# Patient Record
Sex: Male | Born: 1965 | Race: Black or African American | Hispanic: No | Marital: Single | Smoking: Never smoker
Health system: Southern US, Community
[De-identification: ages and names within clinical notes are randomized; demographics above are authoritative.]

## PROBLEM LIST (undated history)

## (undated) DIAGNOSIS — E669 Obesity, unspecified: Secondary | ICD-10-CM

## (undated) DIAGNOSIS — I10 Essential (primary) hypertension: Secondary | ICD-10-CM

## (undated) DIAGNOSIS — E785 Hyperlipidemia, unspecified: Secondary | ICD-10-CM

## (undated) DIAGNOSIS — N183 Chronic kidney disease, stage 3 unspecified: Secondary | ICD-10-CM

## (undated) DIAGNOSIS — I2699 Other pulmonary embolism without acute cor pulmonale: Secondary | ICD-10-CM

## (undated) HISTORY — DX: Chronic kidney disease, stage 3 unspecified: N18.30

## (undated) HISTORY — DX: Obesity, unspecified: E66.9

## (undated) HISTORY — PX: APPENDECTOMY: SHX54

## (undated) HISTORY — DX: Other pulmonary embolism without acute cor pulmonale: I26.99

## (undated) HISTORY — DX: Hyperlipidemia, unspecified: E78.5

---

## 2013-12-01 ENCOUNTER — Encounter (HOSPITAL_BASED_OUTPATIENT_CLINIC_OR_DEPARTMENT_OTHER): Payer: Self-pay | Admitting: Emergency Medicine

## 2013-12-01 ENCOUNTER — Emergency Department (HOSPITAL_BASED_OUTPATIENT_CLINIC_OR_DEPARTMENT_OTHER)
Admission: EM | Admit: 2013-12-01 | Discharge: 2013-12-02 | Disposition: A | Discharge status: Home / Self Care | Payer: BC Managed Care – PPO | Attending: Emergency Medicine | Admitting: Emergency Medicine

## 2013-12-01 DIAGNOSIS — Z79899 Other long term (current) drug therapy: Secondary | ICD-10-CM | POA: Insufficient documentation

## 2013-12-01 DIAGNOSIS — I10 Essential (primary) hypertension: Secondary | ICD-10-CM

## 2013-12-01 DIAGNOSIS — Z760 Encounter for issue of repeat prescription: Secondary | ICD-10-CM | POA: Insufficient documentation

## 2013-12-01 HISTORY — DX: Essential (primary) hypertension: I10

## 2013-12-01 MED ORDER — AMLODIPINE BESYLATE 10 MG PO TABS: 10.0000 mg | ORAL_TABLET | Freq: Every day | ORAL | Status: DC

## 2013-12-01 NOTE — ED Notes (Signed)
Pt c/o increased BP x 1 day. Pt is out of BP med

## 2013-12-01 NOTE — Discharge Instructions (Signed)
Arterial Hypertension °Arterial hypertension (high blood pressure) is a condition of elevated pressure in your blood vessels. Hypertension over a long period of time is a risk factor for strokes, heart attacks, and heart failure. It is also the leading cause of kidney (renal) failure.  °CAUSES  °· In Adults -- Over 90% of all hypertension has no known cause. This is called essential or primary hypertension. In the other 10% of people with hypertension, the increase in blood pressure is caused by another disorder. This is called secondary hypertension. Important causes of secondary hypertension are: °· Heavy alcohol use. °· Obstructive sleep apnea. °· Hyperaldosterosim (Conn's syndrome). °· Steroid use. °· Chronic kidney failure. °· Hyperparathyroidism. °· Medications. °· Renal artery stenosis. °· Pheochromocytoma. °· Cushing's disease. °· Coarctation of the aorta. °· Scleroderma renal crisis. °· Licorice (in excessive amounts). °· Drugs (cocaine, methamphetamine). °Your caregiver can explain any items above that apply to you. °· In Children -- Secondary hypertension is more common and should always be considered. °· Pregnancy -- Few women of childbearing age have high blood pressure. However, up to 10% of them develop hypertension of pregnancy. Generally, this will not harm the woman. It may be a sign of 3 complications of pregnancy: preeclampsia, HELLP syndrome, and eclampsia. Follow up and control with medication is necessary. °SYMPTOMS  °· This condition normally does not produce any noticeable symptoms. It is usually found during a routine exam. °· Malignant hypertension is a late problem of high blood pressure. It may have the following symptoms: °· Headaches. °· Blurred vision. °· End-organ damage (this means your kidneys, heart, lungs, and other organs are being damaged). °· Stressful situations can increase the blood pressure. If a person with normal blood pressure has their blood pressure go up while being  seen by their caregiver, this is often termed "white coat hypertension." Its importance is not known. It may be related with eventually developing hypertension or complications of hypertension. °· Hypertension is often confused with mental tension, stress, and anxiety. °DIAGNOSIS  °The diagnosis is made by 3 separate blood pressure measurements. They are taken at least 1 week apart from each other. If there is organ damage from hypertension, the diagnosis may be made without repeat measurements. °Hypertension is usually identified by having blood pressure readings: °· Above 140/90 mmHg measured in both arms, at 3 separate times, over a couple weeks. °· Over 130/80 mmHg should be considered a risk factor and may require treatment in patients with diabetes. °Blood pressure readings over 120/80 mmHg are called "pre-hypertension" even in non-diabetic patients. °To get a true blood pressure measurement, use the following guidelines. Be aware of the factors that can alter blood pressure readings. °· Take measurements at least 1 hour after caffeine. °· Take measurements 30 minutes after smoking and without any stress. This is another reason to quit smoking  it raises your blood pressure. °· Use a proper cuff size. Ask your caregiver if you are not sure about your cuff size. °· Most home blood pressure cuffs are automatic. They will measure systolic and diastolic pressures. The systolic pressure is the pressure reading at the start of sounds. Diastolic pressure is the pressure at which the sounds disappear. If you are elderly, measure pressures in multiple postures. Try sitting, lying or standing. °· Sit at rest for a minimum of 5 minutes before taking measurements. °· You should not be on any medications like decongestants. These are found in many cold medications. °· Record your blood pressure readings and review   them with your caregiver. °If you have hypertension: °· Your caregiver may do tests to be sure you do not have  secondary hypertension (see "causes" above). °· Your caregiver may also look for signs of metabolic syndrome. This is also called Syndrome X or Insulin Resistance Syndrome. You may have this syndrome if you have type 2 diabetes, abdominal obesity, and abnormal blood lipids in addition to hypertension. °· Your caregiver will take your medical and family history and perform a physical exam. °· Diagnostic tests may include blood tests (for glucose, cholesterol, potassium, and kidney function), a urinalysis, or an EKG. Other tests may also be necessary depending on your condition. °PREVENTION  °There are important lifestyle issues that you can adopt to reduce your chance of developing hypertension: °· Maintain a normal weight. °· Limit the amount of salt (sodium) in your diet. °· Exercise often. °· Limit alcohol intake. °· Get enough potassium in your diet. Discuss specific advice with your caregiver. °· Follow a DASH diet (dietary approaches to stop hypertension). This diet is rich in fruits, vegetables, and low-fat dairy products, and avoids certain fats. °PROGNOSIS  °Essential hypertension cannot be cured. Lifestyle changes and medical treatment can lower blood pressure and reduce complications. The prognosis of secondary hypertension depends on the underlying cause. Many people whose hypertension is controlled with medicine or lifestyle changes can live a normal, healthy life.  °RISKS AND COMPLICATIONS  °While high blood pressure alone is not an illness, it often requires treatment due to its short- and long-term effects on many organs. Hypertension increases your risk for: °· CVAs or strokes (cerebrovascular accident). °· Heart failure due to chronically high blood pressure (hypertensive cardiomyopathy). °· Heart attack (myocardial infarction). °· Damage to the retina (hypertensive retinopathy). °· Kidney failure (hypertensive nephropathy). °Your caregiver can explain list items above that apply to you. Treatment  of hypertension can significantly reduce the risk of complications. °TREATMENT  °· For overweight patients, weight loss and regular exercise are recommended. Physical fitness lowers blood pressure. °· Mild hypertension is usually treated with diet and exercise. A diet rich in fruits and vegetables, fat-free dairy products, and foods low in fat and salt (sodium) can help lower blood pressure. Decreasing salt intake decreases blood pressure in a 1/3 of people. °· Stop smoking if you are a smoker. °The steps above are highly effective in reducing blood pressure. While these actions are easy to suggest, they are difficult to achieve. Most patients with moderate or severe hypertension end up requiring medications to bring their blood pressure down to a normal level. There are several classes of medications for treatment. Blood pressure pills (antihypertensives) will lower blood pressure by their different actions. Lowering the blood pressure by 10 mmHg may decrease the risk of complications by as much as 25%. °The goal of treatment is effective blood pressure control. This will reduce your risk for complications. Your caregiver will help you determine the best treatment for you according to your lifestyle. What is excellent treatment for one person, may not be for you. °HOME CARE INSTRUCTIONS  °· Do not smoke. °· Follow the lifestyle changes outlined in the "Prevention" section. °· If you are on medications, follow the directions carefully. Blood pressure medications must be taken as prescribed. Skipping doses reduces their benefit. It also puts you at risk for problems. °· Follow up with your caregiver, as directed. °· If you are asked to monitor your blood pressure at home, follow the guidelines in the "Diagnosis" section above. °SEEK MEDICAL CARE   IF:  °· You think you are having medication side effects. °· You have recurrent headaches or lightheadedness. °· You have swelling in your ankles. °· You have trouble with  your vision. °SEEK IMMEDIATE MEDICAL CARE IF:  °· You have sudden onset of chest pain or pressure, difficulty breathing, or other symptoms of a heart attack. °· You have a severe headache. °· You have symptoms of a stroke (such as sudden weakness, difficulty speaking, difficulty walking). °MAKE SURE YOU:  °· Understand these instructions. °· Will watch your condition. °· Will get help right away if you are not doing well or get worse. °Document Released: 10/28/2005 Document Revised: 01/20/2012 Document Reviewed: 05/28/2007 °ExitCare® Patient Information ©2014 [employer]. ° ° ° °Emergency Department Resource Guide °1) Find a Doctor and Pay Out of Pocket °Although you won't have to find out who is covered by your insurance plan, it is a good idea to ask around and get recommendations. You will then need to call the office and see if the doctor you have chosen will accept you as a new patient and what types of options they offer for patients who are self-pay. Some doctors offer discounts or will set up payment plans for their patients who do not have insurance, but you will need to ask so you aren't surprised when you get to your appointment. ° °2) Contact Your Local Health Department °Not all health departments have doctors that can see patients for sick visits, but many do, so it is worth a call to see if yours does. If you don't know where your local health department is, you can check in your phone book. The CDC also has a tool to help you locate your state's health department, and many state websites also have listings of all of their local health departments. ° °3) Find a Walk-in Clinic °If your illness is not likely to be very severe or complicated, you may want to try a walk in clinic. These are popping up all over the country in pharmacies, drugstores, and shopping centers. They're usually staffed by nurse practitioners or physician assistants that have been trained to treat common illnesses and complaints.  They're usually fairly quick and inexpensive. However, if you have serious medical issues or chronic medical problems, these are probably not your best option. ° °No Primary Care Doctor: °- Call [facility] at  832-8000 - they can help you locate a primary care doctor that  accepts your insurance, provides certain services, etc. °- Physician Referral Service- 1-800-533-3463 ° °Chronic Pain Problems: °Organization         Address  Phone   Notes  °[facility]  (336) 297-2271 Patients need to be referred by their primary care doctor.  ° °Medication Assistance: °Organization         Address  Phone   Notes  °[place] [facility] [address] °[place], [place] [address] (336) 641-8030 --Must be a resident of [place] °-- Must have NO insurance coverage whatsoever (no Medicaid/ Medicare, etc.) °-- The pt. MUST have a primary care doctor that directs their care regularly and follows them in the community °  °[facility]  (866) 331-1348   °[employer]  (888) 892-1162   ° °Agencies that provide inexpensive medical care: °Organization         Address  Phone   Notes  °[facility]  (336) 832-8035   °[facility]    (336) 832-7272   °  [facility] [address] °[place], [place] [address] (336) 832-4777   °[facility] [address], °[place] (336) 271-4999   °[facility]    (336) 373-0678   °[facility]    (336) 272-1050   °[facility] ° [address], [place] Phone:  (336) 832-4444, Fax:  (336) 832-4440 Hours of Operation:  9 am - 6 pm, M-F.  Also accepts Medicaid/Medicare and self-pay.  °[facility] ° [address], [place] Phone: (336) [phone], Fax: (336) [phone]. Hours of Operation:  8:30 am - 5:30 pm, M-F.  Also accepts Medicaid and self-pay.  °[facility] [place] [address], [place]  Phone: (336) 878-6027   °[facility] [address], [place], [place] (336)723-1848, Ext. 123 Mondays & Thursdays: 7-9 AM.  First 15 patients are seen on a first come, first serve basis. °  ° °Medicaid-accepting [place] Providers: ° °Organization         Address  Phone   Notes  °[facility] [address], [place] (336) 641-2100 Also accepts self-pay patients.  °[facility] [address], [place] ° (336) 856-9996   °[facility] [address], [place] (336) 288-8857   °[facility] [address] [place] [address], [place] (336) 299-7000   °Veita Bland [address], [place]  ° (336) 373-1557 Only accepts [place] Access Medicaid patients after they have their name applied to their card.  ° °Self-Pay (no insurance) in [place]: ° °Organization         Address  Phone   Notes  °Sickle Cell Patients, [facility] [address], [place] (336) 832-1970   °[facility] [address], [place] (336) 832-4400   °[facility] ° [address], [place] (336) 992-4800   °[facility]/Dr. Osei-Bonsu ° [address] [place] [address], [place] or [address], [place] (336) [phone] Phone number for both [place] and [place] locations is the same.  °[facility] [address], [place] (336) 299-0000   °[facility] [address] [place] [address], [place] or [address] (336) 852-7530 °(336) 878-2260   °[facility] [address], [place] (336) 350-1642, phone; (336) 294-5005, fax Sees patients 1st and 3rd Saturday of every month.  Must not qualify for public or private insurance (i.e. Medicaid, Medicare, [place] Health Choice, Veterans' Benefits) • Household income should be no more than 200% of the poverty level •The clinic cannot  treat you if you are pregnant or think you are pregnant • Sexually transmitted diseases are not treated at the clinic.  ° ° °Dental Care: °Organization         Address  Phone  Notes  °[facility] [address],  (336) 641-6152 Accepts children up to age 21 who are enrolled in Medicaid or [place] Health Choice; pregnant women with a Medicaid card; and children who have applied for Medicaid or [place] Health Choice, but were declined, whose parents can pay a reduced fee at time of service.  °[facility] [place]  [address], [place] (336) 641-7733 Accepts children up to age 21 who   are enrolled in Medicaid or [place] Health Choice; pregnant women with a Medicaid card; and children who have applied for Medicaid or [place] Health Choice, but were declined, whose parents can pay a reduced fee at time of service.  °[facility] ° [address], [place] (336) 641-4533 Patients are seen by appointment only. Walk-ins are not accepted. [facility] will see patients 18 years of age and older. °Monday - Tuesday (8am-5pm) °Most Wednesdays (8:30-5pm) °$30 per visit, cash only  °[facility] ° [address], [place] (336) 641-4533 Patients are seen by appointment only. Walk-ins are not accepted. [facility] will see patients 18 years of age and older. °One Wednesday Evening (Monthly: Volunteer Based).  $30 per visit, cash only  °[facility]  (919) 537-3737 for adults; Children under age 4, call Graduate Pediatric Dentistry at ([phone]) [phone]. Children aged 4-14, please call ([phone]) [phone] to request a pediatric application. ° Dental services are provided in all areas of dental care including fillings, crowns and bridges, complete and partial dentures, implants, gum treatment, root canals, and extractions. Preventive care is also provided.  Treatment is provided to both adults and children. °Patients are selected via a lottery and there is often a waiting list. °  °[facility] [address], °[place] ° (336) 763-8833 [email or web address] °  °[facility] [address], [place], [place] (336)723-1848, Ext. 123 Second and Fourth Thursday of each month, opens at 6:30 AM; Clinic ends at 9 AM.  Patients are seen on a first-come first-served basis, and a limited number are seen during each clinic.  ° °[facility] ° [address], [place], [place] (336) 723-7904   Eligibility Requirements °You must have lived in [place], [place], or [place] counties for at least the last three months. °  You cannot be eligible for state or federal sponsored healthcare insurance, including Veterans Administration, Medicaid, or Medicare. °  You generally cannot be eligible for healthcare insurance through your employer.  °  How to apply: °Eligibility screenings are held every Tuesday and Wednesday afternoon from 1:00 pm until 4:00 pm. You do not need an appointment for the interview!  °[facility] [address], [place], [place] 336-631-2330   °[facility]  336-342-8273   °[facility]  336-703-3100   °[facility]  336-570-6415   ° °Behavioral Health Resources in the Community: °Intensive Outpatient Programs °Organization         Address  Phone  Notes  °[facility] [address], [place], [place] 336-878-6098   °[facility] [address], [place], [place] 336-832-9800   °[facility] [address], [place], [place] ° 336-882-2125   °[facility] [address],  °[place], [place] 1-800-853-5163 or 336-641-4981   °Substance Abuse Resources °Organization         Address  Phone  Notes  °[facility]  336-882-2125   °[facility]   336-784-9470   °[facility]  336-285-9073   °[facility]  336-845-3988   °Residential & Outpatient Substance Abuse Program  1-800-659-3381   °Psychological Services °Organization         Address  Phone  Notes  °[facility]  336- 832-9600   °[facility]  336- 378-7881   °[facility] [address],   [place] 1-800-853-5163 or 336-641-4981   ° °Mobile Crisis Teams °Organization         Address  Phone  Notes  °[facility], Mobile Crisis Care Unit  1-877-626-1772   °Assertive °[facility] ° [address] [place], [place] 336-834-9664   °Sharon DeEsch [address] °[place] 336-554-5454   ° °Self-Help/Support Groups °Organization         Address  Phone             Notes  °[facility] - variety of support groups  336- 373-1402 Call for more information  °Narcotics Anonymous (NA), [facility] [address], °[place]  2 meetings at this location  ° °Residential Treatment Programs °Organization         Address  Phone  Notes  °[facility] [address],    °[place]  1-866-801-8205   °[facility] ° [address], [place], [place] 704-293-8524   °[facility] [address], [place] 336-845-3988 Admissions: 8am-3pm M-F  °[facility] [address]    °[place], [place] 336-841-1104   °[facility] [address], [place], [place] 336-379-7146   °[facility] [address]  °[place], [place] 336-285-9073   °[facility] - Intensive Outpatient [address], [place], [place] 336-852-3033   °[facility] [address]  °[place], [place] 1-877-615-2722 or 336-784-9470   °[facility] [address] [place], [place] 336-227-7417 Accepts Medicaid  °[facility] [address]  ° [place] 1-800-659-3381 Substance  Abuse/Addiction Treatment  ° °[place] Behavioral Health Resources °Organization         Address  Phone  Notes  °[facility]  (888) 581-9988   °Julie Brannon, PhD [address] [place], [place]   (336) 349-5553 or (336) 951-0000   °[facility]   [address] °[place], [place] (336) 349-4454   °[facility] [address], [place], [place] (336) 342-8316 Insurance/Medicaid/sponsorship through [facility]  °[facility] [address]                                    [place], [place] (336) 342-8316 Therapy/tele-psych/case  °[facility] [address]  ° [place], [place] (336) 349-2233    °Dr. Arfeen  (336) 349-4544   °[facility] [place]  [employer] [place] [facility] 1) [address], [place] °2) [address], [place] °3)  [address], [place] (336) 349-3220 °(336) 342-7768 ° °(336) 342-8140   °[place] [facility] (336) 342-1394 or (336) 342-3537 (After Hours)    ° ° ° °

## 2013-12-01 NOTE — ED Provider Notes (Signed)
CSN: [id number]     Arrival date & time 12/01/13  2214 History  This chart was scribed for Andres Verge Smitty Cords, MD by Quintella Reichert, ED scribe.  This patient was seen in room MH12/MH12 and the patient's care was started at 11:53 PM.   Chief Complaint  Patient presents with  . Hypertension    Patient is a 48 y.o. male presenting with hypertension. The history is provided by the patient. No language interpreter was used.  Hypertension This is a chronic problem. The current episode started more than 1 week ago. The problem occurs constantly. The problem has not changed since onset.Pertinent negatives include no chest pain, no abdominal pain, no headaches and no shortness of breath. Nothing aggravates the symptoms. Nothing relieves the symptoms. Treatments tried: A few tablets of old BP medication this evening. The treatment provided no relief.    HPI Comments: Andres Roman is a 48 y.o. male who presents to the Emergency Department complaining of HTN.  Pt has h/o HTN and was taking amlodipine.  He has no PCP and has been out for several months.  He states he took a small amount of old BP medications this evening at around 6 PM but his BP did not go down.  BP on arrival is 188/111.   Past Medical History  Diagnosis Date  . Hypertension     Past Surgical History  Procedure Laterality Date  . Appendectomy      History reviewed. No pertinent family history.   History  Substance Use Topics  . Smoking status: Never Smoker   . Smokeless tobacco: Not on file  . Alcohol Use: No     Review of Systems  Respiratory: Negative for shortness of breath.   Cardiovascular: Negative for chest pain, palpitations and leg swelling.  Gastrointestinal: Negative for abdominal pain.  Neurological: Negative for headaches.  All other systems reviewed and are negative.     Allergies  Review of patient's allergies indicates no known allergies.  Home Medications   Current Outpatient Rx  Name   Route  Sig  Dispense  Refill  . amLODipine (NORVASC) 10 MG tablet   Oral   Take 10 mg by mouth daily.          BP 168/109  Pulse 82  Temp(Src) 98.1 F (36.7 C) (Oral)  Resp 16  Ht 5\' 6"  (1.676 m)  Wt 215 lb (97.523 kg)  BMI 34.72 kg/m2  SpO2 99%  Physical Exam  Nursing note and vitals reviewed. Constitutional: He is oriented to person, place, and time. He appears well-developed and well-nourished. No distress.  HENT:  Head: Normocephalic and atraumatic.  Mouth/Throat: Oropharynx is clear and moist.  Eyes: EOM are normal. Pupils are equal, round, and reactive to light.  Neck: Neck supple. No tracheal deviation present.  Cardiovascular: Normal rate, regular rhythm and intact distal pulses.   Pulmonary/Chest: Effort normal and breath sounds normal. No respiratory distress. He has no wheezes. He has no rales.  Abdominal: Soft. Bowel sounds are normal. There is no tenderness.  Musculoskeletal: Normal range of motion.  Neurological: He is alert and oriented to person, place, and time. He has normal strength and normal reflexes. No cranial nerve deficit.  Skin: Skin is warm and dry.  Psychiatric: He has a normal mood and affect. His behavior is normal.    ED Course  Procedures (including critical care time)  DIAGNOSTIC STUDIES: Oxygen Saturation is 99% on room air, normal by my interpretation.    COORDINATION OF  CARE: 11:56 PM-Discussed treatment plan which includes referral for PCP f/u with pt at bedside and pt agreed to plan.    Labs Review Labs Reviewed - No data to display  Imaging Review No results found.  EKG Interpretation   None       MDM  No diagnosis found. No symptoms will provide one month supply of meds but patient must find a PMD for ongoing care     I personally performed the services described in this documentation, which was scribed in my presence. The recorded information has been reviewed and is accurate.    Andres AweApril K Mallerie Blok-Rasch,  MD 12/02/13 [phone]

## 2013-12-24 ENCOUNTER — Ambulatory Visit: Payer: BC Managed Care – PPO | Admitting: Family Medicine

## 2013-12-30 ENCOUNTER — Encounter: Payer: Self-pay | Admitting: Family Medicine

## 2013-12-30 ENCOUNTER — Ambulatory Visit (INDEPENDENT_AMBULATORY_CARE_PROVIDER_SITE_OTHER): Payer: BC Managed Care – PPO | Admitting: Family Medicine

## 2013-12-30 VITALS — BP 151/101 | HR 81 | Ht 66.0 in | Wt 218.0 lb

## 2013-12-30 DIAGNOSIS — E785 Hyperlipidemia, unspecified: Secondary | ICD-10-CM

## 2013-12-30 DIAGNOSIS — R5381 Other malaise: Secondary | ICD-10-CM

## 2013-12-30 DIAGNOSIS — R5383 Other fatigue: Secondary | ICD-10-CM

## 2013-12-30 DIAGNOSIS — Z125 Encounter for screening for malignant neoplasm of prostate: Secondary | ICD-10-CM

## 2013-12-30 DIAGNOSIS — E559 Vitamin D deficiency, unspecified: Secondary | ICD-10-CM

## 2013-12-30 DIAGNOSIS — I10 Essential (primary) hypertension: Secondary | ICD-10-CM

## 2013-12-30 LAB — COMPLETE METABOLIC PANEL WITH GFR
ALT: 31 U/L (ref 0–53)
AST: 30 U/L (ref 0–37)
Albumin: 4.5 g/dL (ref 3.5–5.2)
Alkaline Phosphatase: 65 U/L (ref 39–117)
BUN: 14 mg/dL (ref 6–23)
CO2: 25 mEq/L (ref 19–32)
Calcium: 9.4 mg/dL (ref 8.4–10.5)
Chloride: 102 mEq/L (ref 96–112)
Creat: 0.89 mg/dL (ref 0.50–1.35)
GFR, Est African American: >89 mL/min
GFR, Est Non African American: >89 mL/min
Glucose, Bld: 79 mg/dL (ref 70–99)
Potassium: 3.6 mEq/L (ref 3.5–5.3)
Sodium: 138 mEq/L (ref 135–145)
Total Bilirubin: 1 mg/dL (ref 0.2–1.2)
Total Protein: 8.1 g/dL (ref 6.0–8.3)

## 2013-12-30 LAB — CBC WITH DIFFERENTIAL/PLATELET
Basophils Absolute: 0.1 10*3/uL (ref 0.0–0.1)
Basophils Relative: 1 % (ref 0–1)
Eosinophils Absolute: 0.3 10*3/uL (ref 0.0–0.7)
Eosinophils Relative: 5 % (ref 0–5)
HCT: 44.8 % (ref 39.0–52.0)
Hemoglobin: 15.2 g/dL (ref 13.0–17.0)
Lymphocytes Relative: 36 % (ref 12–46)
Lymphs Abs: 2.3 10*3/uL (ref 0.7–4.0)
MCH: 27.4 pg (ref 26.0–34.0)
MCHC: 33.9 g/dL (ref 30.0–36.0)
MCV: 80.7 fL (ref 78.0–100.0)
Monocytes Absolute: 0.6 10*3/uL (ref 0.1–1.0)
Monocytes Relative: 10 % (ref 3–12)
Neutro Abs: 3 10*3/uL (ref 1.7–7.7)
Neutrophils Relative %: 48 % (ref 43–77)
Platelets: 302 10*3/uL (ref 150–400)
RBC: 5.55 MIL/uL (ref 4.22–5.81)
RDW: 14.3 % (ref 11.5–15.5)
WBC: 6.3 10*3/uL (ref 4.0–10.5)

## 2013-12-30 LAB — TSH: TSH: 1.038 u[IU]/mL (ref 0.350–4.500)

## 2013-12-30 LAB — LIPID PANEL
Cholesterol: 256 mg/dL — ABNORMAL HIGH (ref 0–200)
HDL: 45 mg/dL (ref >39)
LDL Cholesterol: 184 mg/dL — ABNORMAL HIGH (ref 0–99)
Total CHOL/HDL Ratio: 5.7 Ratio
Triglycerides: 133 mg/dL (ref <150)
VLDL: 27 mg/dL (ref 0–40)

## 2013-12-30 LAB — PSA: PSA: 1.52 ng/mL (ref <=4.00)

## 2013-12-30 LAB — MICROALBUMIN, URINE: Microalb, Ur: 15.13 mg/dL — ABNORMAL HIGH (ref 0.00–1.89)

## 2013-12-30 MED ORDER — AMLODIPINE BESYLATE 10 MG PO TABS: 10.0000 mg | ORAL_TABLET | Freq: Every day | ORAL | Status: DC

## 2013-12-30 MED ORDER — HYDROCHLOROTHIAZIDE 25 MG PO TABS: 25.0000 mg | ORAL_TABLET | Freq: Every day | ORAL | Status: DC

## 2013-12-30 NOTE — Progress Notes (Signed)
Subjective:    Patient ID: Andres Roman, male    DOB: 1966-09-18, 48 y.o.   MRN: [id number]  HPI  Andres Roman is here today to establish care with our practice.  He found our name in the Lobby of the [facility].  [facility] [place].  He has been on both BP and cholesterol medication in the past. Recently, he checked his BP on his wife's BP machine and noted that it was very high.  This concerned him so he went to the ED to be evaluated.  He was given amlodipine 10 mg and was told to establish care with a PCP.  He does not have any other medical concerns today.     Review of Systems  Constitutional: Negative for fatigue and unexpected weight change.  HENT: Negative.   Eyes: Negative for visual disturbance.  Respiratory: Negative for shortness of breath.   Cardiovascular: Negative for chest pain, palpitations and leg swelling.  Gastrointestinal: Negative.   Genitourinary: Negative.   Musculoskeletal: Negative for myalgias.  Skin: Negative.   Neurological: Negative.  Negative for light-headedness and headaches.  Psychiatric/Behavioral: Negative.      Past Medical History  Diagnosis Date  . Hypertension   . Hyperlipidemia   . Obese      Past Surgical History  Procedure Laterality Date  . 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     Hobbies:  Basketball                   Family History  Problem Relation Age of Onset  . Diabetes Mother   . Hypertension Mother   . Stroke Mother   .  Hypertension Brother   . Diabetes Brother   . Heart disease Paternal Grandmother     No Known Allergies      Objective:   Physical Exam  Constitutional: He is oriented to person, place, and time. He appears well-nourished. No distress.  HENT:  Head: Normocephalic.  Eyes: No scleral icterus.  Neck: Neck supple. No thyromegaly present.  Cardiovascular: Normal rate, regular rhythm and normal heart sounds.  Exam reveals no gallop and no friction rub.   No murmur heard. Pulmonary/Chest: Breath sounds normal. No respiratory distress. He exhibits no tenderness.  Musculoskeletal: He exhibits no edema.  Neurological: He is alert and oriented to person, place, and time.  Skin: Skin is warm and dry. No rash noted.  Psychiatric: He has a normal mood and affect. His behavior is normal. Judgment and thought content normal.      Assessment & Plan:    Andres Roman was seen today for hypertension.  Diagnoses and associated orders for this visit:  Essential hypertension, benign - amLODipine (NORVASC) 10 MG tablet; Take 1 tablet (10 mg total) by mouth daily. - hydrochlorothiazide (HYDRODIURIL) 25 MG tablet; Take 1 tablet (25 mg total) by mouth daily. - Microalbumin, urine  Other and unspecified hyperlipidemia - COMPLETE METABOLIC PANEL WITH GFR - Lipid panel  Screening PSA (prostate specific antigen) - PSA  Other malaise and fatigue - CBC w/Diff - TSH  Unspecified vitamin D deficiency - Vit D  25 hydroxy (rtn osteoporosis monitoring)   TIME SPENT "FACE TO FACE" WITH PATIENT -  30 MINS

## 2013-12-30 NOTE — Patient Instructions (Signed)
1)  Blood Pressure - Take the combination of amlodipine 10 mg and HCTZ 25 mg first thing in the morning.  Things you can do to limit BP would include DASH Diet and limiting your sodium intake.     DASH Diet The DASH diet stands for "Dietary Approaches to Stop Hypertension." It is a healthy eating plan that has been shown to reduce high blood pressure (hypertension) in as little as 14 days, while also possibly providing other significant health benefits. These other health benefits include reducing the risk of breast cancer after menopause and reducing the risk of type 2 diabetes, heart disease, colon cancer, and stroke. Health benefits also include weight loss and slowing kidney failure in patients with chronic kidney disease.  DIET GUIDELINES  Limit salt (sodium). Your diet should contain less than 1500 mg of sodium daily.  Limit refined or processed carbohydrates. Your diet should include mostly whole grains. Desserts and added sugars should be used sparingly.  Include small amounts of heart-healthy fats. These types of fats include nuts, oils, and tub margarine. Limit saturated and trans fats. These fats have been shown to be harmful in the body. CHOOSING FOODS  The following food groups are based on a 2000 calorie diet. See your Registered Dietitian for individual calorie needs. Grains and Grain Products (6 to 8 servings daily)  Eat More Often: Whole-wheat bread, brown rice, whole-grain or wheat pasta, quinoa, popcorn without added fat or salt (air popped).  Eat Less Often: White bread, white pasta, white rice, cornbread. Vegetables (4 to 5 servings daily)  Eat More Often: Fresh, frozen, and canned vegetables. Vegetables may be raw, steamed, roasted, or grilled with a minimal amount of fat.  Eat Less Often/Avoid: Creamed or fried vegetables. Vegetables in a cheese sauce. Fruit (4 to 5 servings daily)  Eat More Often: All fresh, canned (in natural juice), or frozen fruits. Dried fruits  without added sugar. One hundred percent fruit juice ( cup [237 mL] daily).  Eat Less Often: Dried fruits with added sugar. Canned fruit in light or heavy syrup. [employer], Fish, and Poultry (2 servings or less daily. One serving is 3 to 4 oz [85-114 g]).  Eat More Often: Ninety percent or leaner ground beef, tenderloin, sirloin. Round cuts of beef, chicken breast, [place] breast. All fish. Grill, bake, or broil your meat. Nothing should be fried.  Eat Less Often/Avoid: Fatty cuts of meat, [place], or chicken leg, thigh, or wing. Fried cuts of meat or fish. Dairy (2 to 3 servings)  Eat More Often: Low-fat or fat-free milk, low-fat plain or light yogurt, reduced-fat or part-skim cheese.  Eat Less Often/Avoid: Milk (whole, 2%).Whole milk yogurt. Full-fat cheeses. Nuts, Seeds, and Legumes (4 to 5 servings per week)  Eat More Often: All without added salt.  Eat Less Often/Avoid: Salted nuts and seeds, canned beans with added salt. Fats and Sweets (limited)  Eat More Often: Vegetable oils, tub margarines without trans fats, sugar-free gelatin. Mayonnaise and salad dressings.  Eat Less Often/Avoid: Coconut oils, palm oils, butter, stick margarine, cream, half and half, cookies, candy, pie. FOR MORE INFORMATION The Dash Diet Eating Plan: www.dashdiet.org Document Released: 10/17/2011 Document Revised: 01/20/2012 Document Reviewed: 10/17/2011 [facility] Patient Information 2014 [place], [place].  3 to 4 Gram Sodium Diet, No Added Salt (NAS) A 3 to 4 gram sodium diet restricts the amount of sodium in the diet to no more than 3 to 4 g or 3000 to 4000 mg daily. Limiting the amount of sodium is  often used to help lower blood pressure. It is important if you have heart, liver, or kidney problems. Many foods contain sodium for flavor and sometimes as a preservative. When the amount of sodium in a diet needs to be low, it is important to know what to look for when choosing foods and drinks. The following  includes some information and guidelines to help make it easier for you to adapt to a low sodium diet. QUICK TIPS  Do not add salt to food.  Avoid convenience items and fast food.  Choose unsalted snack foods.  Buy lower sodium products, often labeled as "lower sodium" or "no salt added."  Check food labels to learn how much sodium is in 1 serving.  When eating at a restaurant, ask that your food be prepared with less salt or none, if possible. READING FOOD LABELS FOR SODIUM INFORMATION The nutrition facts label is a good place to find how much sodium is in foods. Look for products with no more than 500 to 600 mg of sodium per meal and no more than 150 mg per serving. Remember that 3 to 4 g = 3000 to 4000 mg. The food label may also list foods as:  Sodium-free: Less than 5 mg in a serving.  Very low sodium: 35 mg or less in a serving.  Low-sodium: 140 mg or less in a serving.  Light in sodium: 50% less sodium in a serving. For example, if a food that usually has 300 mg of sodium is changed to become light in sodium, it will have 150 mg of sodium.  Reduced sodium: 25% less sodium in a serving. For example, if a food that usually has 400 mg of sodium is changed to reduced sodium, it will have 300 mg of sodium. CHOOSING FOODS Grains  Avoid: Salted crackers and snack items. Bread stuffing and biscuit mixes. Seasoned rice or pasta mixes.  Choose: Unsalted snack items. English muffins, breads, and rolls. Homemade pancakes and waffles. Most cereals. Pasta. Meats  Avoid: Salted, canned, smoked, spiced, pickled meats, including fish and poultry. Bacon, ham, sausage, cold cuts, hot dogs, anchovies.  Choose: Low-sodium canned tuna and salmon. Fresh or frozen meat, poultry, and fish. Dairy  Avoid: Processed cheese and spreads. Cottage cheese. Buttermilk and condensed milk. Regular cheese.  Choose: Milk. Low-sodium cottage cheese. Yogurt. Sour cream. Low-sodium cheese. Fruits and  Vegetables  Avoid: Regular canned vegetables. Regular canned tomato sauce and paste. Frozen vegetables in sauces. Olives. Rosita FirePickles. Relishes. Sauerkraut.  Choose: Low-sodium canned vegetables. Low-sodium tomato sauce and paste. Frozen or fresh vegetables. Fresh and frozen fruit. Condiments  Avoid: Canned and packaged gravies. Worcestershire sauce. Tartar sauce. Barbecue sauce. Soy sauce. Steak sauce. Ketchup. Onion, garlic, and table salt. Meat flavorings and tenderizers.  Choose: Fresh and dried herbs and spices. Low-sodium varieties of mustard and ketchup. Lemon juice. Tabasco sauce. Horseradish. SAMPLE 3 TO 4 GRAM SODIUM MEAL PLAN  Breakfast / Sodium (mg)  1 cup low-fat milk / 143 mg  2 slices whole-wheat toast / 270 mg  1 tbs heart-healthy margarine / 153 mg  1 hard-boiled egg / 139 mg Lunch / Sodium (mg)  1 cup raw carrots / 76 mg   cup hummus / 298 mg  1 cup low-fat milk / 143 mg   cup red grapes / 2 mg  1 cup low-sodium chicken and rice soup / 480 mg  10 low-sodium saltine crackers / 191 mg Dinner / Sodium (mg)  1 cup whole-wheat pasta / 2  mg  1 cup tomato sauce / 1178 mg  3 oz lean ground beef / 57 mg  1 small side salad (1 cup raw spinach leaves,  cup cucumber,  cup yellow bell pepper) / 25 mg  1 tsp ranch dressing / 144 mg Snack / Sodium (mg)  1 slice cheddar cheese / 258 mg  1 medium apple / 1 mg Nutrient Analysis  Calories: 2005  Protein: 85 g  Carbohydrate: 245 g  Fat: 78 g  Sodium: 3560 mg Document Released: 10/28/2005 Document Revised: 01/20/2012 Document Reviewed: 01/29/2010 [employer] Patient Information 2014 [place], [place].

## 2013-12-31 LAB — VITAMIN D 25 HYDROXY (VIT D DEFICIENCY, FRACTURES): Vit D, 25-Hydroxy: 22 ng/mL — ABNORMAL LOW (ref 30–89)

## 2014-01-07 ENCOUNTER — Ambulatory Visit: Payer: BC Managed Care – PPO | Admitting: Family Medicine

## 2014-01-14 ENCOUNTER — Ambulatory Visit: Payer: BC Managed Care – PPO | Admitting: Family Medicine

## 2014-01-30 DIAGNOSIS — R5383 Other fatigue: Secondary | ICD-10-CM

## 2014-01-30 DIAGNOSIS — I10 Essential (primary) hypertension: Secondary | ICD-10-CM | POA: Insufficient documentation

## 2014-01-30 DIAGNOSIS — E785 Hyperlipidemia, unspecified: Secondary | ICD-10-CM | POA: Insufficient documentation

## 2014-01-30 DIAGNOSIS — E559 Vitamin D deficiency, unspecified: Secondary | ICD-10-CM | POA: Insufficient documentation

## 2014-01-30 DIAGNOSIS — R5381 Other malaise: Secondary | ICD-10-CM | POA: Insufficient documentation

## 2014-02-01 ENCOUNTER — Encounter: Payer: Self-pay | Admitting: Family Medicine

## 2014-02-01 ENCOUNTER — Encounter (INDEPENDENT_AMBULATORY_CARE_PROVIDER_SITE_OTHER): Payer: Self-pay

## 2014-02-01 ENCOUNTER — Ambulatory Visit (INDEPENDENT_AMBULATORY_CARE_PROVIDER_SITE_OTHER): Payer: BC Managed Care – PPO | Admitting: Family Medicine

## 2014-02-01 VITALS — BP 136/97 | HR 76 | Resp 16 | Ht 66.0 in | Wt 220.0 lb

## 2014-02-01 DIAGNOSIS — E559 Vitamin D deficiency, unspecified: Secondary | ICD-10-CM

## 2014-02-01 DIAGNOSIS — J309 Allergic rhinitis, unspecified: Secondary | ICD-10-CM

## 2014-02-01 DIAGNOSIS — E785 Hyperlipidemia, unspecified: Secondary | ICD-10-CM

## 2014-02-01 DIAGNOSIS — R809 Proteinuria, unspecified: Secondary | ICD-10-CM

## 2014-02-01 MED ORDER — MONTELUKAST SODIUM 10 MG PO TABS: 10.0000 mg | ORAL_TABLET | Freq: Every day | ORAL | Status: DC

## 2014-02-01 MED ORDER — AZELASTINE-FLUTICASONE 137-50 MCG/ACT NA SUSP: 1.0000 | Freq: Two times a day (BID) | NASAL | Status: DC

## 2014-02-01 MED ORDER — ROSUVASTATIN CALCIUM 20 MG PO TABS: 20.0000 mg | ORAL_TABLET | Freq: Every day | ORAL | Status: DC

## 2014-02-01 MED ORDER — VITAMIN D (ERGOCALCIFEROL) 1.25 MG (50000 UNIT) PO CAPS: ORAL_CAPSULE | ORAL | Status: AC

## 2014-02-01 MED ORDER — MOMETASONE FUROATE 50 MCG/ACT NA SUSP: 2.0000 | Freq: Every day | NASAL | Status: DC

## 2014-02-01 NOTE — Patient Instructions (Addendum)
1)  BP - It is better on the combination of the amlodipine and HCTZ but it still needs to be a little lower.  My goal for your BP is 120-135/70-85. I want you to work harder on your diet and exercise which will help get you to this range.    2)  Cholesterol - Your LDL (bad cholesterol) is very high so we need to lower this. Start with Crestor 5 mg for a month then increase to 10 mg and finally 20 mg.  We will recheck your labs in 3 months.   3)  Vitamin D - Take 50,000 IU of Vitamin D twice a week.    4)  Allergies - Rinse out your head with the Lloyd Huger Med Sinus Rinse (Distilled Water + Blue Packet) then spray the Nasonex OR the Dymista as directed.  Take a Zyrtec 10 mg at night or Allegra 180 mg or Claritin 10 mg in the morning.  If you continue to have trouble, you can also add Singulair.    Managing Your High Blood Pressure Blood pressure is a measurement of how forceful your blood is pressing against the walls of the arteries. Arteries are muscular tubes within the circulatory system. Blood pressure does not stay the same. Blood pressure rises when you are active, excited, or nervous; and it lowers during sleep and relaxation. If the numbers measuring your blood pressure stay above normal most of the time, you are at risk for health problems. High blood pressure (hypertension) is a long-term (chronic) condition in which blood pressure is elevated. A blood pressure reading is recorded as two numbers, such as 120 over 80 (or 120/80). The first, higher number is called the systolic pressure. It is a measure of the pressure in your arteries as the heart beats. The second, lower number is called the diastolic pressure. It is a measure of the pressure in your arteries as the heart relaxes between beats.  Keeping your blood pressure in a normal range is important to your overall health and prevention of health problems, such as heart disease and stroke. When your blood pressure is uncontrolled, your heart has  to work harder than normal. High blood pressure is a very common condition in adults because blood pressure tends to rise with age. Men and women are equally likely to have hypertension but at different times in life. Before age 37, men are more likely to have hypertension. After 48 years of age, women are more likely to have it. Hypertension is especially common in African Americans. This condition often has no signs or symptoms. The cause of the condition is usually not known. Your caregiver can help you come up with a plan to keep your blood pressure in a normal, healthy range. BLOOD PRESSURE STAGES Blood pressure is classified into four stages: normal, prehypertension, stage 1, and stage 2. Your blood pressure reading will be used to determine what type of treatment, if any, is necessary. Appropriate treatment options are tied to these four stages:  Normal  Systolic pressure (mm Hg): below 120.  Diastolic pressure (mm Hg): below 80. Prehypertension  Systolic pressure (mm Hg): 120 to 139.  Diastolic pressure (mm Hg): 80 to 89. Stage1  Systolic pressure (mm Hg): 140 to 159.  Diastolic pressure (mm Hg): 90 to 99. Stage2  Systolic pressure (mm Hg): 160 or above.  Diastolic pressure (mm Hg): 100 or above. RISKS RELATED TO HIGH BLOOD PRESSURE Managing your blood pressure is an important responsibility. Uncontrolled high blood  pressure can lead to:  A heart attack.  A stroke.  A weakened blood vessel (aneurysm).  Heart failure.  Kidney damage.  Eye damage.  Metabolic syndrome.  Memory and concentration problems. HOW TO MANAGE YOUR BLOOD PRESSURE Blood pressure can be managed effectively with lifestyle changes and medicines (if needed). Your caregiver will help you come up with a plan to bring your blood pressure within a normal range. Your plan should include the following: Education  Read all information provided by your caregivers about how to control blood  pressure.  Educate yourself on the latest guidelines and treatment recommendations. New research is always being done to further define the risks and treatments for high blood pressure. Lifestylechanges  Control your weight.  Avoid smoking.  Stay physically active.  Reduce the amount of salt in your diet.  Reduce stress.  Control any chronic conditions, such as high cholesterol or diabetes.  Reduce your alcohol intake. Medicines  Several medicines (antihypertensive medicines) are available, if needed, to bring blood pressure within a normal range. Communication  Review all the medicines you take with your caregiver because there may be side effects or interactions.  Talk with your caregiver about your diet, exercise habits, and other lifestyle factors that may be contributing to high blood pressure.  See your caregiver regularly. Your caregiver can help you create and adjust your plan for managing high blood pressure. RECOMMENDATIONS FOR TREATMENT AND FOLLOW-UP  The following recommendations are based on current guidelines for managing high blood pressure in nonpregnant adults. Use these recommendations to identify the proper follow-up period or treatment option based on your blood pressure reading. You can discuss these options with your caregiver.  Systolic pressure of 120 to 139 or diastolic pressure of 80 to 89: Follow up with your caregiver as directed.  Systolic pressure of 140 to 160 or diastolic pressure of 90 to 100: Follow up with your caregiver within 2 months.  Systolic pressure above 160 or diastolic pressure above 100: Follow up with your caregiver within 1 month.  Systolic pressure above 180 or diastolic pressure above 110: Consider antihypertensive therapy; follow up with your caregiver within 1 week.  Systolic pressure above 200 or diastolic pressure above 120: Begin antihypertensive therapy; follow up with your caregiver within 1 week. Document Released:  07/22/2012 Document Reviewed: 07/22/2012 [facility] Patient Information 2014 [place], [place].

## 2014-02-01 NOTE — Progress Notes (Signed)
Subjective:    Patient ID: Andres Roman, male    DOB: Mar 28, 1966, 48 y.o.   MRN: [id number]  HPI  Andres Roman is here today to go over his most recent lab results and to discuss the conditions listed below:    1)  Hypertension -  His BP has improved some since his last visit on the combination of amlodipine and HCTZ.    2)  Allergic Rhinitis - He has been struggling with a lot of nasal congestion.  He has not taken anything for his symptoms.    3)  Hyperlipidemia - He has never taken anything for his cholesterol.      Review of Systems  Constitutional: Negative for fatigue and unexpected weight change.  HENT: Positive for congestion and rhinorrhea.   Respiratory: Negative for shortness of breath.   Cardiovascular: Negative for chest pain, palpitations and leg swelling.  Gastrointestinal: Negative.   Genitourinary: Negative.   Musculoskeletal: Negative for myalgias.  Skin: Negative.   Neurological: Negative.   Psychiatric/Behavioral: Negative.      Past Medical History  Diagnosis Date  . Hypertension   . Hyperlipidemia   . Obese      Past Surgical History  Procedure Laterality Date  . 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     Hobbies:  Basketball                   Family History  Problem Relation Age of Onset  . Diabetes Mother   . Hypertension Mother   . Stroke Mother   . Hypertension Brother   . Diabetes Brother   . Heart disease Paternal Grandmother      Current Outpatient Prescriptions on File Prior to Visit  Medication Sig Dispense  Refill  . amLODipine (NORVASC) 10 MG tablet Take 1 tablet (10 mg total) by mouth daily.  90 tablet  3  . hydrochlorothiazide (HYDRODIURIL) 25 MG tablet Take 1 tablet (25 mg total) by mouth daily.  90 tablet  3   No current facility-administered medications on file prior to visit.     No Known Allergies      Objective:   Physical Exam  Constitutional: He is oriented to person, place, and time. He appears well-nourished. No distress.  HENT:  Head: Normocephalic.  Eyes: No scleral icterus.  Neck: Neck supple. No thyromegaly present.  Cardiovascular: Normal rate, regular rhythm and normal heart sounds.  Exam reveals no gallop and no friction rub.   No murmur heard. Pulmonary/Chest: Breath sounds normal. No respiratory distress. He exhibits no tenderness.  Musculoskeletal: He exhibits no edema.  Neurological: He is alert and oriented to person, place, and time.  Skin: Skin is warm and dry. No rash noted.  Psychiatric: He has a normal mood and affect. His behavior is normal. Judgment and thought content normal.       Assessment & Plan:    Andres Roman was seen today for medication management.  Diagnoses  and associated orders for this visit:  Other and unspecified hyperlipidemia Comments: He is going to start on Crestor 20 mg.   - rosuvastatin (CRESTOR) 20 MG tablet; Take 1 tablet (20 mg total) by mouth daily. - COMPLETE METABOLIC PANEL WITH GFR; Future - Lipid panel; Future  Allergic rhinitis Comments: He is going to try Nasonex vs Dymista and Singlair.   - mometasone (NASONEX) 50 MCG/ACT nasal spray; Place 2 sprays into the nose daily. - Azelastine-Fluticasone (DYMISTA) 137-50 MCG/ACT SUSP; Place 1 spray into the nose 2 (two) times daily. - montelukast (SINGULAIR) 10 MG tablet; Take 1 tablet (10 mg total) by mouth at bedtime.  Microalbuminuria Comments: This should improve with improvement of his BP.   - Microalbumin, urine; Future  Unspecified vitamin D deficiency Comments:  He is going to start on Vitamin D 50,000 twice a week.   - Vitamin D, Ergocalciferol, (DRISDOL) 50000 UNITS CAPS capsule; Take 1 capsule twice a week (Sun/Wed)   TIME SPENT "FACE TO FACE" WITH PATIENT -  30 MINS

## 2014-04-09 DIAGNOSIS — J309 Allergic rhinitis, unspecified: Secondary | ICD-10-CM | POA: Insufficient documentation

## 2014-04-09 DIAGNOSIS — R809 Proteinuria, unspecified: Secondary | ICD-10-CM | POA: Insufficient documentation

## 2014-05-10 ENCOUNTER — Ambulatory Visit: Payer: BC Managed Care – PPO | Admitting: Family Medicine

## 2014-05-17 ENCOUNTER — Ambulatory Visit: Payer: BC Managed Care – PPO | Admitting: Family Medicine

## 2014-06-02 ENCOUNTER — Ambulatory Visit: Payer: BC Managed Care – PPO | Admitting: Family Medicine

## 2016-03-10 ENCOUNTER — Encounter (HOSPITAL_BASED_OUTPATIENT_CLINIC_OR_DEPARTMENT_OTHER): Payer: Self-pay | Admitting: *Deleted

## 2016-03-10 ENCOUNTER — Emergency Department (HOSPITAL_BASED_OUTPATIENT_CLINIC_OR_DEPARTMENT_OTHER)
Admission: EM | Admit: 2016-03-10 | Discharge: 2016-03-10 | Disposition: A | Discharge status: Home / Self Care | Payer: BLUE CROSS/BLUE SHIELD | Attending: Emergency Medicine | Admitting: Emergency Medicine

## 2016-03-10 DIAGNOSIS — Y929 Unspecified place or not applicable: Secondary | ICD-10-CM | POA: Insufficient documentation

## 2016-03-10 DIAGNOSIS — Y999 Unspecified external cause status: Secondary | ICD-10-CM | POA: Insufficient documentation

## 2016-03-10 DIAGNOSIS — X58XXXA Exposure to other specified factors, initial encounter: Secondary | ICD-10-CM | POA: Insufficient documentation

## 2016-03-10 DIAGNOSIS — E669 Obesity, unspecified: Secondary | ICD-10-CM | POA: Diagnosis not present

## 2016-03-10 DIAGNOSIS — T18128A Food in esophagus causing other injury, initial encounter: Secondary | ICD-10-CM

## 2016-03-10 DIAGNOSIS — Y9389 Activity, other specified: Secondary | ICD-10-CM | POA: Insufficient documentation

## 2016-03-10 DIAGNOSIS — I10 Essential (primary) hypertension: Secondary | ICD-10-CM | POA: Diagnosis not present

## 2016-03-10 DIAGNOSIS — T189XXA Foreign body of alimentary tract, part unspecified, initial encounter: Secondary | ICD-10-CM | POA: Insufficient documentation

## 2016-03-10 DIAGNOSIS — E785 Hyperlipidemia, unspecified: Secondary | ICD-10-CM | POA: Diagnosis not present

## 2016-03-10 MED ORDER — GLUCAGON HCL RDNA (DIAGNOSTIC) 1 MG IJ SOLR
1.0000 mg | Freq: Once | INTRAMUSCULAR | Status: DC
  Filled 2016-03-10: qty 1

## 2016-03-10 MED ORDER — ONDANSETRON 4 MG PO TBDP
4.0000 mg | ORAL_TABLET | Freq: Once | ORAL | Status: AC
  Administered 2016-03-10: 4 mg via ORAL
  Filled 2016-03-10: qty 1

## 2016-03-10 MED ORDER — PANTOPRAZOLE SODIUM 20 MG PO TBEC: 40.0000 mg | DELAYED_RELEASE_TABLET | Freq: Every day | ORAL | Status: DC

## 2016-03-10 NOTE — ED Provider Notes (Signed)
CSN: [id number]     Arrival date & time 03/10/16  1217 History   First MD Initiated Contact with Patient 03/10/16 1240     Chief Complaint  Patient presents with  . Food in throat      (Consider location/radiation/quality/duration/timing/severity/associated sxs/prior Treatment) HPI Comments: 50 year old male with a history of hypertension and hyperlipidemia presents with concern for food impaction. Patient reports he was eating a piece of chicken about 1 hour prior to arrival, when he felt it get stuck, and initially had difficulty swallowing, was spitting, and unable to tolerate any fluids.  Patient is a 50 y.o. male presenting with foreign body swallowed.  Swallowed Foreign Body This is a new problem. The current episode started 1 to 2 hours ago. The problem occurs constantly. The problem has not changed since onset.Pertinent negatives include no chest pain, no abdominal pain, no headaches and no shortness of breath. Nothing aggravates the symptoms. Nothing relieves the symptoms. He has tried nothing for the symptoms. The treatment provided no relief.    Past Medical History  Diagnosis Date  . Hypertension   . Hyperlipidemia   . Obese    Past Surgical History  Procedure Laterality Date  . Appendectomy     Family History  Problem Relation Age of Onset  . Diabetes Mother   . Hypertension Mother   . Stroke Mother   . Hypertension Brother   . Diabetes Brother   . Heart disease Paternal Grandmother    Social History  Substance Use Topics  . Smoking status: Never Smoker   . Smokeless tobacco: Never Used  . Alcohol Use: No    Review of Systems  Constitutional: Negative for fever.  HENT: Negative for sore throat.   Eyes: Negative for visual disturbance.  Respiratory: Negative for shortness of breath.   Cardiovascular: Negative for chest pain.  Gastrointestinal: Negative for nausea, vomiting, abdominal pain and diarrhea.  Genitourinary: Negative for difficulty urinating.   Musculoskeletal: Negative for back pain and neck stiffness.  Skin: Negative for rash.  Neurological: Negative for syncope and headaches.      Allergies  Review of patient's allergies indicates no known allergies.  Home Medications   Prior to Admission medications   Medication Sig Start Date End Date Taking? Authorizing Provider  amLODipine (NORVASC) 10 MG tablet Take 1 tablet (10 mg total) by mouth daily. 12/30/13   Gillian Scarce, MD  Azelastine-Fluticasone (DYMISTA) 137-50 MCG/ACT SUSP Place 1 spray into the nose 2 (two) times daily. 02/01/14 02/02/15  Gillian Scarce, MD  hydrochlorothiazide (HYDRODIURIL) 25 MG tablet Take 1 tablet (25 mg total) by mouth daily. 12/30/13 12/30/14  Gillian Scarce, MD  mometasone (NASONEX) 50 MCG/ACT nasal spray Place 2 sprays into the nose daily. 02/01/14 02/02/15  Gillian Scarce, MD  montelukast (SINGULAIR) 10 MG tablet Take 1 tablet (10 mg total) by mouth at bedtime. 02/01/14 02/02/15  Gillian Scarce, MD  pantoprazole (PROTONIX) 20 MG tablet Take 2 tablets (40 mg total) by mouth daily. 03/10/16   Alvira Monday, MD  rosuvastatin (CRESTOR) 20 MG tablet Take 1 tablet (20 mg total) by mouth daily. 02/01/14 02/02/15  Gillian Scarce, MD   BP 137/101 mmHg  Pulse 68  Temp(Src) 98.3 F (36.8 C) (Oral)  Resp 18  Ht  (1.651 m)  Wt 215 lb (97.523 kg)  BMI 35.78 kg/m2  SpO2 97% Physical Exam  Constitutional: He is oriented to person, place, and time. He appears well-developed and well-nourished. No distress.  HENT:  Head: Normocephalic and atraumatic.  Eyes: Conjunctivae and EOM are normal.  Neck: Normal range of motion.  Cardiovascular: Normal rate, regular rhythm, normal heart sounds and intact distal pulses.  Exam reveals no gallop and no friction rub.   No murmur heard. Pulmonary/Chest: Effort normal and breath sounds normal. No respiratory distress. He has no wheezes. He has no rales.  Abdominal: Soft. He exhibits no distension. There is no tenderness.  There is no guarding.  Musculoskeletal: He exhibits no edema.  Neurological: He is alert and oriented to person, place, and time.  Skin: Skin is warm and dry. He is not diaphoretic.  Nursing note and vitals reviewed.   ED Course  Procedures (including critical care time) Labs Review Labs Reviewed - No data to display  Imaging Review No results found. I have personally reviewed and evaluated these images and lab results as part of my medical decision-making.   EKG Interpretation None      MDM   Final diagnoses:  Food impaction of esophagus, initial encounter   50 year old male with a history of hypertension and hyperlipidemia presents with concern for food impaction. Patient reports he was eating a piece of chicken about 1 hour prior to arrival, when he felt it get stuck, and initially had difficulty swallowing, was spitting, and unable to tolerate any fluids. On arrival to the emergency department he initially reported foreign body sensation lower his esophagus, and initially ordered glucagon and Zofran, however fibula spontaneously move distally, with relief of patient's symptoms prior to receiving glucagon. Patient able to tolerate fluids now in the emergency department. He's had one prior history of similar food impaction which resolved home. Given his history and presentation today, recommend gastroenterology follow-up and provided him with number. Given protonix rx. Patient discharged in stable condition with understanding of reasons to return.    Alvira Monday, MD 03/10/16 1744

## 2016-03-10 NOTE — ED Notes (Signed)
Patient states that he was eating chicken and has a piece stuck in his throat, trying to drink, but he throws up any fluids, no difficulty breathing

## 2016-03-10 NOTE — ED Notes (Signed)
MD notified that he thinks food has gone down all the way. Plan of care per MD: give pt Zofran and Coke, and hold Glucagon for now.

## 2021-03-25 ENCOUNTER — Encounter (HOSPITAL_BASED_OUTPATIENT_CLINIC_OR_DEPARTMENT_OTHER): Payer: Self-pay

## 2021-03-25 ENCOUNTER — Other Ambulatory Visit: Payer: Self-pay

## 2021-03-25 ENCOUNTER — Emergency Department (HOSPITAL_BASED_OUTPATIENT_CLINIC_OR_DEPARTMENT_OTHER): Payer: Self-pay

## 2021-03-25 ENCOUNTER — Inpatient Hospital Stay (HOSPITAL_BASED_OUTPATIENT_CLINIC_OR_DEPARTMENT_OTHER)
Admission: EM | Admit: 2021-03-25 | Discharge: 2021-03-29 | DRG: 286 | Disposition: A | Discharge status: Home / Self Care | Payer: Self-pay | Attending: Internal Medicine | Admitting: Internal Medicine

## 2021-03-25 DIAGNOSIS — Z6838 Body mass index (BMI) 38.0-38.9, adult: Secondary | ICD-10-CM

## 2021-03-25 DIAGNOSIS — Z9114 Patient's other noncompliance with medication regimen: Secondary | ICD-10-CM

## 2021-03-25 DIAGNOSIS — Z8249 Family history of ischemic heart disease and other diseases of the circulatory system: Secondary | ICD-10-CM

## 2021-03-25 DIAGNOSIS — N1831 Chronic kidney disease, stage 3a: Secondary | ICD-10-CM | POA: Diagnosis present

## 2021-03-25 DIAGNOSIS — I509 Heart failure, unspecified: Secondary | ICD-10-CM

## 2021-03-25 DIAGNOSIS — I5041 Acute combined systolic (congestive) and diastolic (congestive) heart failure: Secondary | ICD-10-CM

## 2021-03-25 DIAGNOSIS — Z20822 Contact with and (suspected) exposure to covid-19: Secondary | ICD-10-CM | POA: Diagnosis present

## 2021-03-25 DIAGNOSIS — I130 Hypertensive heart and chronic kidney disease with heart failure and stage 1 through stage 4 chronic kidney disease, or unspecified chronic kidney disease: Principal | ICD-10-CM | POA: Diagnosis present

## 2021-03-25 DIAGNOSIS — I428 Other cardiomyopathies: Secondary | ICD-10-CM

## 2021-03-25 DIAGNOSIS — I5021 Acute systolic (congestive) heart failure: Secondary | ICD-10-CM

## 2021-03-25 DIAGNOSIS — N183 Chronic kidney disease, stage 3 unspecified: Secondary | ICD-10-CM

## 2021-03-25 DIAGNOSIS — E785 Hyperlipidemia, unspecified: Secondary | ICD-10-CM

## 2021-03-25 DIAGNOSIS — Z7951 Long term (current) use of inhaled steroids: Secondary | ICD-10-CM

## 2021-03-25 DIAGNOSIS — R7303 Prediabetes: Secondary | ICD-10-CM

## 2021-03-25 DIAGNOSIS — I248 Other forms of acute ischemic heart disease: Secondary | ICD-10-CM | POA: Diagnosis present

## 2021-03-25 DIAGNOSIS — Z79899 Other long term (current) drug therapy: Secondary | ICD-10-CM

## 2021-03-25 DIAGNOSIS — I10 Essential (primary) hypertension: Secondary | ICD-10-CM | POA: Diagnosis present

## 2021-03-25 DIAGNOSIS — Z597 Insufficient social insurance and welfare support: Secondary | ICD-10-CM

## 2021-03-25 DIAGNOSIS — E669 Obesity, unspecified: Secondary | ICD-10-CM | POA: Diagnosis present

## 2021-03-25 DIAGNOSIS — J302 Other seasonal allergic rhinitis: Secondary | ICD-10-CM | POA: Diagnosis present

## 2021-03-25 DIAGNOSIS — E559 Vitamin D deficiency, unspecified: Secondary | ICD-10-CM | POA: Diagnosis present

## 2021-03-25 DIAGNOSIS — I2510 Atherosclerotic heart disease of native coronary artery without angina pectoris: Secondary | ICD-10-CM | POA: Diagnosis present

## 2021-03-25 LAB — CBC WITH DIFFERENTIAL/PLATELET
Abs Immature Granulocytes: 0.03 10*3/uL (ref 0.00–0.07)
Basophils Absolute: 0.1 10*3/uL (ref 0.0–0.1)
Basophils Relative: 1 %
Eosinophils Absolute: 0.3 10*3/uL (ref 0.0–0.5)
Eosinophils Relative: 4 %
HCT: 43.7 % (ref 39.0–52.0)
Hemoglobin: 14.2 g/dL (ref 13.0–17.0)
Immature Granulocytes: 0 %
Lymphocytes Relative: 44 %
Lymphs Abs: 3.7 10*3/uL (ref 0.7–4.0)
MCH: 27 pg (ref 26.0–34.0)
MCHC: 32.5 g/dL (ref 30.0–36.0)
MCV: 83.2 fL (ref 80.0–100.0)
Monocytes Absolute: 0.9 10*3/uL (ref 0.1–1.0)
Monocytes Relative: 11 %
Neutro Abs: 3.2 10*3/uL (ref 1.7–7.7)
Neutrophils Relative %: 40 %
Platelets: 335 10*3/uL (ref 150–400)
RBC: 5.25 MIL/uL (ref 4.22–5.81)
RDW: 15.7 % — ABNORMAL HIGH (ref 11.5–15.5)
WBC: 8.2 10*3/uL (ref 4.0–10.5)
nRBC: 0 % (ref 0.0–0.2)

## 2021-03-25 LAB — COMPREHENSIVE METABOLIC PANEL
ALT: 33 U/L (ref 0–44)
AST: 40 U/L (ref 15–41)
Albumin: 3 g/dL — ABNORMAL LOW (ref 3.5–5.0)
Alkaline Phosphatase: 59 U/L (ref 38–126)
Anion gap: 10 (ref 5–15)
BUN: 10 mg/dL (ref 6–20)
CO2: 22 mmol/L (ref 22–32)
Calcium: 8.5 mg/dL — ABNORMAL LOW (ref 8.9–10.3)
Chloride: 104 mmol/L (ref 98–111)
Creatinine, Ser: 1.29 mg/dL — ABNORMAL HIGH (ref 0.61–1.24)
GFR, Estimated: >60 mL/min (ref >60)
Glucose, Bld: 123 mg/dL — ABNORMAL HIGH (ref 70–99)
Potassium: 3.9 mmol/L (ref 3.5–5.1)
Sodium: 136 mmol/L (ref 135–145)
Total Bilirubin: 1.5 mg/dL — ABNORMAL HIGH (ref 0.3–1.2)
Total Protein: 6.4 g/dL — ABNORMAL LOW (ref 6.5–8.1)

## 2021-03-25 LAB — TROPONIN I (HIGH SENSITIVITY)
Troponin I (High Sensitivity): 42 ng/L — ABNORMAL HIGH (ref <18)
Troponin I (High Sensitivity): 15 ng/L (ref <18)

## 2021-03-25 LAB — HEMOGLOBIN A1C
Hgb A1c MFr Bld: 6.1 % — ABNORMAL HIGH (ref 4.8–5.6)
Mean Plasma Glucose: 128.37 mg/dL

## 2021-03-25 LAB — RESP PANEL BY RT-PCR (FLU A&B, COVID) ARPGX2
Influenza A by PCR: NEGATIVE
Influenza B by PCR: NEGATIVE
SARS Coronavirus 2 by RT PCR: NEGATIVE

## 2021-03-25 LAB — BRAIN NATRIURETIC PEPTIDE: B Natriuretic Peptide: 1966.6 pg/mL — ABNORMAL HIGH (ref 0.0–100.0)

## 2021-03-25 LAB — HIV ANTIBODY (ROUTINE TESTING W REFLEX): HIV Screen 4th Generation wRfx: NONREACTIVE

## 2021-03-25 MED ORDER — HYDRALAZINE HCL 25 MG PO TABS
25.0000 mg | ORAL_TABLET | Freq: Three times a day (TID) | ORAL | Status: DC
  Administered 2021-03-25 – 2021-03-27 (×6): 25 mg via ORAL
  Filled 2021-03-25 (×6): qty 1

## 2021-03-25 MED ORDER — PANTOPRAZOLE SODIUM 40 MG PO TBEC
40.0000 mg | DELAYED_RELEASE_TABLET | Freq: Every day | ORAL | Status: DC
  Administered 2021-03-25 – 2021-03-29 (×5): 40 mg via ORAL
  Filled 2021-03-25 (×5): qty 1

## 2021-03-25 MED ORDER — SPIRONOLACTONE 25 MG PO TABS
25.0000 mg | ORAL_TABLET | Freq: Every day | ORAL | Status: DC
  Administered 2021-03-25 – 2021-03-29 (×5): 25 mg via ORAL
  Filled 2021-03-25 (×5): qty 1

## 2021-03-25 MED ORDER — FUROSEMIDE 10 MG/ML IJ SOLN
40.0000 mg | Freq: Two times a day (BID) | INTRAMUSCULAR | Status: DC
  Administered 2021-03-25 – 2021-03-26 (×2): 40 mg via INTRAVENOUS
  Filled 2021-03-25 (×2): qty 4

## 2021-03-25 MED ORDER — LOSARTAN POTASSIUM 25 MG PO TABS
25.0000 mg | ORAL_TABLET | Freq: Every day | ORAL | Status: DC
  Administered 2021-03-25 – 2021-03-27 (×3): 25 mg via ORAL
  Filled 2021-03-25 (×3): qty 1

## 2021-03-25 MED ORDER — MONTELUKAST SODIUM 10 MG PO TABS
10.0000 mg | ORAL_TABLET | Freq: Every day | ORAL | Status: DC
  Administered 2021-03-26 – 2021-03-28 (×2): 10 mg via ORAL
  Filled 2021-03-25 (×3): qty 1

## 2021-03-25 MED ORDER — ENOXAPARIN SODIUM 60 MG/0.6ML IJ SOSY
50.0000 mg | PREFILLED_SYRINGE | INTRAMUSCULAR | Status: DC
  Administered 2021-03-25 – 2021-03-28 (×4): 50 mg via SUBCUTANEOUS
  Filled 2021-03-25 (×4): qty 0.6

## 2021-03-25 MED ORDER — ALBUTEROL SULFATE HFA 108 (90 BASE) MCG/ACT IN AERS
1.0000 | INHALATION_SPRAY | Freq: Four times a day (QID) | RESPIRATORY_TRACT | Status: DC | PRN
  Filled 2021-03-25: qty 6.7

## 2021-03-25 MED ORDER — POTASSIUM CHLORIDE CRYS ER 20 MEQ PO TBCR
20.0000 meq | EXTENDED_RELEASE_TABLET | Freq: Two times a day (BID) | ORAL | Status: DC
  Administered 2021-03-25 – 2021-03-26 (×2): 20 meq via ORAL
  Filled 2021-03-25 (×2): qty 1

## 2021-03-25 MED ORDER — FUROSEMIDE 10 MG/ML IJ SOLN
40.0000 mg | INTRAMUSCULAR | Status: AC
  Administered 2021-03-25: 40 mg via INTRAVENOUS
  Filled 2021-03-25: qty 4

## 2021-03-25 MED ORDER — ACETAMINOPHEN 325 MG PO TABS: 650.0000 mg | ORAL_TABLET | Freq: Four times a day (QID) | ORAL | Status: DC | PRN

## 2021-03-25 MED ORDER — ROSUVASTATIN CALCIUM 20 MG PO TABS
20.0000 mg | ORAL_TABLET | Freq: Every day | ORAL | Status: DC
  Administered 2021-03-25 – 2021-03-28 (×4): 20 mg via ORAL
  Filled 2021-03-25 (×4): qty 1

## 2021-03-25 NOTE — Progress Notes (Signed)
Pt provided with cup of water per request. RN aware.

## 2021-03-25 NOTE — Plan of Care (Signed)

## 2021-03-25 NOTE — H&P (Signed)
History and Physical    Baby Stairs CHE:[id number] DOB: [birth year] DOA: 03/25/2021  PCP: Gillian Scarce, MD  Patient coming from: Home via med Center [facility]  I have personally briefly reviewed patient's old medical records available.   Chief Complaint: Shortness of breath for a month  HPI: Andres Roman is a 55 y.o. male with medical history significant of hypertension, hyperlipidemia, vitamin D deficiency presenting to the emergency room with about 1 month of shortness of breath and gradually progressive symptoms.  According to the patient, he has been not taking his blood pressure medication for a while once he lost his PCP follow-up, started developing shortness of breath with exertion, went to urgent care a month ago and was treated with prednisone, albuterol as well as blood pressure medications were given.  Despite taking all these medications, he continues to have exertional dyspnea, he sleeps with head propped up, denies any PND, however has hard time falling asleep.  Patient denies any chest pain.  Denies any wheezing, no cough or sputum.  No fever. Since 1 month, he has progressive swelling of the legs.  Patient does not believe he gained any weight. ED Course: Hemodynamically stable.  2+ pedal edema.  Chest x-ray with bilateral vascular congestion.  Troponin mildly elevated.  BNP more than 1900.  Serum creatinine 1.29, last known 0.89 that was 7 years ago.  Given Lasix 40 mg IV in the ER and sent to hospital for admission for acute congestive heart failure.  Review of Systems: all systems are reviewed and pertinent positive as per HPI otherwise rest are negative.    Past Medical History:  Diagnosis Date  . Hyperlipidemia   . Hypertension   . Obese     Past Surgical History:  Procedure Laterality Date  . APPENDECTOMY      Social history   reports that he has never smoked. He has never used smokeless tobacco. He reports that he does not drink alcohol and does not use  drugs.  No Known Allergies  Family History  Problem Relation Age of Onset  . Diabetes Mother   . Hypertension Mother   . Stroke Mother   . Hypertension Brother   . Diabetes Brother   . Heart disease Paternal Grandmother      Prior to Admission medications   Medication Sig Start Date End Date Taking? Authorizing Provider  albuterol (VENTOLIN HFA) 108 (90 Base) MCG/ACT inhaler Inhale 1-2 puffs into the lungs every 6 (six) hours as needed for wheezing or shortness of breath.    [provider]  Azelastine-Fluticasone (DYMISTA) 137-50 MCG/ACT SUSP Place 1 spray into the nose 2 (two) times daily. 02/01/14 02/02/15  Gillian Scarce, MD  hydrochlorothiazide (HYDRODIURIL) 25 MG tablet Take 1 tablet (25 mg total) by mouth daily. 12/30/13 12/30/14  Gillian Scarce, MD  losartan (COZAAR) 25 MG tablet Take 25 mg by mouth daily.    [provider]  mometasone (NASONEX) 50 MCG/ACT nasal spray Place 2 sprays into the nose daily. 02/01/14 02/02/15  Zanard, Hinton Dyer, MD  montelukast (SINGULAIR) 10 MG tablet Take 1 tablet (10 mg total) by mouth at bedtime. 02/01/14 02/02/15  Gillian Scarce, MD  pantoprazole (PROTONIX) 20 MG tablet Take 2 tablets (40 mg total) by mouth daily. 03/10/16   Alvira Monday, MD  rosuvastatin (CRESTOR) 20 MG tablet Take 1 tablet (20 mg total) by mouth daily. 02/01/14 02/02/15  Gillian Scarce, MD    Physical Exam: Vitals:   03/25/21 1200  03/25/21 1245 03/25/21 1530 03/25/21 1623  BP: (!) 154/118 (!) 162/114 (!) 144/125 (!) 148/114  Pulse: (!) 103 (!) 101 (!) 104   Resp: (!) 23 (!) 24 (!) 22 20  Temp:    98.3 F (36.8 C)  TempSrc:    Oral  SpO2: 94% 91% 90% 98%  Weight:    107.7 kg  Height:    5\' 7"  (1.702 m)    Constitutional: NAD, calm, comfortable Vitals:   03/25/21 1200 03/25/21 1245 03/25/21 1530 03/25/21 1623  BP: (!) 154/118 (!) 162/114 (!) 144/125 (!) 148/114  Pulse: (!) 103 (!) 101 (!) 104   Resp: (!) 23 (!) 24 (!) 22 20  Temp:    98.3 F (36.8  C)  TempSrc:    Oral  SpO2: 94% 91% 90% 98%  Weight:    107.7 kg  Height:    5\' 7"  (1.702 m)   Eyes: PERRL, lids and conjunctivae normal Patient is fairly comfortable on room air.  He was wearing 2 L of oxygen on transport. ENMT: Mucous membranes are moist. Posterior pharynx clear of any exudate or lesions.Normal dentition.  Neck: normal, supple, no masses, no thyromegaly Respiratory: Positive bilateral clear.  No added sounds. Cardiovascular: Regular rate and rhythm, no murmurs / rubs / gallops.  2+ pedal edema.  2+ pedal pulses. No carotid bruits.  Abdomen: no tenderness, no masses palpated. No hepatosplenomegaly. Bowel sounds positive.  Musculoskeletal: no clubbing / cyanosis. No joint deformity upper and lower extremities. Good ROM, no contractures. Normal muscle tone.  Skin: no rashes, lesions, ulcers. No induration Neurologic: CN 2-12 grossly intact. Sensation intact, DTR normal. Strength 5/5 in all 4.  Psychiatric: Normal judgment and insight. Alert and oriented x 3. Normal mood.     Labs on Admission: I have personally reviewed following labs and imaging studies  CBC: Recent Labs  Lab 03/25/21 0809  WBC 8.2  NEUTROABS 3.2  HGB 14.2  HCT 43.7  MCV 83.2  PLT 335   Basic Metabolic Panel: Recent Labs  Lab 03/25/21 0809  NA 136  K 3.9  CL 104  CO2 22  GLUCOSE 123*  BUN 10  CREATININE 1.29*  CALCIUM 8.5*   GFR: Estimated Creatinine Clearance: 75.7 mL/min (A) (by C-G formula based on SCr of 1.29 mg/dL (H)). Liver Function Tests: Recent Labs  Lab 03/25/21 0809  AST 40  ALT 33  ALKPHOS 59  BILITOT 1.5*  PROT 6.4*  ALBUMIN 3.0*   No results for input(s): LIPASE, AMYLASE in the last 168 hours. No results for input(s): AMMONIA in the last 168 hours. Coagulation Profile: No results for input(s): INR, PROTIME in the last 168 hours. Cardiac Enzymes: No results for input(s): CKTOTAL, CKMB, CKMBINDEX, TROPONINI in the last 168 hours. BNP (last 3 results) No  results for input(s): PROBNP in the last 8760 hours. HbA1C: No results for input(s): HGBA1C in the last 72 hours. CBG: No results for input(s): GLUCAP in the last 168 hours. Lipid Profile: No results for input(s): CHOL, HDL, LDLCALC, TRIG, CHOLHDL, LDLDIRECT in the last 72 hours. Thyroid Function Tests: No results for input(s): TSH, T4TOTAL, FREET4, T3FREE, THYROIDAB in the last 72 hours. Anemia Panel: No results for input(s): VITAMINB12, FOLATE, FERRITIN, TIBC, IRON, RETICCTPCT in the last 72 hours. Urine analysis: No results found for: COLORURINE, APPEARANCEUR, LABSPEC, PHURINE, GLUCOSEU, HGBUR, BILIRUBINUR, KETONESUR, PROTEINUR, UROBILINOGEN, NITRITE, LEUKOCYTESUR  Radiological Exams on Admission: DG Chest 2 View  Result Date: 03/25/2021 CLINICAL DATA:  Progressive shortness of breath and  edema. EXAM: CHEST - 2 VIEW COMPARISON:  None FINDINGS: The heart is enlarged. There is mild interstitial pulmonary edema. No focal consolidations or pleural effusions. IMPRESSION: Cardiomegaly and mild interstitial edema. Electronically Signed   By: Norva Pavlov M.D.   On: 03/25/2021 08:46    EKG: Independently reviewed.  Normal sinus rhythm.  No acute ST-T wave changes.  Assessment/Plan Principal Problem:   CHF (congestive heart failure) (HCC) Active Problems:   Essential hypertension, benign     1.  Acute congestive heart failure, type unknown.  Suspect diastolic type. Agree with admission given severity of symptoms and needing for investigations. Lasix 40 mg IV twice daily, strict intake and output monitoring.  Daily weight. Echocardiogram Resume home dose of losartan that patient was inconsistently taking Discontinue amlodipine, will start patient on hydralazine. Beta-blockers on hold due to acute congestive heart failure. Hemoglobin A1c, lipid profile for further risk stratification. If severely reduced ejection fraction, will consult cardiology.  2.  Elevated troponins: Mild  elevated troponin, demand ischemia.  No evidence of acute coronary syndrome.  3.  Essential hypertension: Untreated.  Resume losartan, start on spironolactone and hydralazine.  4.  Hyperlipidemia: On Crestor 20 mg daily.  Will check lipid profile with fasting labs.  5.  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  Recent baseline unknown.  Will monitor on diuresis.  DVT prophylaxis: Lovenox subcu Code Status: Full code Family Communication: None at the bedside Disposition Plan: Home after stabilization Consults called: None Admission status: Cardiac telemetry   Dorcas Carrow MD [facility] Pager [phone]

## 2021-03-25 NOTE — ED Notes (Signed)
Report to Orlene Plum, Charity fundraiser at [facility] (Room 3E16C)

## 2021-03-25 NOTE — ED Triage Notes (Signed)
Pt presents with SOB x 1 month that has progressed to occurring at rest recently.  Pt also reports bilateral LE swelling x 2 weeks.  Denies CP or any leg pain.

## 2021-03-25 NOTE — ED Provider Notes (Signed)
[facility] EMERGENCY DEPARTMENT Provider Note   CSN: [id number] Arrival date & time: 03/25/21  0725     History Chief Complaint  Patient presents with  . Shortness of Breath    Andres Roman is a 55 y.o. male.  55 year old male with past medical history below including hypertension, hyperlipidemia who presents with shortness of breath.  Patient reports a 1 month history of persistent and progressively worsening shortness of breath that occurs with rest but is worse with exertion.  He endorses orthopnea and paroxysmal nocturnal dyspnea.  Over the past 2 weeks, he has noted worsening swelling of his legs.  He is not sure whether he has had weight gain.  He denies associated chest pain, cough, fevers, or recent illness. No personal hx of heart disease.  Denies any recent travel or history of blood clots.  The history is provided by the patient.  Shortness of Breath      Past Medical History:  Diagnosis Date  . Hyperlipidemia   . Hypertension   . Obese     Patient Active Problem List   Diagnosis Date Noted  . CHF (congestive heart failure) (HCC) 03/25/2021  . Microalbuminuria 04/09/2014  . Allergic rhinitis 04/09/2014  . Essential hypertension, benign 01/30/2014  . Other and unspecified hyperlipidemia 01/30/2014  . Other malaise and fatigue 01/30/2014  . Unspecified vitamin D deficiency 01/30/2014    Past Surgical History:  Procedure Laterality Date  . APPENDECTOMY         Family History  Problem Relation Age of Onset  . Diabetes Mother   . Hypertension Mother   . Stroke Mother   . Hypertension Brother   . Diabetes Brother   . Heart disease Paternal Grandmother     Social History   Tobacco Use  . Smoking status: Never Smoker  . Smokeless tobacco: Never Used  Vaping Use  . Vaping Use: Never used  Substance Use Topics  . Alcohol use: No  . Drug use: No    Home Medications Prior to Admission medications   Medication Sig Start Date End Date  Taking? Authorizing Provider  amLODipine (NORVASC) 10 MG tablet Take 1 tablet (10 mg total) by mouth daily. 12/30/13  Yes Zanard, Hinton Dyer, MD  omeprazole (PRILOSEC) 20 MG capsule Take 20 mg by mouth daily.   Yes [provider]  Azelastine-Fluticasone (DYMISTA) 137-50 MCG/ACT SUSP Place 1 spray into the nose 2 (two) times daily. 02/01/14 02/02/15  Gillian Scarce, MD  hydrochlorothiazide (HYDRODIURIL) 25 MG tablet Take 1 tablet (25 mg total) by mouth daily. 12/30/13 12/30/14  Zanard, Hinton Dyer, MD  mometasone (NASONEX) 50 MCG/ACT nasal spray Place 2 sprays into the nose daily. 02/01/14 02/02/15  Zanard, Hinton Dyer, MD  montelukast (SINGULAIR) 10 MG tablet Take 1 tablet (10 mg total) by mouth at bedtime. 02/01/14 02/02/15  Gillian Scarce, MD  pantoprazole (PROTONIX) 20 MG tablet Take 2 tablets (40 mg total) by mouth daily. 03/10/16   Alvira Monday, MD  rosuvastatin (CRESTOR) 20 MG tablet Take 1 tablet (20 mg total) by mouth daily. 02/01/14 02/02/15  Gillian Scarce, MD    Allergies    Patient has no known allergies.  Review of Systems   Review of Systems  Respiratory: Positive for shortness of breath.    All other systems reviewed and are negative except that which was mentioned in HPI  Physical Exam Updated Vital Signs BP (!) 154/118   Pulse (!) 103   Temp 98.1 F (36.7 C) (  Oral)   Resp (!) 23   Ht 5\' 7"  (1.702 m)   Wt 112.9 kg   SpO2 94%   BMI 38.98 kg/m   Physical Exam Vitals and nursing note reviewed.  Constitutional:      General: He is not in acute distress.    Appearance: Normal appearance. He is diaphoretic.     Comments: Mildly ill-appearing but no distress  HENT:     Head: Normocephalic and atraumatic.  Eyes:     Conjunctiva/sclera: Conjunctivae normal.  Cardiovascular:     Rate and Rhythm: Regular rhythm. Tachycardia present.     Heart sounds: Murmur heard.    Pulmonary:     Effort: Tachypnea present. No respiratory distress.     Comments: Diminished breath  sounds bilateral bases Abdominal:     General: Abdomen is flat. Bowel sounds are normal. There is no distension.     Palpations: Abdomen is soft.     Tenderness: There is no abdominal tenderness.  Musculoskeletal:     Right lower leg: Edema present.     Left lower leg: Edema present.  Skin:    General: Skin is warm.  Neurological:     Mental Status: He is alert and oriented to person, place, and time.     Comments: fluent  Psychiatric:        Mood and Affect: Mood normal.        Behavior: Behavior normal.     ED Results / Procedures / Treatments   Labs (all labs ordered are listed, but only abnormal results are displayed) Labs Reviewed  COMPREHENSIVE METABOLIC PANEL - Abnormal; Notable for the following components:      Result Value   Glucose, Bld 123 (*)    Creatinine, Ser 1.29 (*)    Calcium 8.5 (*)    Total Protein 6.4 (*)    Albumin 3.0 (*)    Total Bilirubin 1.5 (*)    All other components within normal limits  CBC WITH DIFFERENTIAL/PLATELET - Abnormal; Notable for the following components:   RDW 15.7 (*)    All other components within normal limits  BRAIN NATRIURETIC PEPTIDE - Abnormal; Notable for the following components:   B Natriuretic Peptide 1,966.6 (*)    All other components within normal limits  TROPONIN I (HIGH SENSITIVITY) - Abnormal; Notable for the following components:   Troponin I (High Sensitivity) 42 (*)    All other components within normal limits  RESP PANEL BY RT-PCR (FLU A&B, COVID) [id number]  TROPONIN I (HIGH SENSITIVITY)    EKG EKG Interpretation  Date/Time:  Sunday Mar 25 2021 08:06:29 EDT Ventricular Rate:  113 PR Interval:  161 QRS Duration: 90 QT Interval:  326 QTC Calculation: 447 R Axis:   92 Text Interpretation: Sinus tachycardia Probable left atrial enlargement Borderline right axis deviation Borderline T wave abnormalities non-specific T wave changes Confirmed by [id number] [phone]) on 03/25/2021 9:14:15 AM   Radiology DG  Chest 2 View  Result Date: 03/25/2021 CLINICAL DATA:  Progressive shortness of breath and edema. EXAM: CHEST - 2 VIEW COMPARISON:  None FINDINGS: The heart is enlarged. There is mild interstitial pulmonary edema. No focal consolidations or pleural effusions. IMPRESSION: Cardiomegaly and mild interstitial edema. Electronically Signed   By: 03/27/2021 M.D.   On: 03/25/2021 08:46    Procedures Procedures  CRITICAL CARE Performed by: 03/27/2021 Mckinsley Koelzer   Total critical care time: 30 minutes  Critical care time was exclusive of separately billable procedures and treating other  patients.  Critical care was necessary to treat or prevent imminent or life-threatening deterioration.  Critical care was time spent personally by me on the following activities: development of treatment plan with patient and/or surrogate as well as nursing, discussions with consultants, evaluation of patient's response to treatment, examination of patient, obtaining history from patient or surrogate, ordering and performing treatments and interventions, ordering and review of laboratory studies, ordering and review of radiographic studies, pulse oximetry and re-evaluation of patient's condition.  Medications Ordered in ED Medications  furosemide (LASIX) injection 40 mg (40 mg Intravenous Given 03/25/21 3009)    ED Course  I have reviewed the triage vital signs and the nursing notes.  Pertinent labs & imaging results that were available during my care of the patient were reviewed by me and considered in my medical decision making (see chart for details).    MDM Rules/Calculators/A&P                          Patient was alert, tachypneic, tachycardic,, hypertensive but maintaining saturations on room air.  He had pitting edema and heart murmur with diminished breath sounds.  I suspect possible volume overload.  Differential includes new congestive heart failure, PE, or pneumonia.  Chest x-ray shows  interstitial edema and cardiomegaly.  Lab work notable for creatinine 1.29, BNP 1966, troponin 42.  He denies any chest pain, EKG without acute ischemic changes, nonspecific T wave changes.  Suspect troponin is due to demand ischemia rather than NSTEMI.  Gave IV Lasix.  Discussed with cardiology, Dr. Bjorn Pippin, who recommended medicine admission.  Discussed admission with [facility] hospital[facility] at [facility], Dr. Katrinka Blazing. Final Clinical Impression(s) / ED Diagnoses Final diagnoses:  Acute congestive heart failure, unspecified heart failure type [facility])  Demand ischemia of myocardium [facility])    Rx / DC Orders ED Discharge Orders    None       Martice Doty, Ambrose Finland, MD 03/25/21 1230

## 2021-03-26 ENCOUNTER — Inpatient Hospital Stay (HOSPITAL_COMMUNITY): Payer: Self-pay

## 2021-03-26 DIAGNOSIS — I5021 Acute systolic (congestive) heart failure: Secondary | ICD-10-CM

## 2021-03-26 DIAGNOSIS — I509 Heart failure, unspecified: Secondary | ICD-10-CM

## 2021-03-26 DIAGNOSIS — I5030 Unspecified diastolic (congestive) heart failure: Secondary | ICD-10-CM

## 2021-03-26 HISTORY — DX: Acute systolic (congestive) heart failure: I50.21

## 2021-03-26 LAB — ECHOCARDIOGRAM COMPLETE
AR max vel: 2.61 cm2
AV Area VTI: 2.37 cm2
AV Area mean vel: 2.36 cm2
AV Mean grad: 4 mmHg
AV Peak grad: 6.3 mmHg
Ao pk vel: 1.25 m/s
Calc EF: 26.7 %
Height: 67 in
MV M vel: 5.36 m/s
MV Peak grad: 114.9 mmHg
Radius: 0.3 cm
S' Lateral: 5 cm
Single Plane A2C EF: 20.4 %
Single Plane A4C EF: 32.6 %
Weight: 3705.6 oz

## 2021-03-26 LAB — CBC
HCT: 45.9 % (ref 39.0–52.0)
Hemoglobin: 14.6 g/dL (ref 13.0–17.0)
MCH: 26.8 pg (ref 26.0–34.0)
MCHC: 31.8 g/dL (ref 30.0–36.0)
MCV: 84.4 fL (ref 80.0–100.0)
Platelets: 331 10*3/uL (ref 150–400)
RBC: 5.44 MIL/uL (ref 4.22–5.81)
RDW: 15.8 % — ABNORMAL HIGH (ref 11.5–15.5)
WBC: 8.5 10*3/uL (ref 4.0–10.5)
nRBC: 0 % (ref 0.0–0.2)

## 2021-03-26 LAB — LIPID PANEL
Cholesterol: 191 mg/dL (ref 0–200)
HDL: 32 mg/dL — ABNORMAL LOW (ref >40)
LDL Cholesterol: 140 mg/dL — ABNORMAL HIGH (ref 0–99)
Total CHOL/HDL Ratio: 6 RATIO
Triglycerides: 93 mg/dL (ref <150)
VLDL: 19 mg/dL (ref 0–40)

## 2021-03-26 LAB — BASIC METABOLIC PANEL
Anion gap: 11 (ref 5–15)
BUN: 11 mg/dL (ref 6–20)
CO2: 25 mmol/L (ref 22–32)
Calcium: 8.7 mg/dL — ABNORMAL LOW (ref 8.9–10.3)
Chloride: 104 mmol/L (ref 98–111)
Creatinine, Ser: 1.48 mg/dL — ABNORMAL HIGH (ref 0.61–1.24)
GFR, Estimated: 56 mL/min — ABNORMAL LOW (ref >60)
Glucose, Bld: 126 mg/dL — ABNORMAL HIGH (ref 70–99)
Potassium: 3.4 mmol/L — ABNORMAL LOW (ref 3.5–5.1)
Sodium: 140 mmol/L (ref 135–145)

## 2021-03-26 LAB — PHOSPHORUS: Phosphorus: 3.3 mg/dL (ref 2.5–4.6)

## 2021-03-26 LAB — MAGNESIUM: Magnesium: 1.9 mg/dL (ref 1.7–2.4)

## 2021-03-26 NOTE — Progress Notes (Signed)
[facility]  PROGRESS NOTE  Andres Roman TML:[id number] DOB: [birth year] DOA: 03/25/2021 PCP: Gillian Scarce, MD   Brief HPI:   55 year old male history of hypertension, hyperlipidemia, vitamin D deficiency presented to ED with about 1 month of shortness of breath and gradually progressive symptoms.  As per patient he has not been taking blood pressure medications.  While once he lost his PCP follow-up.  He started developing shortness of breath with exertion, went to urgent care a month ago and was treated with prednisone, albuterol and was given blood pressure medications.  Despite taking all his medications he continues to have dyspnea on exertion denies PND.    Subjective   Patient seen and examined, feels better with diuresis with Lasix.  Still has lower extremity edema.  Echocardiogram performed today.   Assessment/Plan:     1. Acute CHF-unknown whether systolic or diastolic-echocardiogram has been obtained and result is currently pending.  Patient has diuresed well with Lasix, net -6.7 L.  Will hold IV Lasix due to worsening renal function.  We will switch to p.o. Lasix from tomorrow morning. 2. Hypertension-untreated, poor compliance with medications.  Started on spironolactone, losartan and hydralazine.  Blood pressure is better controlled. 3. Hyperlipidemia-patient is on Crestor, LDL 140.  Not sure whether patient is compliant with his medication.  Continue Crestor 20 mg p.o. daily. 4. CKD stage IIIa-today creatinine is 1.48, likely from diuresis.  We will hold IV Lasix.  Follow BMP in am. 5. Hypokalemia-replace potassium and follow BMP in am.    Scheduled medications:   . enoxaparin (LOVENOX) injection  50 mg Subcutaneous Q24H  . furosemide  40 mg Intravenous BID  . hydrALAZINE  25 mg Oral Q8H  . losartan  25 mg Oral Daily  . montelukast  10 mg Oral QHS  . pantoprazole  40 mg Oral Daily  . potassium chloride  20 mEq Oral BID  . rosuvastatin  20 mg Oral Daily  .  spironolactone  25 mg Oral Daily         Data Reviewed:   CBG:  No results for input(s): GLUCAP in the last 168 hours.  SpO2: 98 %    Vitals:   03/26/21 0537 03/26/21 0729 03/26/21 1013 03/26/21 1527  BP: (!) 131/107 (!) 131/94 117/85 (!) 113/92  Pulse: (!) 101 (!) 107 98 (!) 102  Resp: 18 18 20 18   Temp: 97.9 F (36.6 C) 98.3 F (36.8 C) 97.6 F (36.4 C) 97.8 F (36.6 C)  TempSrc: Oral Oral Oral Oral  SpO2: 100% 100% 100% 98%  Weight:      Height:         Intake/Output Summary (Last 24 hours) at 03/26/2021 1611 Last data filed at 03/26/2021 1529 Gross per 24 hour  Intake 1320 ml  Output 4895 ml  Net -3575 ml    05/14 1901 - 05/16 0700 In: 480 [P.O.:480] Out: 6145 [Urine:6145]  Filed Weights   03/25/21 0745 03/25/21 1623 03/26/21 0500  Weight: 112.9 kg 107.7 kg 105.1 kg    CBC:  Recent Labs  Lab 03/25/21 0809 03/26/21 0919  WBC 8.2 8.5  HGB 14.2 14.6  HCT 43.7 45.9  PLT 335 331  MCV 83.2 84.4  MCH 27.0 26.8  MCHC 32.5 31.8  RDW 15.7* 15.8*  LYMPHSABS 3.7  --   MONOABS 0.9  --   EOSABS 0.3  --   BASOSABS 0.1  --     Complete metabolic panel:  Recent Labs  Lab 03/25/21 0809  03/25/21 1704 03/26/21 0919  NA 136  --  140  K 3.9  --  3.4*  CL 104  --  104  CO2 22  --  25  GLUCOSE 123*  --  126*  BUN 10  --  11  CREATININE 1.29*  --  1.48*  CALCIUM 8.5*  --  8.7*  AST 40  --   --   ALT 33  --   --   ALKPHOS 59  --   --   BILITOT 1.5*  --   --   ALBUMIN 3.0*  --   --   MG  --   --  1.9  HGBA1C  --  6.1*  --   BNP 1,966.6*  --   --     No results for input(s): LIPASE, AMYLASE in the last 168 hours.  Recent Labs  Lab 03/25/21 0809 03/25/21 0939  BNP 1,966.6*  --   SARSCOV2NAA  --  NEGATIVE    ------------------------------------------------------------------------------------------------------------------ Recent Labs    03/26/21 0919  CHOL 191  HDL 32*  LDLCALC 140*  TRIG 93  CHOLHDL 6.0    Lab Results  Component  Value Date   HGBA1C 6.1 (H) 03/25/2021   ------------------------------------------------------------------------------------------------------------------ No results for input(s): TSH, T4TOTAL, T3FREE, THYROIDAB in the last 72 hours.  Invalid input(s): FREET3 ------------------------------------------------------------------------------------------------------------------ No results for input(s): VITAMINB12, FOLATE, FERRITIN, TIBC, IRON, RETICCTPCT in the last 72 hours.  Coagulation profile No results for input(s): INR, PROTIME in the last 168 hours. No results for input(s): DDIMER in the last 72 hours.  Cardiac Enzymes No results for input(s): CKTOTAL, CKMB, CKMBINDEX, TROPONINI in the last 168 hours.  ------------------------------------------------------------------------------------------------------------------    Component Value Date/Time   BNP 1,966.6 (H) 03/25/2021 0809     Antibiotics: Anti-infectives (From admission, onward)   None       Radiology Reports  DG Chest 2 View  Result Date: 03/25/2021 CLINICAL DATA:  Progressive shortness of breath and edema. EXAM: CHEST - 2 VIEW COMPARISON:  None FINDINGS: The heart is enlarged. There is mild interstitial pulmonary edema. No focal consolidations or pleural effusions. IMPRESSION: Cardiomegaly and mild interstitial edema. Electronically Signed   By: Norva Pavlov M.D.   On: 03/25/2021 08:46      DVT prophylaxis: Lovenox  Code Status: Full code  Family Communication: No family at bedside   Consultants:    Procedures:  Echocardiogram    Objective    Physical Examination:   General-appears in no acute distress Heart-S1-S2, regular, no murmur auscultated Lungs-clear to auscultation bilaterally, no wheezing or crackles auscultated Abdomen-soft, nontender, no organomegaly Extremities-bilateral 2+ pitting edema in the lower extremities Neuro-alert, oriented x3, no focal deficit noted  Status is:  Inpatient  Dispo: The patient is from: Home              Anticipated d/c is to: Home              Anticipated d/c date is: 03/27/2021              Patient currently not stable for discharge  Barrier to discharge-CHF exacerbation  COVID-19 Labs  No results for input(s): DDIMER, FERRITIN, LDH, CRP in the last 72 hours.  Lab Results  Component Value Date   SARSCOV2NAA NEGATIVE 03/25/2021    Microbiology  Recent Results (from the past 240 hour(s))  Resp Panel by RT-PCR (Flu A&B, Covid) Nasopharyngeal Swab     Status: None   Collection Time: 03/25/21  9:39 AM  Specimen: Nasopharyngeal Swab; Nasopharyngeal(NP) swabs in vial transport medium  Result Value Ref Range Status   SARS Coronavirus 2 by RT PCR NEGATIVE NEGATIVE Final    Comment: (NOTE) SARS-CoV-2 target nucleic acids are NOT DETECTED.  The SARS-CoV-2 RNA is generally detectable in upper respiratory specimens during the acute phase of infection. The lowest concentration of SARS-CoV-2 viral copies this assay can detect is 138 copies/mL. A negative result does not preclude SARS-Cov-2 infection and should not be used as the sole basis for treatment or other patient management decisions. A negative result may occur with  improper specimen collection/handling, submission of specimen other than nasopharyngeal swab, presence of viral mutation(s) within the areas targeted by this assay, and inadequate number of viral copies(<138 copies/mL). A negative result must be combined with clinical observations, patient history, and epidemiological information. The expected result is Negative.  Fact Sheet for Patients:  BloggerCourse.com  Fact Sheet for Healthcare Providers:  SeriousBroker.it  This test is no t yet approved or cleared by the [place] FDA and  has been authorized for detection and/or diagnosis of SARS-CoV-2 by FDA under an Emergency Use Authorization (EUA). This  EUA will remain  in effect (meaning this test can be used) for the duration of the COVID-19 declaration under Section 564(b)(1) of the Act, 21 U.S.C.section 360bbb-3(b)(1), unless the authorization is terminated  or revoked sooner.       Influenza A by PCR NEGATIVE NEGATIVE Final   Influenza B by PCR NEGATIVE NEGATIVE Final    Comment: (NOTE) The Xpert Xpress SARS-CoV-2/FLU/RSV plus assay is intended as an aid in the diagnosis of influenza from Nasopharyngeal swab specimens and should not be used as a sole basis for treatment. Nasal washings and aspirates are unacceptable for Xpert Xpress SARS-CoV-2/FLU/RSV testing.  Fact Sheet for Patients: BloggerCourse.com  Fact Sheet for Healthcare Providers: SeriousBroker.it  This test is not yet approved or cleared by the [place] FDA and has been authorized for detection and/or diagnosis of SARS-CoV-2 by FDA under an Emergency Use Authorization (EUA). This EUA will remain in effect (meaning this test can be used) for the duration of the COVID-19 declaration under Section 564(b)(1) of the Act, 21 U.S.C. section 360bbb-3(b)(1), unless the authorization is terminated or revoked.  Performed at [facility], [address] [place], [place] [address]              Meredeth Ide   [facility] If 7PM-7AM, please contact night-coverage at [email or web address], Office  [phone]   03/26/2021, 4:11 PM  LOS: 1 day

## 2021-03-26 NOTE — Progress Notes (Signed)
*  PRELIMINARY RESULTS* Echocardiogram 2D Echocardiogram has been performed.  Neomia Dear RDCS 03/26/2021, 12:53 PM

## 2021-03-27 ENCOUNTER — Encounter (HOSPITAL_COMMUNITY): Payer: Self-pay | Admitting: Internal Medicine

## 2021-03-27 ENCOUNTER — Other Ambulatory Visit (HOSPITAL_COMMUNITY): Payer: Self-pay

## 2021-03-27 DIAGNOSIS — I248 Other forms of acute ischemic heart disease: Secondary | ICD-10-CM

## 2021-03-27 LAB — BASIC METABOLIC PANEL
Anion gap: 7 (ref 5–15)
BUN: 17 mg/dL (ref 6–20)
CO2: 26 mmol/L (ref 22–32)
Calcium: 8 mg/dL — ABNORMAL LOW (ref 8.9–10.3)
Chloride: 106 mmol/L (ref 98–111)
Creatinine, Ser: 1.46 mg/dL — ABNORMAL HIGH (ref 0.61–1.24)
GFR, Estimated: 56 mL/min — ABNORMAL LOW (ref >60)
Glucose, Bld: 105 mg/dL — ABNORMAL HIGH (ref 70–99)
Potassium: 3.7 mmol/L (ref 3.5–5.1)
Sodium: 139 mmol/L (ref 135–145)

## 2021-03-27 MED ORDER — FUROSEMIDE 10 MG/ML IJ SOLN
80.0000 mg | Freq: Two times a day (BID) | INTRAMUSCULAR | Status: DC
  Administered 2021-03-27 – 2021-03-28 (×2): 80 mg via INTRAVENOUS
  Filled 2021-03-27 (×2): qty 8

## 2021-03-27 MED ORDER — ISOSORBIDE MONONITRATE ER 30 MG PO TB24: 30.0000 mg | ORAL_TABLET | Freq: Every day | ORAL | Status: DC

## 2021-03-27 MED ORDER — SODIUM CHLORIDE 0.9% FLUSH: 3.0000 mL | INTRAVENOUS | Status: DC | PRN

## 2021-03-27 MED ORDER — SODIUM CHLORIDE 0.9% FLUSH
3.0000 mL | Freq: Two times a day (BID) | INTRAVENOUS | Status: DC
  Administered 2021-03-27 – 2021-03-28 (×2): 3 mL via INTRAVENOUS

## 2021-03-27 MED ORDER — SODIUM CHLORIDE 0.9 % IV SOLN: 250.0000 mL | INTRAVENOUS | Status: DC | PRN

## 2021-03-27 MED ORDER — SACUBITRIL-VALSARTAN 49-51 MG PO TABS
1.0000 | ORAL_TABLET | Freq: Two times a day (BID) | ORAL | Status: DC
  Administered 2021-03-28 – 2021-03-29 (×2): 1 via ORAL
  Filled 2021-03-27 (×2): qty 1

## 2021-03-27 MED ORDER — ASPIRIN 81 MG PO CHEW
81.0000 mg | CHEWABLE_TABLET | ORAL | Status: AC
  Administered 2021-03-28: 81 mg via ORAL
  Filled 2021-03-27: qty 1

## 2021-03-27 MED ORDER — HYDRALAZINE HCL 50 MG PO TABS: 50.0000 mg | ORAL_TABLET | Freq: Three times a day (TID) | ORAL | Status: DC

## 2021-03-27 MED ORDER — SODIUM CHLORIDE 0.9 % IV SOLN: INTRAVENOUS | Status: DC

## 2021-03-27 NOTE — Progress Notes (Addendum)
Heart Failure Nurse Navigator Progress Note  PCP: Gillian Scarce, MD PCP-Cardiologist: none listed Admission Diagnosis: CHF (NEW) Admitted from: home with spouse  Presentation:   Andres Roman presented with SOB, BLE edema, and orthopnea. Daughter at bedside, pt ok for screening with family at bedside and on speaker phone. Pt resting in bed on room air. Pt/family educated on HF basics, gave HF booklet. Pt states he takes his blood pressure medication as prescribed and an "anti-acid" medicine. Pt currently lives in an apartment, but had a severance package from his last full time job and the money is now gone--plans to move in with sister in [place] after discharge. Pt drives daughter's car to his part time job. Pt does not smoke or drink alcohol. Plan for HV TOC f/u upon discharge.  Cardiology consulted- Bjorn Loser went in after interview.   ECHO/ LVEF: 45%, LA severely dilated, RA mod dilated. Mod-severe MVR, mild AVR  Clinical Course:  Past Medical History:  Diagnosis Date  . Hyperlipidemia   . Hypertension   . Obese      Social History   Socioeconomic History  . Marital status: Single    Spouse name: Not on file  . Number of children: 4  . Years of education: 72  . Highest education level: High school graduate  Occupational History  . 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 Smoking status: Never Smoker  . Smokeless tobacco: Never Used  Vaping Use  . Vaping Use: Never used  Substance and Sexual Activity  . Alcohol use: No  . Drug use: No  . Sexual activity: Yes    Partners: Female    Birth control/protection: None  Other Topics Concern  . Not on file  Social History Narrative   Marital Status:  Separated Marketing executive)    Children:  Daughters (3) Step Son (1)    Pets: None    Living Situation: Lives alone   Occupation:  Naval architect (Occupational psychologist); Part-Time Health and safety inspector)   Education:  Environmental consultant    Tobacco  Use/Exposure:  None    Alcohol Use:  None    Drug Use:  None   Diet:  Regular   Exercise:  He does not do formal exercise but his jobs are very physical.     Hobbies:  Basketball                 Social Determinants of Health   Financial Resource Strain: High Risk  . Difficulty of Paying Living Expenses: Hard  Food Insecurity: No Food Insecurity  . Worried About Programme researcher, broadcasting/film/video in the Last Year: Never true  . Ran Out of Food in the Last Year: Never true  Transportation Needs: No Transportation Needs  . Lack of Transportation (Medical): No  . Lack of Transportation (Non-Medical): No  Physical Activity: Not on file  Stress: Not on file  Social Connections: Not on file   High Risk Criteria for Readmission and/or Poor Patient Outcomes:  Heart failure hospital admissions (last 6 months): 1   No Show rate: N/A  Difficult social situation: yes  Demonstrates medication adherence: yes, but questionable after continued conversation.  Primary Language: English  Literacy level: able to read/write and comprehend.  Barriers of Care:   -financial strain -new HF dx -no insurance  Considerations/Referrals:   Referral made to Heart Failure Pharmacist Stewardship: yes, appreciated Referral made to Heart & Vascular TOC clinic: yes,  5/24 @ 9AM.   Items for Follow-up on DC/TOC: -medication optimization -medication cost -no insurance -financial strain (works part time, no insurance through work, having to move in with sister in [place], drives daughter's car)-offered [employer] transportation services.   Ozella Rocks, RN, BSN Heart Failure Nurse Navigator [phone]

## 2021-03-27 NOTE — Consult Note (Addendum)
Cardiology Consultation:   Patient ID: Andres Roman MRN: [id number]; DOB: 04-17-1966  Admit date: 03/25/2021 Date of Consult: 03/27/2021  PCP:  Gillian Scarce, MD   [facility] Providers Cardiologist:  None    Patient Profile:   Andres Roman is a 55 y.o. male with a hx of uncontrolled HTN, HLD, poor med compliance, vit D def who is being seen 03/27/2021 for the evaluation of acute systolic CHF at the request of Dr Sharl Ma.  History of Present Illness:   Andres Roman was last seen by his PCP in 2019, per Care Everywhere. He went to UC a month ago for SOB, given prednisone, albuterol and BP meds.   Came to ER 05/15 for SOB >> EF 45%, in CHF, Cards asked to see.  Andres Roman had not had medical care since 2019.  He started noticing increasing shortness of breath.  He had some lower extremity edema as well.  About a month ago, he went to an Urgent Care in [facility].  He received steroids, another medication he cannot remember.  He was started on a blood pressure medication.  At that time, he had some pedal edema, but was told it would go away.  He was also having some lower abdominal symptoms, pain, but denies constipation or diarrhea.  He would also occasionally feel like food was getting stuck just above his stomach.  There was also a feeling that the food was just sitting in his stomach and not digesting.  He was given a medicine for his stomach, and those symptoms have improved.  His symptoms continued to worsen.  His lower extremity edema worsened and he developed orthopnea and PND.  He was not able to sleep well at all.  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  As time went on, he was doing less and less and his ability to walk into work was decreasing.  He also noticed increased abdominal girth.  At no time did he have any chest pain.  He went back to the Urgent Care because his symptoms were worsening, he did not think he could take it anymore.  He was  starting to feel short of breath at rest.  He was sent from there to the hospital and admitted.  Since being in the hospital, he still is short of breath with conversation but in general feels much better.  His breathing is better and his lower extremity edema is better as well.  He does not have a blood pressure cuff and has not been following his blood pressure.  He eats poorly.  He does not eat out a lot, but says he will get a piece of meat such as chicken and cook it and needed and that will be his meal.  His daughter is present and says that she is willing to help him get more nutritious and complete meals.  They are willing to talk to a nutritionist.  A lot of his issues with medications are financial, he only has a part-time job.  He does not currently have insurance.   Past Medical History:  Diagnosis Date  . Acute systolic CHF (congestive heart failure) (HCC) 03/26/2021  . Hyperlipidemia   . Hypertension   . Obese     Past Surgical History:  Procedure Laterality Date  . APPENDECTOMY       Home Medications:  Prior to Admission medications   Medication Sig Start Date End Date Taking? Authorizing Provider  acetaminophen (TYLENOL) 325  MG tablet Take 325-650 mg by mouth every 6 (six) hours as needed (for headaches).   Yes [provider]  albuterol (VENTOLIN HFA) 108 (90 Base) MCG/ACT inhaler Inhale 1-2 puffs into the lungs every 6 (six) hours as needed for wheezing or shortness of breath.   Yes [provider]  amLODipine (NORVASC) 5 MG tablet Take 5 mg by mouth daily.   Yes [provider]  aspirin EC 325 MG tablet Take 325 mg by mouth as needed (for headaches).   Yes [provider]  losartan (COZAAR) 25 MG tablet Take 25 mg by mouth daily.   Yes [provider]  Multiple Vitamins-Minerals (IMMUNE SYSTEM BOOSTER PO) Take 2 tablets by mouth daily.   Yes [provider]  omeprazole (PRILOSEC) 40 MG capsule Take 40 mg by  mouth at bedtime.   Yes [provider]  Azelastine-Fluticasone (DYMISTA) 137-50 MCG/ACT SUSP Place 1 spray into the nose 2 (two) times daily. Patient not taking: Reported on 03/25/2021 02/01/14 02/02/15  Gillian Scarce, MD  hydrochlorothiazide (HYDRODIURIL) 25 MG tablet Take 1 tablet (25 mg total) by mouth daily. Patient not taking: Reported on 03/25/2021 12/30/13 12/30/14  Gillian Scarce, MD  mometasone (NASONEX) 50 MCG/ACT nasal spray Place 2 sprays into the nose daily. Patient not taking: Reported on 03/25/2021 02/01/14 02/02/15  Gillian Scarce, MD  montelukast (SINGULAIR) 10 MG tablet Take 1 tablet (10 mg total) by mouth at bedtime. Patient not taking: Reported on 03/25/2021 02/01/14 02/02/15  Gillian Scarce, MD  pantoprazole (PROTONIX) 20 MG tablet Take 2 tablets (40 mg total) by mouth daily. Patient not taking: Reported on 03/25/2021 03/10/16   Alvira Monday, MD  rosuvastatin (CRESTOR) 20 MG tablet Take 1 tablet (20 mg total) by mouth daily. Patient not taking: Reported on 03/25/2021 02/01/14 02/02/15  Gillian Scarce, MD    Inpatient Medications: Scheduled Meds: . enoxaparin (LOVENOX) injection  50 mg Subcutaneous Q24H  . hydrALAZINE  25 mg Oral Q8H  . losartan  25 mg Oral Daily  . montelukast  10 mg Oral QHS  . pantoprazole  40 mg Oral Daily  . rosuvastatin  20 mg Oral Daily  . spironolactone  25 mg Oral Daily   Continuous Infusions:  PRN Meds: acetaminophen, albuterol  Allergies:   No Known Allergies  Social History:   Social History   Socioeconomic History  . Marital status: Single    Spouse name: Not on file  . Number of children: 4  . Years of education: 63  . Highest education level: High school graduate  Occupational History  . 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 Smoking status: Never Smoker  . Smokeless tobacco: Never Used  Vaping Use  . Vaping Use: Never used  Substance and Sexual Activity  .  Alcohol use: No  . Drug use: No  . Sexual activity: Yes    Partners: Female    Birth control/protection: None  Other Topics Concern  . Not on file  Social History Narrative   Marital Status:  Separated Marketing executive)    Children:  Daughters (3) Step Son (1)    Pets: None    Living Situation: Lives alone   Occupation:  Naval architect (Occupational psychologist); Part-Time Health and safety inspector)   Education:  Environmental consultant    Tobacco Use/Exposure:  None    Alcohol Use:  None    Drug Use:  None  Diet:  Regular   Exercise:  He does not do formal exercise but his jobs are very physical.     Hobbies:  Basketball                 Social Determinants of Health   Financial Resource Strain: High Risk  . Difficulty of Paying Living Expenses: Hard  Food Insecurity: No Food Insecurity  . Worried About Programme researcher, broadcasting/film/video in the Last Year: Never true  . Ran Out of Food in the Last Year: Never true  Transportation Needs: No Transportation Needs  . Lack of Transportation (Medical): No  . Lack of Transportation (Non-Medical): No  Physical Activity: Not on file  Stress: Not on file  Social Connections: Not on file  Intimate Partner Violence: Not on file    Family History:   Family History  Problem Relation Age of Onset  . Diabetes Mother   . Hypertension Mother   . Stroke Mother   . Hypertension Brother   . Diabetes Brother   . Heart disease Paternal Grandmother     Family Status  Relation Name Status  . Mother  Deceased  . Brother  Alive  . PGM  Deceased  . Father  Deceased at age 61s-30s       He was murdered.    [name] MGM  Deceased  . MGF  Deceased  . PGF  Deceased  . Sister  Alive  . Daughter  Alive  . Sister  Alive  . Daughter  Alive  . Daughter  Alive   ROS:  Please see the history of present illness.  All other ROS reviewed and negative.     Physical Exam/Data:   Vitals:   03/27/21 0500 03/27/21 0532 03/27/21 0909 03/27/21 1323  BP:  (!) 130/106  (!) 123/95  Pulse:  85  (!) 105   Resp:  19  18  Temp:  98.3 F (36.8 C) 97.8 F (36.6 C) 98.1 F (36.7 C)  TempSrc:  Oral Oral Oral  SpO2:  100%    Weight: 106 kg     Height:        Intake/Output Summary (Last 24 hours) at 03/27/2021 1521 Last data filed at 03/27/2021 1300 Gross per 24 hour  Intake 1194 ml  Output 800 ml  Net 394 ml   Last 3 Weights 03/27/2021 03/26/2021 03/25/2021  Weight (lbs) 233 lb 11 oz 231 lb 9.6 oz 237 lb 6.4 oz  Weight (kg) 106 kg 105.053 kg 107.684 kg     Body mass index is 36.6 kg/m.  General:  Well nourished, well developed, in mild respiratory distress HEENT: normal Lymph: no adenopathy Neck: 12 cm JVD Endocrine:  No thryomegaly Vascular: No carotid bruits; 4/4 extremity pulses 2+  Cardiac:  normal S1, S2; RRR; no murmur  Lungs: Rales bases bilaterally, no wheezing, rhonchi  Abd: soft, nontender, no hepatomegaly  Ext: no edema Musculoskeletal:  No deformities, BUE and BLE strength normal and equal Skin: warm and dry  Neuro:  CNs 2-12 intact, no focal abnormalities noted Psych:  Normal affect   EKG:  The EKG was personally reviewed and demonstrates:   05/15 ECG is ST, HR 113, ?LVH,  Telemetry:  Telemetry was personally reviewed and demonstrates:  SR, ST  Relevant CV Studies:  ECHO: 03/26/2021 1. Endocardium is difficult to visualize. There appears to be hypokiensis  of the inferoseptal (base/mid) and inferior (base/mid) walls. Would recomm  limited echo with Definity to confirm. . Left ventricular  ejection  fraction, by estimation, is 45%. The  left ventricle has mildly decreased function. The left ventricular  internal cavity size was moderately dilated. Left ventricular diastolic  parameters are indeterminate.  2. Right ventricular systolic function is normal. The right ventricular  size is normal. There is normal pulmonary artery systolic pressure.  3. Left atrial size was severely dilated.  4. Right atrial size was moderately dilated.  5. MR is eccentric,  directed posteriorly to back of left atrium . The  mitral valve is normal in structure. Moderate to severe mitral valve  regurgitation.  6. The aortic valve is tricuspid. Aortic valve regurgitation is mild.  Mild aortic valve sclerosis is present, with no evidence of aortic valve  stenosis.   Laboratory Data:  High Sensitivity Troponin:   Recent Labs  Lab 03/25/21 0809 03/25/21 1006  TROPONINIHS 42* 15     Chemistry Recent Labs  Lab 03/25/21 0809 03/26/21 0919 03/27/21 0351  NA 136 140 139  K 3.9 3.4* 3.7  CL 104 104 106  CO2 22 25 26   GLUCOSE 123* 126* 105*  BUN 10 11 17   CREATININE 1.29* 1.48* 1.46*  CALCIUM 8.5* 8.7* 8.0*  GFRNONAA >60 56* 56*  ANIONGAP 10 11 7     Recent Labs  Lab 03/25/21 0809  PROT 6.4*  ALBUMIN 3.0*  AST 40  ALT 33  ALKPHOS 59  BILITOT 1.5*   Hematology Recent Labs  Lab 03/25/21 0809 03/26/21 0919  WBC 8.2 8.5  RBC 5.25 5.44  HGB 14.2 14.6  HCT 43.7 45.9  MCV 83.2 84.4  MCH 27.0 26.8  MCHC 32.5 31.8  RDW 15.7* 15.8*  PLT 335 331   BNP Recent Labs  Lab 03/25/21 0809  BNP 1,966.6*    DDimer No results for input(s): DDIMER in the last 168 hours. Lab Results  Component Value Date   TSH 1.038 12/30/2013   Lab Results  Component Value Date   HGBA1C 6.1 (H) 03/25/2021   Lab Results  Component Value Date   CHOL 191 03/26/2021   HDL 32 (L) 03/26/2021   LDLCALC 140 (H) 03/26/2021   TRIG 93 03/26/2021   CHOLHDL 6.0 03/26/2021     Radiology/Studies:  DG Chest 2 View  Result Date: 03/25/2021 CLINICAL DATA:  Progressive shortness of breath and edema. EXAM: CHEST - 2 VIEW COMPARISON:  None FINDINGS: The heart is enlarged. There is mild interstitial pulmonary edema. No focal consolidations or pleural effusions. IMPRESSION: Cardiomegaly and mild interstitial edema. Electronically Signed   By: Norva Pavlov M.D.   On: 03/25/2021 08:46   ECHOCARDIOGRAM COMPLETE  Result Date: 03/26/2021    ECHOCARDIOGRAM REPORT   Patient  Name:   Camarion Gerwig Date of Exam: 03/26/2021 Medical Rec #:  [id number]     Height:       67.0 in Accession #:    [id number]    Weight:       231.6 lb Date of Birth:  1965-11-28     BSA:          2.152 m Patient Age:    55 years      BP:           131/94 mmHg Patient Gender: M             HR:           100 bpm. Exam Location:  Inpatient Procedure: 2D Echo, Cardiac Doppler and Color Doppler Indications:    CHF  History:  Patient has no prior history of Echocardiogram examinations.                 Risk Factors:Diabetes and Hypertension.  Sonographer:    Neomia Dear RDCS Referring Phys: [id number] KUBER GHIMIRE IMPRESSIONS  1. Endocardium is difficult to visualize. 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 Would recomm limited echo with Definity to confirm. . Left ventricular ejection fraction, by estimation, is 45%. The left ventricle has mildly decreased function. The left ventricular internal cavity size was moderately dilated. Left ventricular diastolic parameters are indeterminate.  2. Right ventricular systolic function is normal. The right ventricular size is normal. There is normal pulmonary artery systolic pressure.  3. Left atrial size was severely dilated.  4. Right atrial size was moderately dilated.  5. MR is eccentric, directed posteriorly to back of left atrium . The mitral valve is normal in structure. Moderate to severe mitral valve regurgitation.  6. The aortic valve is tricuspid. Aortic valve regurgitation is mild. Mild aortic valve sclerosis is present, with no evidence of aortic valve stenosis. FINDINGS  Left Ventricle: Endocardium is difficult to visualize. 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 Would recomm limited echo with Definity to confirm. Left ventricular ejection fraction, by estimation, is 45%. The left ventricle has mildly decreased function. The left ventricular internal  cavity size was moderately dilated. There is no left ventricular hypertrophy. Left ventricular diastolic parameters are indeterminate. Right Ventricle: The right ventricular size is normal. No increase in right ventricular wall thickness. Right ventricular systolic function is normal. There is normal pulmonary artery systolic pressure. The tricuspid regurgitant velocity is 2.84 m/s, and  with an assumed right atrial pressure of 3 mmHg, the estimated right ventricular systolic pressure is 35.3 mmHg. Left Atrium: Left atrial size was severely dilated. Right Atrium: Right atrial size was moderately dilated. Pericardium: There is no evidence of pericardial effusion. Mitral Valve: MR is eccentric, directed posteriorly to back of left atrium. The mitral valve is normal in structure. Moderate to severe mitral valve regurgitation. Tricuspid Valve: The tricuspid valve is normal in structure. Tricuspid valve regurgitation is mild. Aortic Valve: The aortic valve is tricuspid. Aortic valve regurgitation is mild. Mild aortic valve sclerosis is present, with no evidence of aortic valve stenosis. Aortic valve mean gradient measures 4.0 mmHg. Aortic valve peak gradient measures 6.2 mmHg. Aortic valve area, by VTI measures 2.37 cm. Pulmonic Valve: The pulmonic valve was normal in structure. Pulmonic valve regurgitation is mild. Aorta: The aortic root and ascending aorta are structurally normal, with no evidence of dilitation. IAS/Shunts: No atrial level shunt detected by color flow Doppler.  LEFT VENTRICLE PLAX 2D LVIDd:         5.90 cm      Diastology LVIDs:         5.00 cm      LV e' medial:    8.05 cm/s LV PW:         1.10 cm      LV E/e' medial:  11.4 LV IVS:        0.90 cm      LV e' lateral:   10.40 cm/s LVOT diam:     2.25 cm      LV E/e' lateral: 8.9 LV SV:         47 LV SV Index:   22 LVOT Area:     3.98 cm  LV Volumes (MOD) LV vol d,  MOD A2C: 157.0 ml LV vol d, MOD A4C: 138.0 ml LV vol s, MOD A2C: 125.0 ml LV vol s, MOD  A4C: 93.0 ml LV SV MOD A2C:     32.0 ml LV SV MOD A4C:     138.0 ml LV SV MOD BP:      39.3 ml RIGHT VENTRICLE RV S prime:     14.80 cm/s TAPSE (M-mode): 1.8 cm LEFT ATRIUM              Index       RIGHT ATRIUM           Index LA diam:        5.20 cm  2.42 cm/m  RA Area:     26.10 cm LA Vol (A2C):   95.1 ml  44.18 ml/m RA Volume:   97.30 ml  45.21 ml/m LA Vol (A4C):   149.0 ml 69.23 ml/m LA Biplane Vol: 121.0 ml 56.22 ml/m  AORTIC VALVE AV Area (Vmax):    2.61 cm AV Area (Vmean):   2.36 cm AV Area (VTI):     2.37 cm AV Vmax:           125.00 cm/s AV Vmean:          91.200 cm/s AV VTI:            0.198 m AV Peak Grad:      6.2 mmHg AV Mean Grad:      4.0 mmHg LVOT Vmax:         82.20 cm/s LVOT Vmean:        54.200 cm/s LVOT VTI:          0.118 m LVOT/AV VTI ratio: 0.60  AORTA Ao Root diam: 3.30 cm Ao Asc diam:  3.00 cm MR Peak grad:    114.9 mmHg  TRICUSPID VALVE MR Mean grad:    74.0 mmHg   TR Peak grad:   32.3 mmHg MR Vmax:         536.00 cm/s TR Vmax:        284.00 cm/s MR Vmean:        399.0 cm/s MR PISA:         0.57 cm    SHUNTS MR PISA Eff ROA: 3 mm       Systemic VTI:  0.12 m MR PISA Radius:  0.30 cm     Systemic Diam: 2.25 cm MV E velocity: 92.10 cm/s Dietrich Pates MD Electronically signed by Dietrich Pates MD Signature Date/Time: 03/26/2021/7:18:22 PM    Final      Assessment and Plan:   1. Acute systolic CHF - echo results above, limited echo w/ Definity ordered - got 3 doses of IV Lasix 40 mg since admit -Still has significant volume overload by exam, would continue the IV Lasix for now - Suspect he also has significant diastolic dysfunction due to prolonged untreated hypertension despite diastolic parameters being indeterminate on the echo -With regional wall motion abnormalities on echo, ischemic evaluation is needed, but will defer this until his volume status is improved -Mild elevation in troponin is more consistent with CHF than ACS -With a creatinine of 1.48, on medications that do  not affect his creatinine. - Increase hydralazine to 50 mg every 8 hours - Start Imdur 30 mg daily - With the need for ongoing diuresis, discuss with MD if he should be on losartan and spironolactone now or wait and see what his creatinine is like after diuresis -Continue Reds  vest readings as available - Nutrition consult  2. HTN - BP 156/127 on admit -Unclear what medications he was actually taking prior to admission, we do not have records [facility], have called them, they will send - improved w/ meds but diastolic still very high, 90-110 - currently on home rx of losartan 25 mg qd - also on hydralazine 25 mg tid, aldactone 25 mg qd >> new - at home was also on amlodipine 5 mg qd pta, on hold -Increase hydralazine and add Imdur  3. AKI vs CKD -Creatinine was 1.07 in 2019, no labs between then and now - On admission then up to 1.48 and now 1.46 - Continue to follow with diuresis  Otherwise, per IM Principal Problem:   CHF (congestive heart failure) (HCC) Active Problems:   Essential hypertension, benign    Risk Assessment/Risk Scores:     New York Heart Association (NYHA) Functional Class NYHA Class IV   For questions or updates, please contact [facility] Please consult [email or web address] for contact info under    Signed, Theodore DemarkRhonda Jamarian Jacinto, PA-C  03/27/2021 3:21 PM

## 2021-03-27 NOTE — Progress Notes (Signed)
Heart Failure Stewardship Pharmacist Progress Note   PCP: Gillian Scarce, MD PCP-Cardiologist: None    HPI:  55 yo M with PMH of HTN, HLD, and vitamin D deficiency. He presented to the ED on 03/25/21 with shortness of breath, orthopnea, and BLE edema. A CXR was done on 03/25/21 and showed mild interstitial edema. An ECHO was done on 03/26/21 and LVEF is reduced to 45% with normal RV systolic function.   Current HF Medications: Losartan 25 mg daily Spironolactone 25 mg daily Hydralazine 25 mg q8h  Prior to admission HF Medications: Losartan 25 mg daily  Pertinent Lab Values: . Serum creatinine 1.46, BUN 17, Potassium 3.7, Sodium 139, BNP 1966.6, Magnesium 1.9   Vital Signs: . Weight: 233 lbs (admission weight: 237 lbs) . Blood pressure: 130/100s  . Heart rate: 100s   Medication Assistance / Insurance Benefits Check: Does the patient have prescription insurance?  No  Does the patient qualify for medication assistance through manufacturers or grants?   Pending . Eligible grants and/or patient assistance programs: pending . Medication assistance applications in progress: none  . Medication assistance applications approved: none Approved medication assistance renewals will be completed by: pending  Outpatient Pharmacy:  Prior to admission [facility]: [facility] Is the patient willing to use [facility] TOC pharmacy at discharge? Yes Is the patient willing to transition their outpatient pharmacy to utilize a [facility] outpatient pharmacy?   Pending    Assessment: 1. Acute systolic CHF (EF 74%), due to unknown etiology. No ischemic evaluation completed yet. NYHA class II symptoms. - Off IV lasix pending cardiology consult - Continue to hold off beta blocker until he is euvolemic. Start metoprolol XL or carvedilol prior to discharge.  - Consider transitioning losartan to Entresto 24/26 mg BID - Continue spironolactone 25 mg daily  - Consider starting Farxiga 10 mg daily  prior to discharge - Continue hydralazine 25 mg q8h. Consider starting Imdur 30 mg daily for hydralazine/nitrate benefit.    Plan: 1) Medication changes recommended at this time: - Stop losartan and start Entresto 24/26 mg BID - Add Imdur 30 mg daily  2) Patient assistance: - Appears patient is uninsured. Can help him enroll in patient assistance programs for Entresto/Farxiga.  - Will consult HF CSW to help initiate long term insurance plan for him  3)  Education  - To be completed prior to discharge  Sharen Hones, PharmD, BCPS Heart Failure Stewardship Pharmacist Phone [phone]

## 2021-03-27 NOTE — Progress Notes (Signed)
[facility]  PROGRESS NOTE  Andres Roman ZOX:[id number] DOB: 05-09-1966 DOA: 03/25/2021 PCP: Andres ScarceZanard, Robyn K, MD   Brief HPI:   55 year old male history of hypertension, hyperlipidemia, vitamin D deficiency presented to ED with about 1 month of shortness of breath and gradually progressive symptoms.  As per patient he has not been taking blood pressure medications.  While once he lost his PCP follow-up.  He started developing shortness of breath with exertion, went to urgent care a month ago and was treated with prednisone, albuterol and was given blood pressure medications.  Despite taking all his medications he continues to have dyspnea on exertion denies PND.  Echocardiogram obtained yesterday showed EF 45%, hypokinesis of the inferoseptal and inferior walls.  Left ventricular function is mildly decreased, left ventricle dilated.  Also has biatrial dilation, MR.   Subjective   Patient seen and examined, denies shortness of breath.  Echocardiogram obtained yesterday showed EF 45%, hypokinesis of the inferoseptal and inferior walls.  Left ventricular function is mildly decreased, left ventricle dilated.  Also has biatrial dilation, MR.   Assessment/Plan:     1. Acute systolic CHF-echocardiogram obtained yesterday showed EF 45%, hypokinesis of the inferoseptal and inferior walls.  Left ventricular function was mildly decreased with dilation of left ventricle.  Also has biatrial dilation and MR.  Will consult cardiology for further recommendations, likely hypertensive heart disease.  Might need cardiac catheterization for evaluation of coronary arteries.   2. Hypertension-untreated, poor compliance with medications.  Started on spironolactone, losartan and hydralazine.  Blood pressure is better controlled. 3. Hyperlipidemia-patient is on Crestor, LDL 140.  Not sure whether patient is compliant with his medication.  Continue Crestor 20 mg p.o. daily. 4. CKD stage IIIa-today creatinine is  1.46, from diuresis.  IV Lasix is on hold.  Follow BMP in am.  5. Hypokalemia-replete    Scheduled medications:   . enoxaparin (LOVENOX) injection  50 mg Subcutaneous Q24H  . hydrALAZINE  25 mg Oral Q8H  . losartan  25 mg Oral Daily  . montelukast  10 mg Oral QHS  . pantoprazole  40 mg Oral Daily  . rosuvastatin  20 mg Oral Daily  . spironolactone  25 mg Oral Daily         Data Reviewed:   CBG:  No results for input(s): GLUCAP in the last 168 hours.  SpO2: 100 %    Vitals:   03/26/21 2059 03/27/21 0500 03/27/21 0532 03/27/21 0909  BP: (!) 127/94  (!) 130/106   Pulse: (!) 103  85   Resp: 18  19   Temp: 98.1 F (36.7 C)  98.3 F (36.8 C) 97.8 F (36.6 C)  TempSrc: Oral  Oral Oral  SpO2: 100%  100%   Weight:  106 kg    Height:         Intake/Output Summary (Last 24 hours) at 03/27/2021 1208 Last data filed at 03/27/2021 0900 Gross per 24 hour  Intake 1197 ml  Output 1450 ml  Net -253 ml    05/15 1901 - 05/17 0700 In: 1557 [P.O.:1557] Out: 5275 [Urine:5275]  Filed Weights   03/25/21 1623 03/26/21 0500 03/27/21 0500  Weight: 107.7 kg 105.1 kg 106 kg    CBC:  Recent Labs  Lab 03/25/21 0809 03/26/21 0919  WBC 8.2 8.5  HGB 14.2 14.6  HCT 43.7 45.9  PLT 335 331  MCV 83.2 84.4  MCH 27.0 26.8  MCHC 32.5 31.8  RDW 15.7* 15.8*  LYMPHSABS 3.7  --  MONOABS 0.9  --   EOSABS 0.3  --   BASOSABS 0.1  --     Complete metabolic panel:  Recent Labs  Lab 03/25/21 0809 03/25/21 1704 03/26/21 0919 03/27/21 0351  NA 136  --  140 139  Roman 3.9  --  3.4* 3.7  CL 104  --  104 106  CO2 22  --  25 26  GLUCOSE 123*  --  126* 105*  BUN 10  --  11 17  CREATININE 1.29*  --  1.48* 1.46*  CALCIUM 8.5*  --  8.7* 8.0*  AST 40  --   --   --   ALT 33  --   --   --   ALKPHOS 59  --   --   --   BILITOT 1.5*  --   --   --   ALBUMIN 3.0*  --   --   --   MG  --   --  1.9  --   HGBA1C  --  6.1*  --   --   BNP 1,966.6*  --   --   --     No results for input(s):  LIPASE, AMYLASE in the last 168 hours.  Recent Labs  Lab 03/25/21 0809 03/25/21 0939  BNP 1,966.6*  --   SARSCOV2NAA  --  NEGATIVE    ------------------------------------------------------------------------------------------------------------------ Recent Labs    03/26/21 0919  CHOL 191  HDL 32*  LDLCALC 140*  TRIG 93  CHOLHDL 6.0    Lab Results  Component Value Date   HGBA1C 6.1 (H) 03/25/2021   ------------------------------------------------------------------------------------------------------------------ No results for input(s): TSH, T4TOTAL, T3FREE, THYROIDAB in the last 72 hours.  Invalid input(s): FREET3 ------------------------------------------------------------------------------------------------------------------ No results for input(s): VITAMINB12, FOLATE, FERRITIN, TIBC, IRON, RETICCTPCT in the last 72 hours.  Coagulation profile No results for input(s): INR, PROTIME in the last 168 hours. No results for input(s): DDIMER in the last 72 hours.  Cardiac Enzymes No results for input(s): CKTOTAL, CKMB, CKMBINDEX, TROPONINI in the last 168 hours.  ------------------------------------------------------------------------------------------------------------------    Component Value Date/Time   BNP 1,966.6 (H) 03/25/2021 0809     Antibiotics: Anti-infectives (From admission, onward)   None       Radiology Reports  ECHOCARDIOGRAM COMPLETE  Result Date: 03/26/2021    ECHOCARDIOGRAM REPORT   Patient Name:   Andres Roman Date of Exam: 03/26/2021 Medical Rec #:  [id number]     Height:       67.0 in Accession #:    [id number]    Weight:       231.6 lb Date of Birth:  1965-12-24     BSA:          2.152 m Patient Age:    55 years      BP:           131/94 mmHg Patient Gender: M             HR:           100 bpm. Exam Location:  Inpatient Procedure: 2D Echo, Cardiac Doppler and Color Doppler Indications:    CHF  History:        Patient has no prior history of  Echocardiogram examinations.                 Risk Factors:Diabetes and Hypertension.  Sonographer:    Andres Roman RDCS Referring Phys: [id number] Andres Roman  1. Endocardium is difficult to visualize. There appears to be hypokiensis of  the inferoseptal (base/mid) and inferior (base/mid) walls. Would recomm limited echo with Definity to confirm. . Left ventricular ejection fraction, by estimation, is 45%. The left ventricle has mildly decreased function. The left ventricular internal cavity size was moderately dilated. Left ventricular diastolic parameters are indeterminate.  2. Right ventricular systolic function is normal. The right ventricular size is normal. There is normal pulmonary artery systolic pressure.  3. Left atrial size was severely dilated.  4. Right atrial size was moderately dilated.  5. MR is eccentric, directed posteriorly to back of left atrium . The mitral valve is normal in structure. Moderate to severe mitral valve regurgitation.  6. The aortic valve is tricuspid. Aortic valve regurgitation is mild. Mild aortic valve sclerosis is present, with no evidence of aortic valve stenosis. FINDINGS  Left Ventricle: Endocardium is difficult to visualize. 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 Would recomm limited echo with Definity to confirm. Left ventricular ejection fraction, by estimation, is 45%. The left ventricle has mildly decreased function. The left ventricular internal cavity size was moderately dilated. There is no left ventricular hypertrophy. Left ventricular diastolic parameters are indeterminate. Right Ventricle: The right ventricular size is normal. No increase in right ventricular wall thickness. Right ventricular systolic function is normal. There is normal pulmonary artery systolic pressure. The tricuspid regurgitant velocity is 2.84 m/s, and  with an assumed right atrial pressure of 3 mmHg, the estimated right  ventricular systolic pressure is 35.3 mmHg. Left Atrium: Left atrial size was severely dilated. Right Atrium: Right atrial size was moderately dilated. Pericardium: There is no evidence of pericardial effusion. Mitral Valve: MR is eccentric, directed posteriorly to back of left atrium. The mitral valve is normal in structure. Moderate to severe mitral valve regurgitation. Tricuspid Valve: The tricuspid valve is normal in structure. Tricuspid valve regurgitation is mild. Aortic Valve: The aortic valve is tricuspid. Aortic valve regurgitation is mild. Mild aortic valve sclerosis is present, with no evidence of aortic valve stenosis. Aortic valve mean gradient measures 4.0 mmHg. Aortic valve peak gradient measures 6.2 mmHg. Aortic valve area, by VTI measures 2.37 cm. Pulmonic Valve: The pulmonic valve was normal in structure. Pulmonic valve regurgitation is mild. Aorta: The aortic root and ascending aorta are structurally normal, with no evidence of dilitation. IAS/Shunts: No atrial level shunt detected by color flow Doppler.  LEFT VENTRICLE PLAX 2D LVIDd:         5.90 cm      Diastology LVIDs:         5.00 cm      LV e' medial:    8.05 cm/s LV PW:         1.10 cm      LV E/e' medial:  11.4 LV IVS:        0.90 cm      LV e' lateral:   10.40 cm/s LVOT diam:     2.25 cm      LV E/e' lateral: 8.9 LV SV:         47 LV SV Index:   22 LVOT Area:     3.98 cm  LV Volumes (MOD) LV vol d, MOD A2C: 157.0 ml LV vol d, MOD A4C: 138.0 ml LV vol s, MOD A2C: 125.0 ml LV vol s, MOD A4C: 93.0 ml LV SV MOD A2C:     32.0 ml LV SV MOD A4C:     138.0 ml LV SV MOD BP:      39.3  ml RIGHT VENTRICLE RV S prime:     14.80 cm/s TAPSE (M-mode): 1.8 cm LEFT ATRIUM              Index       RIGHT ATRIUM           Index LA diam:        5.20 cm  2.42 cm/m  RA Area:     26.10 cm LA Vol (A2C):   95.1 ml  44.18 ml/m RA Volume:   97.30 ml  45.21 ml/m LA Vol (A4C):   149.0 ml 69.23 ml/m LA Biplane Vol: 121.0 ml 56.22 ml/m  AORTIC VALVE AV Area  (Vmax):    2.61 cm AV Area (Vmean):   2.36 cm AV Area (VTI):     2.37 cm AV Vmax:           125.00 cm/s AV Vmean:          91.200 cm/s AV VTI:            0.198 m AV Peak Grad:      6.2 mmHg AV Mean Grad:      4.0 mmHg LVOT Vmax:         82.20 cm/s LVOT Vmean:        54.200 cm/s LVOT VTI:          0.118 m LVOT/AV VTI ratio: 0.60  AORTA Ao Root diam: 3.30 cm Ao Asc diam:  3.00 cm MR Peak grad:    114.9 mmHg  TRICUSPID VALVE MR Mean grad:    74.0 mmHg   TR Peak grad:   32.3 mmHg MR Vmax:         536.00 cm/s TR Vmax:        284.00 cm/s MR Vmean:        399.0 cm/s MR PISA:         0.57 cm    SHUNTS MR PISA Eff ROA: 3 mm       Systemic VTI:  0.12 m MR PISA Radius:  0.30 cm     Systemic Diam: 2.25 cm MV E velocity: 92.10 cm/s Dietrich Pates MD Electronically signed by Dietrich Pates MD Signature Date/Time: 03/26/2021/7:18:22 PM    Final       DVT prophylaxis: Lovenox  Code Status: Full code  Family Communication: No family at bedside   Consultants:    Procedures:  Echocardiogram    Objective    Physical Examination:   General-appears in no acute distress Heart-S1-S2, regular, no murmur auscultated Lungs-clear to auscultation bilaterally, no wheezing or crackles auscultated Abdomen-soft, nontender, no organomegaly Extremities-bilateral 1+  edema in the lower extremities Neuro-alert, oriented x3, no focal deficit noted  Status is: Inpatient  Dispo: The patient is from: Home              Anticipated d/c is to: Home              Anticipated d/c date is: 03/28/2021              Patient currently not stable for discharge  Barrier to discharge-CHF exacerbation, pending evaluation by cardiology for CHF  COVID-19 Labs  No results for input(s): DDIMER, FERRITIN, LDH, CRP in the last 72 hours.  Lab Results  Component Value Date   SARSCOV2NAA NEGATIVE 03/25/2021    Microbiology  Recent Results (from the past 240 hour(s))  Resp Panel by RT-PCR (Flu A&B, Covid) Nasopharyngeal Swab      Status: None   Collection  Time: 03/25/21  9:39 AM   Specimen: Nasopharyngeal Swab; Nasopharyngeal(NP) swabs in vial transport medium  Result Value Ref Range Status   SARS Coronavirus 2 by RT PCR NEGATIVE NEGATIVE Final    Comment: (NOTE) SARS-CoV-2 target nucleic acids are NOT DETECTED.  The SARS-CoV-2 RNA is generally detectable in upper respiratory specimens during the acute phase of infection. The lowest concentration of SARS-CoV-2 viral copies this assay can detect is 138 copies/mL. A negative result does not preclude SARS-Cov-2 infection and should not be used as the sole basis for treatment or other patient management decisions. A negative result may occur with  improper specimen collection/handling, submission of specimen other than nasopharyngeal swab, presence of viral mutation(s) within the areas targeted by this assay, and inadequate number of viral copies(<138 copies/mL). A negative result must be combined with clinical observations, patient history, and epidemiological information. The expected result is Negative.  Fact Sheet for Patients:  BloggerCourse.com  Fact Sheet for Healthcare Providers:  SeriousBroker.it  This test is no t yet approved or cleared by the [place] FDA and  has been authorized for detection and/or diagnosis of SARS-CoV-2 by FDA under an Emergency Use Authorization (EUA). This EUA will remain  in effect (meaning this test can be used) for the duration of the COVID-19 declaration under Section 564(b)(1) of the Act, 21 U.S.C.section 360bbb-3(b)(1), unless the authorization is terminated  or revoked sooner.       Influenza A by PCR NEGATIVE NEGATIVE Final   Influenza B by PCR NEGATIVE NEGATIVE Final    Comment: (NOTE) The Xpert Xpress SARS-CoV-2/FLU/RSV plus assay is intended as an aid in the diagnosis of influenza from Nasopharyngeal swab specimens and should not be used as a sole basis  for treatment. Nasal washings and aspirates are unacceptable for Xpert Xpress SARS-CoV-2/FLU/RSV testing.  Fact Sheet for Patients: BloggerCourse.com  Fact Sheet for Healthcare Providers: SeriousBroker.it  This test is not yet approved or cleared by the [place] FDA and has been authorized for detection and/or diagnosis of SARS-CoV-2 by FDA under an Emergency Use Authorization (EUA). This EUA will remain in effect (meaning this test can be used) for the duration of the COVID-19 declaration under Section 564(b)(1) of the Act, 21 U.S.C. section 360bbb-3(b)(1), unless the authorization is terminated or revoked.  Performed at [facility], [address] [place], [place] [address]          Meredeth Ide   [facility] If 7PM-7AM, please contact night-coverage at [email or web address], Office  [phone]   03/27/2021, 12:08 PM  LOS: 2 days

## 2021-03-28 ENCOUNTER — Encounter (HOSPITAL_COMMUNITY)
Admission: EM | Disposition: A | Discharge status: Home / Self Care | Payer: Self-pay | Source: Home / Self Care | Attending: Family Medicine

## 2021-03-28 ENCOUNTER — Inpatient Hospital Stay (HOSPITAL_COMMUNITY): Payer: Self-pay

## 2021-03-28 ENCOUNTER — Encounter (HOSPITAL_COMMUNITY): Payer: Self-pay | Admitting: Cardiovascular Disease

## 2021-03-28 DIAGNOSIS — I5021 Acute systolic (congestive) heart failure: Secondary | ICD-10-CM

## 2021-03-28 DIAGNOSIS — I2510 Atherosclerotic heart disease of native coronary artery without angina pectoris: Secondary | ICD-10-CM

## 2021-03-28 HISTORY — PX: RIGHT/LEFT HEART CATH AND CORONARY ANGIOGRAPHY: CATH118266

## 2021-03-28 LAB — POCT I-STAT 7, (LYTES, BLD GAS, ICA,H+H)
Acid-Base Excess: 3 mmol/L — ABNORMAL HIGH (ref 0.0–2.0)
Bicarbonate: 27.9 mmol/L (ref 20.0–28.0)
Calcium, Ion: 1.18 mmol/L (ref 1.15–1.40)
HCT: 45 % (ref 39.0–52.0)
Hemoglobin: 15.3 g/dL (ref 13.0–17.0)
O2 Saturation: 97 %
Potassium: 3.7 mmol/L (ref 3.5–5.1)
Sodium: 141 mmol/L (ref 135–145)
TCO2: 29 mmol/L (ref 22–32)
pCO2 arterial: 42.8 mmHg (ref 32.0–48.0)
pH, Arterial: 7.422 (ref 7.350–7.450)
pO2, Arterial: 93 mmHg (ref 83.0–108.0)

## 2021-03-28 LAB — MAGNESIUM: Magnesium: 2.1 mg/dL (ref 1.7–2.4)

## 2021-03-28 LAB — BASIC METABOLIC PANEL
Anion gap: 6 (ref 5–15)
BUN: 13 mg/dL (ref 6–20)
CO2: 26 mmol/L (ref 22–32)
Calcium: 8.5 mg/dL — ABNORMAL LOW (ref 8.9–10.3)
Chloride: 104 mmol/L (ref 98–111)
Creatinine, Ser: 1.41 mg/dL — ABNORMAL HIGH (ref 0.61–1.24)
GFR, Estimated: 59 mL/min — ABNORMAL LOW (ref >60)
Glucose, Bld: 75 mg/dL (ref 70–99)
Potassium: 4.4 mmol/L (ref 3.5–5.1)
Sodium: 136 mmol/L (ref 135–145)

## 2021-03-28 LAB — ECHOCARDIOGRAM LIMITED
Height: 67 in
Weight: 3680 oz

## 2021-03-28 LAB — POCT I-STAT EG7
Acid-Base Excess: 4 mmol/L — ABNORMAL HIGH (ref 0.0–2.0)
Bicarbonate: 29.3 mmol/L — ABNORMAL HIGH (ref 20.0–28.0)
Calcium, Ion: 1.16 mmol/L (ref 1.15–1.40)
HCT: 45 % (ref 39.0–52.0)
Hemoglobin: 15.3 g/dL (ref 13.0–17.0)
O2 Saturation: 69 %
Potassium: 3.7 mmol/L (ref 3.5–5.1)
Sodium: 142 mmol/L (ref 135–145)
TCO2: 31 mmol/L (ref 22–32)
pCO2, Ven: 45.3 mmHg (ref 44.0–60.0)
pH, Ven: 7.418 (ref 7.250–7.430)
pO2, Ven: 36 mmHg (ref 32.0–45.0)

## 2021-03-28 LAB — COMPREHENSIVE METABOLIC PANEL
ALT: 31 U/L (ref 0–44)
AST: 33 U/L (ref 15–41)
Albumin: 2.8 g/dL — ABNORMAL LOW (ref 3.5–5.0)
Alkaline Phosphatase: 51 U/L (ref 38–126)
Anion gap: 7 (ref 5–15)
BUN: 13 mg/dL (ref 6–20)
CO2: 26 mmol/L (ref 22–32)
Calcium: 8.4 mg/dL — ABNORMAL LOW (ref 8.9–10.3)
Chloride: 104 mmol/L (ref 98–111)
Creatinine, Ser: 1.3 mg/dL — ABNORMAL HIGH (ref 0.61–1.24)
GFR, Estimated: >60 mL/min (ref >60)
Glucose, Bld: 85 mg/dL (ref 70–99)
Potassium: 3.7 mmol/L (ref 3.5–5.1)
Sodium: 137 mmol/L (ref 135–145)
Total Bilirubin: 0.7 mg/dL (ref 0.3–1.2)
Total Protein: 6 g/dL — ABNORMAL LOW (ref 6.5–8.1)

## 2021-03-28 SURGERY — RIGHT/LEFT HEART CATH AND CORONARY ANGIOGRAPHY
Anesthesia: LOCAL

## 2021-03-28 MED ORDER — POTASSIUM CHLORIDE CRYS ER 20 MEQ PO TBCR
40.0000 meq | EXTENDED_RELEASE_TABLET | Freq: Once | ORAL | Status: AC
  Administered 2021-03-28: 40 meq via ORAL
  Filled 2021-03-28: qty 2

## 2021-03-28 MED ORDER — HEPARIN (PORCINE) IN NACL 1000-0.9 UT/500ML-% IV SOLN
INTRAVENOUS | Status: DC | PRN
  Administered 2021-03-28 (×3): 500 mL

## 2021-03-28 MED ORDER — LABETALOL HCL 5 MG/ML IV SOLN: 20.0000 mg | INTRAVENOUS | Status: DC | PRN

## 2021-03-28 MED ORDER — HYDRALAZINE HCL 25 MG PO TABS
25.0000 mg | ORAL_TABLET | Freq: Three times a day (TID) | ORAL | Status: DC
  Administered 2021-03-28: 25 mg via ORAL
  Filled 2021-03-28: qty 1

## 2021-03-28 MED ORDER — SODIUM CHLORIDE 0.9% FLUSH: 3.0000 mL | INTRAVENOUS | Status: DC | PRN

## 2021-03-28 MED ORDER — MIDAZOLAM HCL 2 MG/2ML IJ SOLN
INTRAMUSCULAR | Status: DC | PRN
  Administered 2021-03-28: 1 mg via INTRAVENOUS

## 2021-03-28 MED ORDER — FENTANYL CITRATE (PF) 100 MCG/2ML IJ SOLN
INTRAMUSCULAR | Status: AC
  Filled 2021-03-28: qty 2

## 2021-03-28 MED ORDER — HEPARIN SODIUM (PORCINE) 1000 UNIT/ML IJ SOLN
INTRAMUSCULAR | Status: DC | PRN
  Administered 2021-03-28: 5000 [IU] via INTRAVENOUS

## 2021-03-28 MED ORDER — ONDANSETRON HCL 4 MG/2ML IJ SOLN: 4.0000 mg | Freq: Four times a day (QID) | INTRAMUSCULAR | Status: DC | PRN

## 2021-03-28 MED ORDER — SODIUM CHLORIDE 0.9 % IV SOLN
INTRAVENOUS | Status: AC | PRN
  Administered 2021-03-28: 250 mL via INTRAVENOUS

## 2021-03-28 MED ORDER — HEPARIN SODIUM (PORCINE) 1000 UNIT/ML IJ SOLN
INTRAMUSCULAR | Status: AC
  Filled 2021-03-28: qty 1

## 2021-03-28 MED ORDER — VERAPAMIL HCL 2.5 MG/ML IV SOLN
INTRAVENOUS | Status: DC | PRN
  Administered 2021-03-28: 10 mL via INTRA_ARTERIAL

## 2021-03-28 MED ORDER — FENTANYL CITRATE (PF) 100 MCG/2ML IJ SOLN
INTRAMUSCULAR | Status: DC | PRN
  Administered 2021-03-28: 50 ug via INTRAVENOUS

## 2021-03-28 MED ORDER — HEPARIN (PORCINE) IN NACL 1000-0.9 UT/500ML-% IV SOLN
INTRAVENOUS | Status: AC
  Filled 2021-03-28: qty 1000

## 2021-03-28 MED ORDER — SODIUM CHLORIDE 0.9 % IV SOLN: INTRAVENOUS | Status: AC

## 2021-03-28 MED ORDER — DAPAGLIFLOZIN PROPANEDIOL 10 MG PO TABS
10.0000 mg | ORAL_TABLET | Freq: Every day | ORAL | Status: DC
  Administered 2021-03-28 – 2021-03-29 (×2): 10 mg via ORAL
  Filled 2021-03-28 (×2): qty 1

## 2021-03-28 MED ORDER — IOHEXOL 350 MG/ML SOLN
INTRAVENOUS | Status: DC | PRN
  Administered 2021-03-28: 40 mL

## 2021-03-28 MED ORDER — LIDOCAINE HCL (PF) 1 % IJ SOLN
INTRAMUSCULAR | Status: DC | PRN
  Administered 2021-03-28 (×2): 2 mL

## 2021-03-28 MED ORDER — EMPAGLIFLOZIN 10 MG PO TABS: 10.0000 mg | ORAL_TABLET | Freq: Every day | ORAL | Status: DC

## 2021-03-28 MED ORDER — SODIUM CHLORIDE 0.9 % IV SOLN: 250.0000 mL | INTRAVENOUS | Status: DC | PRN

## 2021-03-28 MED ORDER — LIDOCAINE HCL (PF) 1 % IJ SOLN
INTRAMUSCULAR | Status: AC
  Filled 2021-03-28: qty 30

## 2021-03-28 MED ORDER — ISOSORBIDE MONONITRATE ER 30 MG PO TB24
30.0000 mg | ORAL_TABLET | Freq: Every day | ORAL | Status: DC
  Administered 2021-03-28: 30 mg via ORAL
  Filled 2021-03-28: qty 1

## 2021-03-28 MED ORDER — VERAPAMIL HCL 2.5 MG/ML IV SOLN
INTRAVENOUS | Status: AC
  Filled 2021-03-28: qty 2

## 2021-03-28 MED ORDER — PERFLUTREN LIPID MICROSPHERE
1.0000 mL | INTRAVENOUS | Status: AC | PRN
  Administered 2021-03-28: 3 mL via INTRAVENOUS
  Filled 2021-03-28: qty 10

## 2021-03-28 MED ORDER — HEPARIN (PORCINE) IN NACL 1000-0.9 UT/500ML-% IV SOLN
INTRAVENOUS | Status: AC
  Filled 2021-03-28: qty 500

## 2021-03-28 MED ORDER — MIDAZOLAM HCL 2 MG/2ML IJ SOLN
INTRAMUSCULAR | Status: AC
  Filled 2021-03-28: qty 2

## 2021-03-28 MED ORDER — CARVEDILOL 3.125 MG PO TABS
3.1250 mg | ORAL_TABLET | Freq: Two times a day (BID) | ORAL | Status: DC
  Administered 2021-03-28: 3.125 mg via ORAL
  Filled 2021-03-28: qty 1

## 2021-03-28 MED ORDER — SODIUM CHLORIDE 0.9% FLUSH
3.0000 mL | Freq: Two times a day (BID) | INTRAVENOUS | Status: DC
  Administered 2021-03-28 – 2021-03-29 (×3): 3 mL via INTRAVENOUS

## 2021-03-28 SURGICAL SUPPLY — 12 items
CATH INFINITI 5FR JK (CATHETERS) ×2 IMPLANT
CATH INFINITI JR4 5F (CATHETERS) ×2 IMPLANT
CATH SWAN GANZ 7F STRAIGHT (CATHETERS) ×2 IMPLANT
DEVICE RAD COMP TR BAND LRG (VASCULAR PRODUCTS) ×2 IMPLANT
GLIDESHEATH SLEND SS 6F .021 (SHEATH) ×2 IMPLANT
GLIDESHEATH SLENDER 7FR .021G (SHEATH) ×2 IMPLANT
GUIDEWIRE INQWIRE 1.5J.035X260 (WIRE) ×1 IMPLANT
INQWIRE 1.5J .035X260CM (WIRE) ×2
KIT HEART LEFT (KITS) ×2 IMPLANT
PACK CARDIAC CATHETERIZATION (CUSTOM PROCEDURE TRAY) ×2 IMPLANT
TRANSDUCER W/STOPCOCK (MISCELLANEOUS) ×2 IMPLANT
TUBING CIL FLEX 10 FLL-RA (TUBING) ×2 IMPLANT

## 2021-03-28 NOTE — Progress Notes (Signed)
Cardiology Progress Note  Patient ID: Andres Roman MRN: [id number] DOB: Nov 04, 1966 Date of Encounter: 03/28/2021  Primary Cardiologist: None  Subjective   Chief Complaint: Shortness of breath  HPI: Net -2.5 L.  Still short of breath.  Plans for left and right heart catheterization today.  Creatinine is coming down.  ROS:  All other ROS reviewed and negative. Pertinent positives noted in the HPI.     Inpatient Medications  Scheduled Meds: . enoxaparin (LOVENOX) injection  50 mg Subcutaneous Q24H  . furosemide  80 mg Intravenous BID  . montelukast  10 mg Oral QHS  . pantoprazole  40 mg Oral Daily  . rosuvastatin  20 mg Oral Daily  . sacubitril-valsartan  1 tablet Oral BID  . sodium chloride flush  3 mL Intravenous Q12H  . spironolactone  25 mg Oral Daily   Continuous Infusions: . sodium chloride    . sodium chloride 50 mL/hr at 03/28/21 0614   PRN Meds: sodium chloride, acetaminophen, albuterol, labetalol, sodium chloride flush   Vital Signs   Vitals:   03/27/21 1323 03/27/21 1651 03/27/21 2024 03/28/21 0624  BP: (!) 123/95 (!) 138/105 (!) 137/99 (!) 129/106  Pulse: (!) 105  99 (!) 103  Resp: 18  (!) 22 20  Temp: 98.1 F (36.7 C)  (!) 97.5 F (36.4 C) 98.1 F (36.7 C)  TempSrc: Oral  Oral Oral  SpO2:   98% 99%  Weight:    104.3 kg  Height:        Intake/Output Summary (Last 24 hours) at 03/28/2021 0745 Last data filed at 03/28/2021 0165 Gross per 24 hour  Intake 967.6 ml  Output 3300 ml  Net -2332.4 ml   Last 3 Weights 03/28/2021 03/27/2021 03/26/2021  Weight (lbs) 230 lb 233 lb 11 oz 231 lb 9.6 oz  Weight (kg) 104.327 kg 106 kg 105.053 kg      Telemetry  Overnight telemetry shows sinus tachycardia, which I personally reviewed.   Physical Exam   Vitals:   03/27/21 1323 03/27/21 1651 03/27/21 2024 03/28/21 0624  BP: (!) 123/95 (!) 138/105 (!) 137/99 (!) 129/106  Pulse: (!) 105  99 (!) 103  Resp: 18  (!) 22 20  Temp: 98.1 F (36.7 C)  (!) 97.5 F  (36.4 C) 98.1 F (36.7 C)  TempSrc: Oral  Oral Oral  SpO2:   98% 99%  Weight:    104.3 kg  Height:         Intake/Output Summary (Last 24 hours) at 03/28/2021 0745 Last data filed at 03/28/2021 5374 Gross per 24 hour  Intake 967.6 ml  Output 3300 ml  Net -2332.4 ml    Last 3 Weights 03/28/2021 03/27/2021 03/26/2021  Weight (lbs) 230 lb 233 lb 11 oz 231 lb 9.6 oz  Weight (kg) 104.327 kg 106 kg 105.053 kg    Body mass index is 36.02 kg/m.   General: Well nourished, well developed, in no acute distress Head: Atraumatic, normal size  Eyes: PEERLA, EOMI  Neck: Supple, JVD 7 to 8 cm of water Endocrine: No thryomegaly Cardiac: Normal S1, S2; RRR; 2 out of 6 holosystolic murmur Lungs: Diminished breath sounds at the lung bases Abd: Soft, nontender, no hepatomegaly  Ext: Trace edema Musculoskeletal: No deformities, BUE and BLE strength normal and equal Skin: Warm and dry, no rashes   Neuro: Alert and oriented to person, place, time, and situation, CNII-XII grossly intact, no focal deficits  Psych: Normal mood and affect   Labs  High Sensitivity  Troponin:   Recent Labs  Lab 03/25/21 0809 03/25/21 1006  TROPONINIHS 42* 15     Cardiac EnzymesNo results for input(s): TROPONINI in the last 168 hours. No results for input(s): TROPIPOC in the last 168 hours.  Chemistry Recent Labs  Lab 03/25/21 0809 03/26/21 0919 03/27/21 0351 03/28/21 0448  NA 136 140 139 137  K 3.9 3.4* 3.7 3.7  CL 104 104 106 104  CO2 22 25 26 26   GLUCOSE 123* 126* 105* 85  BUN 10 11 17 13   CREATININE 1.29* 1.48* 1.46* 1.30*  CALCIUM 8.5* 8.7* 8.0* 8.4*  PROT 6.4*  --   --  6.0*  ALBUMIN 3.0*  --   --  2.8*  AST 40  --   --  33  ALT 33  --   --  31  ALKPHOS 59  --   --  51  BILITOT 1.5*  --   --  0.7  GFRNONAA >60 56* 56* >60  ANIONGAP 10 11 7 7     Hematology Recent Labs  Lab 03/25/21 0809 03/26/21 0919  WBC 8.2 8.5  RBC 5.25 5.44  HGB 14.2 14.6  HCT 43.7 45.9  MCV 83.2 84.4  MCH 27.0 26.8   MCHC 32.5 31.8  RDW 15.7* 15.8*  PLT 335 331   BNP Recent Labs  Lab 03/25/21 0809  BNP 1,966.6*    DDimer No results for input(s): DDIMER in the last 168 hours.   Radiology  ECHOCARDIOGRAM COMPLETE  Result Date: 03/26/2021    ECHOCARDIOGRAM REPORT   Patient Name:   Andres Roman Date of Exam: 03/26/2021 Medical Rec #:  03/27/21     Height:       67.0 in Accession #:    03/28/2021    Weight:       231.6 lb Date of Birth:  Apr 01, 1966     BSA:          2.152 m Patient Age:    55 years      BP:           131/94 mmHg Patient Gender: M             HR:           100 bpm. Exam Location:  Inpatient Procedure: 2D Echo, Cardiac Doppler and Color Doppler Indications:    CHF  History:        Patient has no prior history of Echocardiogram examinations.                 Risk Factors:Diabetes and Hypertension.  Sonographer:    [id number] RDCS Referring Phys: [id number] KUBER GHIMIRE IMPRESSIONS  1. Endocardium is difficult to visualize. 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 Would recomm limited echo with Definity to confirm. . Left ventricular ejection fraction, by estimation, is 45%. The left ventricle has mildly decreased function. The left ventricular internal cavity size was moderately dilated. Left ventricular diastolic parameters are indeterminate.  2. Right ventricular systolic function is normal. The right ventricular size is normal. There is normal pulmonary artery systolic pressure.  3. Left atrial size was severely dilated.  4. Right atrial size was moderately dilated.  5. MR is eccentric, directed posteriorly to back of left atrium . The mitral valve is normal in structure. Moderate to severe mitral valve regurgitation.  6. The aortic valve is tricuspid. Aortic valve regurgitation is mild. Mild aortic valve sclerosis is present, with no evidence of aortic valve  stenosis. FINDINGS  Left Ventricle: Endocardium is difficult to visualize. There appears  to be hypokiensis of the inferoseptal (base/mid) and inferior (base/mid) walls. Would recomm limited echo with Definity to confirm. Left ventricular ejection fraction, by estimation, is 45%. The left ventricle has mildly decreased function. The left ventricular internal cavity size was moderately dilated. There is no left ventricular hypertrophy. Left ventricular diastolic parameters are indeterminate. Right Ventricle: The right ventricular size is normal. No increase in right ventricular wall thickness. Right ventricular systolic function is normal. There is normal pulmonary artery systolic pressure. The tricuspid regurgitant velocity is 2.84 m/s, and  with an assumed right atrial pressure of 3 mmHg, the estimated right ventricular systolic pressure is 35.3 mmHg. Left Atrium: Left atrial size was severely dilated. Right Atrium: Right atrial size was moderately dilated. Pericardium: There is no evidence of pericardial effusion. Mitral Valve: MR is eccentric, directed posteriorly to back of left atrium. The mitral valve is normal in structure. Moderate to severe mitral valve regurgitation. Tricuspid Valve: The tricuspid valve is normal in structure. Tricuspid valve regurgitation is mild. Aortic Valve: The aortic valve is tricuspid. Aortic valve regurgitation is mild. Mild aortic valve sclerosis is present, with no evidence of aortic valve stenosis. Aortic valve mean gradient measures 4.0 mmHg. Aortic valve peak gradient measures 6.2 mmHg. Aortic valve area, by VTI measures 2.37 cm. Pulmonic Valve: The pulmonic valve was normal in structure. Pulmonic valve regurgitation is mild. Aorta: The aortic root and ascending aorta are structurally normal, with no evidence of dilitation. IAS/Shunts: No atrial level shunt detected by color flow Doppler.  LEFT VENTRICLE PLAX 2D LVIDd:         5.90 cm      Diastology LVIDs:         5.00 cm      LV e' medial:    8.05 cm/s LV PW:         1.10 cm      LV E/e' medial:  11.4 LV IVS:         0.90 cm      LV e' lateral:   10.40 cm/s LVOT diam:     2.25 cm      LV E/e' lateral: 8.9 LV SV:         47 LV SV Index:   22 LVOT Area:     3.98 cm  LV Volumes (MOD) LV vol d, MOD A2C: 157.0 ml LV vol d, MOD A4C: 138.0 ml LV vol s, MOD A2C: 125.0 ml LV vol s, MOD A4C: 93.0 ml LV SV MOD A2C:     32.0 ml LV SV MOD A4C:     138.0 ml LV SV MOD BP:      39.3 ml RIGHT VENTRICLE RV S prime:     14.80 cm/s TAPSE (M-mode): 1.8 cm LEFT ATRIUM              Index       RIGHT ATRIUM           Index LA diam:        5.20 cm  2.42 cm/m  RA Area:     26.10 cm LA Vol (A2C):   95.1 ml  44.18 ml/m RA Volume:   97.30 ml  45.21 ml/m LA Vol (A4C):   149.0 ml 69.23 ml/m LA Biplane Vol: 121.0 ml 56.22 ml/m  AORTIC VALVE AV Area (Vmax):    2.61 cm AV Area (Vmean):   2.36 cm AV Area (VTI):  2.37 cm AV Vmax:           125.00 cm/s AV Vmean:          91.200 cm/s AV VTI:            0.198 m AV Peak Grad:      6.2 mmHg AV Mean Grad:      4.0 mmHg LVOT Vmax:         82.20 cm/s LVOT Vmean:        54.200 cm/s LVOT VTI:          0.118 m LVOT/AV VTI ratio: 0.60  AORTA Ao Root diam: 3.30 cm Ao Asc diam:  3.00 cm MR Peak grad:    114.9 mmHg  TRICUSPID VALVE MR Mean grad:    74.0 mmHg   TR Peak grad:   32.3 mmHg MR Vmax:         536.00 cm/s TR Vmax:        284.00 cm/s MR Vmean:        399.0 cm/s MR PISA:         0.57 cm    SHUNTS MR PISA Eff ROA: 3 mm       Systemic VTI:  0.12 m MR PISA Radius:  0.30 cm     Systemic Diam: 2.25 cm MV E velocity: 92.10 cm/s Dietrich Pates MD Electronically signed by Dietrich Pates MD Signature Date/Time: 03/26/2021/7:18:22 PM    Final     Cardiac Studies  TTE 03/26/2021  1. Endocardium is difficult to visualize. There appears to be hypokiensis  of the inferoseptal (base/mid) and inferior (base/mid) walls. Would recomm  limited echo with Definity to confirm. . Left ventricular ejection  fraction, by estimation, is 45%. The  left ventricle has mildly decreased function. The left ventricular  internal  cavity size was moderately dilated. Left ventricular diastolic  parameters are indeterminate.  2. Right ventricular systolic function is normal. The right ventricular  size is normal. There is normal pulmonary artery systolic pressure.  3. Left atrial size was severely dilated.  4. Right atrial size was moderately dilated.  5. MR is eccentric, directed posteriorly to back of left atrium . The  mitral valve is normal in structure. Moderate to severe mitral valve  regurgitation.  6. The aortic valve is tricuspid. Aortic valve regurgitation is mild.  Mild aortic valve sclerosis is present, with no evidence of aortic valve  stenosis.   Patient Profile  55 year old male with uncontrolled hypertension, poor medication compliance was admitted on 03/25/2021 for new onset systolic heart failure.   Assessment & Plan   1.  New onset systolic heart failure, EF 30% on my review -Suspect this is due to uncontrolled hypertension.  Has had hypertension for years.  Echo on my review with EF 30%. -Troponins negative.  Doubt ischemia.  EKG with LVH. -He has several red flag symptoms including tachycardia as well as narrow pulse pressure. -I would like to proceed with left and right heart catheterization today. -Hold beta-blocker due to tachycardia.  We will better assess hemodynamics and right heart catheterization. -Continue Entresto 49-51 mg twice daily.  We will continue Aldactone 25 mg daily.  Continue Lasix 80 mg IV twice daily. -Creatinine is coming down. -We will start Farxiga 10 mg daily. -I have asked pharmacy to assist with medications.  He does not have health insurance.  2.  Moderate to severe MR -Suspect this is due to his cardiomyopathy.  The posterior mitral valve leaflet is restricted in  systole (3B pathology) -He does have a prominent murmur.  Would recommend medical therapy first.  This is not prolapse of the anterior mitral valve leaflet.  For questions or updates, please contact  [facility] Please consult [email or web address] for contact info under   Time Spent with Patient: I have spent a total of 25 minutes with patient reviewing hospital notes, telemetry, EKGs, labs and examining the patient as well as establishing an assessment and plan that was discussed with the patient.  > 50% of time was spent in direct patient care.    Signed, [place] O'[place]al, MD, FACC [place]  [facility]  03/28/2021 7:45 AM   

## 2021-03-28 NOTE — Interval H&P Note (Signed)
History and Physical Interval Note:  03/28/2021 10:16 AM  Andres Roman  has presented today for surgery, with the diagnosis of chest pain - chf.  The various methods of treatment have been discussed with the patient and family. After consideration of risks, benefits and other options for treatment, the patient has consented to  Procedure(s): RIGHT/LEFT HEART CATH AND CORONARY ANGIOGRAPHY (N/A) as a surgical intervention.  The patient's history has been reviewed, patient examined, no change in status, stable for surgery.  I have reviewed the patient's chart and labs.  Questions were answered to the patient's satisfaction.     Lorine Bears

## 2021-03-28 NOTE — H&P (View-Only) (Signed)
Cardiology Progress Note  Patient ID: Andres Roman MRN: [id number] DOB: Nov 04, 1966 Date of Encounter: 03/28/2021  Primary Cardiologist: None  Subjective   Chief Complaint: Shortness of breath  HPI: Net -2.5 L.  Still short of breath.  Plans for left and right heart catheterization today.  Creatinine is coming down.  ROS:  All other ROS reviewed and negative. Pertinent positives noted in the HPI.     Inpatient Medications  Scheduled Meds: . enoxaparin (LOVENOX) injection  50 mg Subcutaneous Q24H  . furosemide  80 mg Intravenous BID  . montelukast  10 mg Oral QHS  . pantoprazole  40 mg Oral Daily  . rosuvastatin  20 mg Oral Daily  . sacubitril-valsartan  1 tablet Oral BID  . sodium chloride flush  3 mL Intravenous Q12H  . spironolactone  25 mg Oral Daily   Continuous Infusions: . sodium chloride    . sodium chloride 50 mL/hr at 03/28/21 0614   PRN Meds: sodium chloride, acetaminophen, albuterol, labetalol, sodium chloride flush   Vital Signs   Vitals:   03/27/21 1323 03/27/21 1651 03/27/21 2024 03/28/21 0624  BP: (!) 123/95 (!) 138/105 (!) 137/99 (!) 129/106  Pulse: (!) 105  99 (!) 103  Resp: 18  (!) 22 20  Temp: 98.1 F (36.7 C)  (!) 97.5 F (36.4 C) 98.1 F (36.7 C)  TempSrc: Oral  Oral Oral  SpO2:   98% 99%  Weight:    104.3 kg  Height:        Intake/Output Summary (Last 24 hours) at 03/28/2021 0745 Last data filed at 03/28/2021 0165 Gross per 24 hour  Intake 967.6 ml  Output 3300 ml  Net -2332.4 ml   Last 3 Weights 03/28/2021 03/27/2021 03/26/2021  Weight (lbs) 230 lb 233 lb 11 oz 231 lb 9.6 oz  Weight (kg) 104.327 kg 106 kg 105.053 kg      Telemetry  Overnight telemetry shows sinus tachycardia, which I personally reviewed.   Physical Exam   Vitals:   03/27/21 1323 03/27/21 1651 03/27/21 2024 03/28/21 0624  BP: (!) 123/95 (!) 138/105 (!) 137/99 (!) 129/106  Pulse: (!) 105  99 (!) 103  Resp: 18  (!) 22 20  Temp: 98.1 F (36.7 C)  (!) 97.5 F  (36.4 C) 98.1 F (36.7 C)  TempSrc: Oral  Oral Oral  SpO2:   98% 99%  Weight:    104.3 kg  Height:         Intake/Output Summary (Last 24 hours) at 03/28/2021 0745 Last data filed at 03/28/2021 5374 Gross per 24 hour  Intake 967.6 ml  Output 3300 ml  Net -2332.4 ml    Last 3 Weights 03/28/2021 03/27/2021 03/26/2021  Weight (lbs) 230 lb 233 lb 11 oz 231 lb 9.6 oz  Weight (kg) 104.327 kg 106 kg 105.053 kg    Body mass index is 36.02 kg/m.   General: Well nourished, well developed, in no acute distress Head: Atraumatic, normal size  Eyes: PEERLA, EOMI  Neck: Supple, JVD 7 to 8 cm of water Endocrine: No thryomegaly Cardiac: Normal S1, S2; RRR; 2 out of 6 holosystolic murmur Lungs: Diminished breath sounds at the lung bases Abd: Soft, nontender, no hepatomegaly  Ext: Trace edema Musculoskeletal: No deformities, BUE and BLE strength normal and equal Skin: Warm and dry, no rashes   Neuro: Alert and oriented to person, place, time, and situation, CNII-XII grossly intact, no focal deficits  Psych: Normal mood and affect   Labs  High Sensitivity  Troponin:   Recent Labs  Lab 03/25/21 0809 03/25/21 1006  TROPONINIHS 42* 15     Cardiac EnzymesNo results for input(s): TROPONINI in the last 168 hours. No results for input(s): TROPIPOC in the last 168 hours.  Chemistry Recent Labs  Lab 03/25/21 0809 03/26/21 0919 03/27/21 0351 03/28/21 0448  NA 136 140 139 137  K 3.9 3.4* 3.7 3.7  CL 104 104 106 104  CO2 22 25 26 26   GLUCOSE 123* 126* 105* 85  BUN 10 11 17 13   CREATININE 1.29* 1.48* 1.46* 1.30*  CALCIUM 8.5* 8.7* 8.0* 8.4*  PROT 6.4*  --   --  6.0*  ALBUMIN 3.0*  --   --  2.8*  AST 40  --   --  33  ALT 33  --   --  31  ALKPHOS 59  --   --  51  BILITOT 1.5*  --   --  0.7  GFRNONAA >60 56* 56* >60  ANIONGAP 10 11 7 7     Hematology Recent Labs  Lab 03/25/21 0809 03/26/21 0919  WBC 8.2 8.5  RBC 5.25 5.44  HGB 14.2 14.6  HCT 43.7 45.9  MCV 83.2 84.4  MCH 27.0 26.8   MCHC 32.5 31.8  RDW 15.7* 15.8*  PLT 335 331   BNP Recent Labs  Lab 03/25/21 0809  BNP 1,966.6*    DDimer No results for input(s): DDIMER in the last 168 hours.   Radiology  ECHOCARDIOGRAM COMPLETE  Result Date: 03/26/2021    ECHOCARDIOGRAM REPORT   Patient Name:   Andres Roman Date of Exam: 03/26/2021 Medical Rec #:  03/27/21     Height:       67.0 in Accession #:    03/28/2021    Weight:       231.6 lb Date of Birth:  Apr 01, 1966     BSA:          2.152 m Patient Age:    55 years      BP:           131/94 mmHg Patient Gender: M             HR:           100 bpm. Exam Location:  Inpatient Procedure: 2D Echo, Cardiac Doppler and Color Doppler Indications:    CHF  History:        Patient has no prior history of Echocardiogram examinations.                 Risk Factors:Diabetes and Hypertension.  Sonographer:    [id number] RDCS Referring Phys: [id number] KUBER GHIMIRE IMPRESSIONS  1. Endocardium is difficult to visualize. 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 Would recomm limited echo with Definity to confirm. . Left ventricular ejection fraction, by estimation, is 45%. The left ventricle has mildly decreased function. The left ventricular internal cavity size was moderately dilated. Left ventricular diastolic parameters are indeterminate.  2. Right ventricular systolic function is normal. The right ventricular size is normal. There is normal pulmonary artery systolic pressure.  3. Left atrial size was severely dilated.  4. Right atrial size was moderately dilated.  5. MR is eccentric, directed posteriorly to back of left atrium . The mitral valve is normal in structure. Moderate to severe mitral valve regurgitation.  6. The aortic valve is tricuspid. Aortic valve regurgitation is mild. Mild aortic valve sclerosis is present, with no evidence of aortic valve  stenosis. FINDINGS  Left Ventricle: Endocardium is difficult to visualize. There appears  to be hypokiensis of the inferoseptal (base/mid) and inferior (base/mid) walls. Would recomm limited echo with Definity to confirm. Left ventricular ejection fraction, by estimation, is 45%. The left ventricle has mildly decreased function. The left ventricular internal cavity size was moderately dilated. There is no left ventricular hypertrophy. Left ventricular diastolic parameters are indeterminate. Right Ventricle: The right ventricular size is normal. No increase in right ventricular wall thickness. Right ventricular systolic function is normal. There is normal pulmonary artery systolic pressure. The tricuspid regurgitant velocity is 2.84 m/s, and  with an assumed right atrial pressure of 3 mmHg, the estimated right ventricular systolic pressure is 35.3 mmHg. Left Atrium: Left atrial size was severely dilated. Right Atrium: Right atrial size was moderately dilated. Pericardium: There is no evidence of pericardial effusion. Mitral Valve: MR is eccentric, directed posteriorly to back of left atrium. The mitral valve is normal in structure. Moderate to severe mitral valve regurgitation. Tricuspid Valve: The tricuspid valve is normal in structure. Tricuspid valve regurgitation is mild. Aortic Valve: The aortic valve is tricuspid. Aortic valve regurgitation is mild. Mild aortic valve sclerosis is present, with no evidence of aortic valve stenosis. Aortic valve mean gradient measures 4.0 mmHg. Aortic valve peak gradient measures 6.2 mmHg. Aortic valve area, by VTI measures 2.37 cm. Pulmonic Valve: The pulmonic valve was normal in structure. Pulmonic valve regurgitation is mild. Aorta: The aortic root and ascending aorta are structurally normal, with no evidence of dilitation. IAS/Shunts: No atrial level shunt detected by color flow Doppler.  LEFT VENTRICLE PLAX 2D LVIDd:         5.90 cm      Diastology LVIDs:         5.00 cm      LV e' medial:    8.05 cm/s LV PW:         1.10 cm      LV E/e' medial:  11.4 LV IVS:         0.90 cm      LV e' lateral:   10.40 cm/s LVOT diam:     2.25 cm      LV E/e' lateral: 8.9 LV SV:         47 LV SV Index:   22 LVOT Area:     3.98 cm  LV Volumes (MOD) LV vol d, MOD A2C: 157.0 ml LV vol d, MOD A4C: 138.0 ml LV vol s, MOD A2C: 125.0 ml LV vol s, MOD A4C: 93.0 ml LV SV MOD A2C:     32.0 ml LV SV MOD A4C:     138.0 ml LV SV MOD BP:      39.3 ml RIGHT VENTRICLE RV S prime:     14.80 cm/s TAPSE (M-mode): 1.8 cm LEFT ATRIUM              Index       RIGHT ATRIUM           Index LA diam:        5.20 cm  2.42 cm/m  RA Area:     26.10 cm LA Vol (A2C):   95.1 ml  44.18 ml/m RA Volume:   97.30 ml  45.21 ml/m LA Vol (A4C):   149.0 ml 69.23 ml/m LA Biplane Vol: 121.0 ml 56.22 ml/m  AORTIC VALVE AV Area (Vmax):    2.61 cm AV Area (Vmean):   2.36 cm AV Area (VTI):  2.37 cm AV Vmax:           125.00 cm/s AV Vmean:          91.200 cm/s AV VTI:            0.198 m AV Peak Grad:      6.2 mmHg AV Mean Grad:      4.0 mmHg LVOT Vmax:         82.20 cm/s LVOT Vmean:        54.200 cm/s LVOT VTI:          0.118 m LVOT/AV VTI ratio: 0.60  AORTA Ao Root diam: 3.30 cm Ao Asc diam:  3.00 cm MR Peak grad:    114.9 mmHg  TRICUSPID VALVE MR Mean grad:    74.0 mmHg   TR Peak grad:   32.3 mmHg MR Vmax:         536.00 cm/s TR Vmax:        284.00 cm/s MR Vmean:        399.0 cm/s MR PISA:         0.57 cm    SHUNTS MR PISA Eff ROA: 3 mm       Systemic VTI:  0.12 m MR PISA Radius:  0.30 cm     Systemic Diam: 2.25 cm MV E velocity: 92.10 cm/s Dietrich Pates MD Electronically signed by Dietrich Pates MD Signature Date/Time: 03/26/2021/7:18:22 PM    Final     Cardiac Studies  TTE 03/26/2021  1. Endocardium is difficult to visualize. There appears to be hypokiensis  of the inferoseptal (base/mid) and inferior (base/mid) walls. Would recomm  limited echo with Definity to confirm. . Left ventricular ejection  fraction, by estimation, is 45%. The  left ventricle has mildly decreased function. The left ventricular  internal  cavity size was moderately dilated. Left ventricular diastolic  parameters are indeterminate.  2. Right ventricular systolic function is normal. The right ventricular  size is normal. There is normal pulmonary artery systolic pressure.  3. Left atrial size was severely dilated.  4. Right atrial size was moderately dilated.  5. MR is eccentric, directed posteriorly to back of left atrium . The  mitral valve is normal in structure. Moderate to severe mitral valve  regurgitation.  6. The aortic valve is tricuspid. Aortic valve regurgitation is mild.  Mild aortic valve sclerosis is present, with no evidence of aortic valve  stenosis.   Patient Profile  55 year old male with uncontrolled hypertension, poor medication compliance was admitted on 03/25/2021 for new onset systolic heart failure.   Assessment & Plan   1.  New onset systolic heart failure, EF 30% on my review -Suspect this is due to uncontrolled hypertension.  Has had hypertension for years.  Echo on my review with EF 30%. -Troponins negative.  Doubt ischemia.  EKG with LVH. -He has several red flag symptoms including tachycardia as well as narrow pulse pressure. -I would like to proceed with left and right heart catheterization today. -Hold beta-blocker due to tachycardia.  We will better assess hemodynamics and right heart catheterization. -Continue Entresto 49-51 mg twice daily.  We will continue Aldactone 25 mg daily.  Continue Lasix 80 mg IV twice daily. -Creatinine is coming down. -We will start Farxiga 10 mg daily. -I have asked pharmacy to assist with medications.  He does not have health insurance.  2.  Moderate to severe MR -Suspect this is due to his cardiomyopathy.  The posterior mitral valve leaflet is restricted in  systole (3B pathology) -He does have a prominent murmur.  Would recommend medical therapy first.  This is not prolapse of the anterior mitral valve leaflet.  For questions or updates, please contact  [facility] Please consult [email or web address] for contact info under   Time Spent with Patient: I have spent a total of 25 minutes with patient reviewing hospital notes, telemetry, EKGs, labs and examining the patient as well as establishing an assessment and plan that was discussed with the patient.  > 50% of time was spent in direct patient care.    Signed, Lenna GilfordWesley T. Flora Lipps'Neal, MD, [facility] HospitalFACC [place]  [facility]  03/28/2021 7:45 AM

## 2021-03-28 NOTE — Progress Notes (Signed)
Nutrition Education Note  RD consulted for nutrition education regarding CHF.  RD attempted to speak with pt x 2, however, pt out of room at times of both visits. No family present during visit.   RD provided "Low Sodium Nutrition Therapy" handout from the Academy of [employer] Diet[employer]. Attached handout to AVS/ discharge summary.   RD will re-attempt to speak with pt in person as time allows.   Body mass index is 36.02 kg/m. Pt meets criteria for obesity, class II based on current BMI.  Current diet order is NPO, patient is consuming approximately n/a% of meals at this time. Labs and medications reviewed. No further nutrition interventions warranted at this time. RD contact information provided. If additional nutrition issues arise, please re-consult RD.   Levada Schilling, RD, LDN, CDCES Registered Dietitian II Certified Diabetes Care and Education Specialist Please refer to [facility] for RD and/or RD on-call/weekend/after hours pager

## 2021-03-28 NOTE — Progress Notes (Addendum)
Heart Failure Stewardship Pharmacist Progress Note   PCP: Gillian Scarce, MD PCP-Cardiologist: None    HPI:  55 yo M with PMH of HTN, HLD, and vitamin D deficiency. He presented to the ED on 03/25/21 with shortness of breath, orthopnea, and BLE edema. A CXR was done on 03/25/21 and showed mild interstitial edema. An ECHO was done on 03/26/21 and LVEF is reduced to 45% with normal RV systolic function. S/p R/LHC today.  Current HF Medications: IV furosemide 80 mg BID Entresto 49/51 mg BID Spironolactone 25 mg daily Farxiga 10 mg daily Hydralazine 25 mg q8h Isosorbide mononitrate 30 mg daily  Prior to admission HF Medications: Losartan 25 mg daily  Pertinent Lab Values: . Serum creatinine 1.3, BUN 13, Potassium 3.7, Sodium 137, BNP 1966.6, Magnesium 1.9   Vital Signs: . Weight: 230 lbs (admission weight: 237 lbs) . Blood pressure: 120/100s  . Heart rate: 100s   Medication Assistance / Insurance Benefits Check: Does the patient have prescription insurance?  No  Does the patient qualify for medication assistance through manufacturers or grants?   Pending . Eligible grants and/or patient assistance programs: pending . Medication assistance applications in progress: none  . Medication assistance applications approved: none Approved medication assistance renewals will be completed by: pending  Outpatient Pharmacy:  Prior to admission [facility] Is the patient willing to use [facility] pharmacy at discharge? Yes Is the patient willing to transition their outpatient pharmacy to utilize a [facility] outpatient pharmacy?   Pending    Assessment: 1. Acute systolic CHF (EF 61%), due to unknown etiology. No ischemic evaluation completed yet. NYHA class II symptoms. - RA 1 on RHC, PCW 3 - stop IV furosemide - CO 5.1, CI 3.4 and euvolemic on RHC - consider starting carvedilol 6.25 mg BID - Agree with switching losartan to Entresto 49/51 mg BID - Continue  spironolactone 25 mg daily  - Agree with starting Farxiga 10 mg daily - Continue hydralazine 25 mg q8h and isosorbide mononitrate 30 mg daily   Plan: 1) Medication changes recommended at this time: - Stop IV furosemide  - Agree with switching losartan to Entresto 49/51 mg BID and starting Farxiga 10 mg daily - Start carvedilol 6.25 mg BID prior to discharge  2) Patient assistance: - Appears patient is uninsured. Will help him enroll in patient assistance programs for Entresto/Farxiga at HF [facility] appointment on 04/03/21.  - Will consult HF CSW to help initiate long term insurance plan for him  3)  Education  - To be completed prior to discharge  Tama Headings, PharmD, BCPS Sharen Hones, PharmD, BCPS Heart Failure Stewardship Pharmacist Phone [phone]

## 2021-03-28 NOTE — Plan of Care (Signed)

## 2021-03-28 NOTE — Progress Notes (Signed)
  Echocardiogram 2D Echocardiogram has been performed with Definity.  Gerda Diss 03/28/2021, 8:17 AM

## 2021-03-28 NOTE — Discharge Instructions (Signed)

## 2021-03-28 NOTE — Progress Notes (Signed)
PROGRESS NOTE    Andres Roman  VHQ:[id number] DOB: 1966-01-24 DOA: 03/25/2021 PCP: Gillian Scarce, MD     Brief Narrative:  Andres Roman is a 55 year old male history of hypertension, hyperlipidemia, vitamin D deficiency presented to ED with about 1 month of shortness of breath and gradually progressive symptoms.  As per patient he has not been taking blood pressure medications. He started developing shortness of breath with exertion, went to urgent care a month ago and was treated with prednisone, albuterol and was given blood pressure medications.  Despite taking all his medications he continues to have dyspnea on exertion denies PND.  Echocardiogram obtained yesterday showed EF 45%, hypokinesis of the inferoseptal and inferior walls.  Left ventricular function is mildly decreased, left ventricle dilated.  Also has biatrial dilation, MR. Patient was admitted for acute systolic heart failure.  Cardiology consulted.  New events last 24 hours / Subjective: Patient seen post heart cath.  States that his swelling of his legs have improved, denies any chest pain or worsening shortness of breath this afternoon.  Assessment & Plan:   Principal Problem:   CHF (congestive heart failure) (HCC) Active Problems:   Essential hypertension, benign   Acute systolic heart failure (HCC)   New onset acute systolic heart failure, NICM -BNP 1966.6 -Status post heart cath 5/18 -Cardiology following -Continue Aldactone, Entresto, Coreg, Farxiga  Uncontrolled hypertension -Due to medical noncompliance -Continue spironolactone, Entresto, Coreg -Blood pressure improved  Hyperlipidemia -Continue Crestor  CKD stage IIIa -Stable  Prediabetes -Hemoglobin A1c 6.1 -Farxiga   DVT prophylaxis: Lovenox   Code Status:     Code Status Orders  (From admission, onward)         Start     Ordered   03/25/21 1635  Full code  Continuous        03/25/21 1639        Code Status History     This patient has a current code status but no historical code status.   Advance Care Planning Activity     Family Communication: None at bedside Disposition Plan:  Status is: Inpatient  Remains inpatient appropriate because:IV treatments appropriate due to intensity of illness or inability to take PO and Inpatient level of care appropriate due to severity of illness   Dispo: The patient is from: Home              Anticipated d/c is to: Home              Patient currently is not medically stable to d/c.   Difficult to place patient No    Antimicrobials:  Anti-infectives (From admission, onward)   None        Objective: Vitals:   03/28/21 1200 03/28/21 1215 03/28/21 1245 03/28/21 1300  BP: 102/77 109/78 115/84 109/84  Pulse: 94 96 91 93  Resp:      Temp:      TempSrc:      SpO2: 96% 92% 99% 96%  Weight:      Height:        Intake/Output Summary (Last 24 hours) at 03/28/2021 1526 Last data filed at 03/28/2021 1433 Gross per 24 hour  Intake 663.93 ml  Output 5775 ml  Net -5111.07 ml   Filed Weights   03/26/21 0500 03/27/21 0500 03/28/21 0624  Weight: 105.1 kg 106 kg 104.3 kg    Examination:  General exam: Appears calm and comfortable  Respiratory system: Clear to auscultation. Respiratory effort normal. No respiratory distress. No  conversational dyspnea.  Cardiovascular system: S1 & S2 heard, RRR. No murmurs. No pedal edema. Gastrointestinal system: Abdomen is nondistended, soft and nontender. Normal bowel sounds heard. Central nervous system: Alert and oriented. No focal neurological deficits. Speech clear.  Extremities: Symmetric in appearance  Skin: No rashes, lesions or ulcers on exposed skin  Psychiatry: Judgement and insight appear normal. Mood & affect appropriate.   Data Reviewed: I have personally reviewed following labs and imaging studies  CBC: Recent Labs  Lab 03/25/21 0809 03/26/21 0919  WBC 8.2 8.5  NEUTROABS 3.2  --   HGB 14.2 14.6  HCT  43.7 45.9  MCV 83.2 84.4  PLT 335 331   Basic Metabolic Panel: Recent Labs  Lab 03/25/21 0809 03/26/21 0919 03/27/21 0351 03/28/21 0448  NA 136 140 139 137  K 3.9 3.4* 3.7 3.7  CL 104 104 106 104  CO2 22 25 26 26   GLUCOSE 123* 126* 105* 85  BUN 10 11 17 13   CREATININE 1.29* 1.48* 1.46* 1.30*  CALCIUM 8.5* 8.7* 8.0* 8.4*  MG  --  1.9  --   --   PHOS  --  3.3  --   --    GFR: Estimated Creatinine Clearance: 73.9 mL/min (A) (by C-G formula based on SCr of 1.3 mg/dL (H)). Liver Function Tests: Recent Labs  Lab 03/25/21 0809 03/28/21 0448  AST 40 33  ALT 33 31  ALKPHOS 59 51  BILITOT 1.5* 0.7  PROT 6.4* 6.0*  ALBUMIN 3.0* 2.8*   No results for input(s): LIPASE, AMYLASE in the last 168 hours. No results for input(s): AMMONIA in the last 168 hours. Coagulation Profile: No results for input(s): INR, PROTIME in the last 168 hours. Cardiac Enzymes: No results for input(s): CKTOTAL, CKMB, CKMBINDEX, TROPONINI in the last 168 hours. BNP (last 3 results) No results for input(s): PROBNP in the last 8760 hours. HbA1C: Recent Labs    03/25/21 1704  HGBA1C 6.1*   CBG: No results for input(s): GLUCAP in the last 168 hours. Lipid Profile: Recent Labs    03/26/21 0919  CHOL 191  HDL 32*  LDLCALC 140*  TRIG 93  CHOLHDL 6.0   Thyroid Function Tests: No results for input(s): TSH, T4TOTAL, FREET4, T3FREE, THYROIDAB in the last 72 hours. Anemia Panel: No results for input(s): VITAMINB12, FOLATE, FERRITIN, TIBC, IRON, RETICCTPCT in the last 72 hours. Sepsis Labs: No results for input(s): PROCALCITON, LATICACIDVEN in the last 168 hours.  Recent Results (from the past 240 hour(s))  Resp Panel by RT-PCR (Flu A&B, Covid) Nasopharyngeal Swab     Status: None   Collection Time: 03/25/21  9:39 AM   Specimen: Nasopharyngeal Swab; Nasopharyngeal(NP) swabs in vial transport medium  Result Value Ref Range Status   SARS Coronavirus 2 by RT PCR NEGATIVE NEGATIVE Final    Comment:  (NOTE) SARS-CoV-2 target nucleic acids are NOT DETECTED.  The SARS-CoV-2 RNA is generally detectable in upper respiratory specimens during the acute phase of infection. The lowest concentration of SARS-CoV-2 viral copies this assay can detect is 138 copies/mL. A negative result does not preclude SARS-Cov-2 infection and should not be used as the sole basis for treatment or other patient management decisions. A negative result may occur with  improper specimen collection/handling, submission of specimen other than nasopharyngeal swab, presence of viral mutation(s) within the areas targeted by this assay, and inadequate number of viral copies(<138 copies/mL). A negative result must be combined with clinical observations, patient history, and epidemiological information. The expected result  is Negative.  Fact Sheet for Patients:  BloggerCourse.com  Fact Sheet for Healthcare Providers:  SeriousBroker.it  This test is no t yet approved or cleared by the [place] FDA and  has been authorized for detection and/or diagnosis of SARS-CoV-2 by FDA under an Emergency Use Authorization (EUA). This EUA will remain  in effect (meaning this test can be used) for the duration of the COVID-19 declaration under Section 564(b)(1) of the Act, 21 U.S.C.section 360bbb-3(b)(1), unless the authorization is terminated  or revoked sooner.       Influenza A by PCR NEGATIVE NEGATIVE Final   Influenza B by PCR NEGATIVE NEGATIVE Final    Comment: (NOTE) The Xpert Xpress SARS-CoV-2/FLU/RSV plus assay is intended as an aid in the diagnosis of influenza from Nasopharyngeal swab specimens and should not be used as a sole basis for treatment. Nasal washings and aspirates are unacceptable for Xpert Xpress SARS-CoV-2/FLU/RSV testing.  Fact Sheet for Patients: BloggerCourse.com  Fact Sheet for Healthcare  Providers: SeriousBroker.it  This test is not yet approved or cleared by the [place] FDA and has been authorized for detection and/or diagnosis of SARS-CoV-2 by FDA under an Emergency Use Authorization (EUA). This EUA will remain in effect (meaning this test can be used) for the duration of the COVID-19 declaration under Section 564(b)(1) of the Act, 21 U.S.C. section 360bbb-3(b)(1), unless the authorization is terminated or revoked.  Performed at [facility], [address], [place], [place] [address]       Radiology Studies: CARDIAC CATHETERIZATION  Result Date: 03/28/2021  Prox RCA lesion is 20% stenosed.  RPAV lesion is 20% stenosed.  Dist RCA lesion is 20% stenosed.  Ost Cx to Mid Cx lesion is 20% stenosed.  Ramus lesion is 30% stenosed.  1st Mrg lesion is 50% stenosed.  1.  Mild nonobstructive coronary artery disease. 2.  Right heart catheterization showed low filling pressures, normal pulmonary pressure and normal cardiac output.  No evidence of significant mitral regurgitation based on waveforms. Recommendations: The patient has nonischemic cardiomyopathy.  Recommend medical therapy. He seems to be mildly volume depleted and mildly hypotensive.  He was given 250 mill bolus of normal saline.  Hold furosemide today.  I also held hydralazine and Imdur for the same reason and elected to add small dose carvedilol 3.125 mg twice daily.  He is already on [place] and spironolactone.      Scheduled Meds: . carvedilol  3.125 mg Oral BID WC  . dapagliflozin propanediol  10 mg Oral Daily  . enoxaparin (LOVENOX) injection  50 mg Subcutaneous Q24H  . montelukast  10 mg Oral QHS  . pantoprazole  40 mg Oral Daily  . rosuvastatin  20 mg Oral Daily  . sacubitril-valsartan  1 tablet Oral BID  . sodium chloride flush  3 mL Intravenous Q12H  . spironolactone  25 mg Oral Daily   Continuous Infusions: . sodium chloride 50 mL/hr at  03/28/21 1145  . sodium chloride       LOS: 3 days      Time spent: 25 minutes   Noralee Stain, DO [facility] 03/28/2021, 3:26 PM   Available via Epic secure chat 7am-7pm After these hours, please refer to coverage provider listed on [email or web address]

## 2021-03-29 ENCOUNTER — Other Ambulatory Visit (HOSPITAL_COMMUNITY): Payer: Self-pay

## 2021-03-29 DIAGNOSIS — N183 Chronic kidney disease, stage 3 unspecified: Secondary | ICD-10-CM

## 2021-03-29 DIAGNOSIS — R7303 Prediabetes: Secondary | ICD-10-CM

## 2021-03-29 DIAGNOSIS — I5021 Acute systolic (congestive) heart failure: Secondary | ICD-10-CM

## 2021-03-29 DIAGNOSIS — I428 Other cardiomyopathies: Secondary | ICD-10-CM

## 2021-03-29 LAB — BASIC METABOLIC PANEL
Anion gap: 10 (ref 5–15)
BUN: 12 mg/dL (ref 6–20)
CO2: 24 mmol/L (ref 22–32)
Calcium: 8.7 mg/dL — ABNORMAL LOW (ref 8.9–10.3)
Chloride: 104 mmol/L (ref 98–111)
Creatinine, Ser: 1.25 mg/dL — ABNORMAL HIGH (ref 0.61–1.24)
GFR, Estimated: >60 mL/min (ref >60)
Glucose, Bld: 90 mg/dL (ref 70–99)
Potassium: 4 mmol/L (ref 3.5–5.1)
Sodium: 138 mmol/L (ref 135–145)

## 2021-03-29 LAB — MAGNESIUM: Magnesium: 2 mg/dL (ref 1.7–2.4)

## 2021-03-29 MED ORDER — CARVEDILOL 6.25 MG PO TABS
6.2500 mg | ORAL_TABLET | Freq: Two times a day (BID) | ORAL | Status: DC
  Administered 2021-03-29: 6.25 mg via ORAL
  Filled 2021-03-29: qty 1

## 2021-03-29 MED ORDER — ROSUVASTATIN CALCIUM 40 MG PO TABS
40.0000 mg | ORAL_TABLET | Freq: Every day | ORAL | 0 refills | Status: DC
  Filled 2021-03-29: qty 30, 30d supply, fill #0

## 2021-03-29 MED ORDER — SACUBITRIL-VALSARTAN 49-51 MG PO TABS
1.0000 | ORAL_TABLET | Freq: Two times a day (BID) | ORAL | 0 refills | Status: DC
  Filled 2021-03-29: qty 60, 30d supply, fill #0

## 2021-03-29 MED ORDER — DAPAGLIFLOZIN PROPANEDIOL 10 MG PO TABS
10.0000 mg | ORAL_TABLET | Freq: Every day | ORAL | 0 refills | Status: DC
  Filled 2021-03-29: qty 30, 30d supply, fill #0

## 2021-03-29 MED ORDER — FUROSEMIDE 20 MG PO TABS
20.0000 mg | ORAL_TABLET | Freq: Every day | ORAL | 0 refills | Status: DC | PRN
  Filled 2021-03-29: qty 30, 30d supply, fill #0

## 2021-03-29 MED ORDER — CARVEDILOL 6.25 MG PO TABS
6.2500 mg | ORAL_TABLET | Freq: Two times a day (BID) | ORAL | 0 refills | Status: DC
  Filled 2021-03-29: qty 60, 30d supply, fill #0

## 2021-03-29 MED ORDER — SPIRONOLACTONE 25 MG PO TABS
25.0000 mg | ORAL_TABLET | Freq: Every day | ORAL | 0 refills | Status: DC
  Filled 2021-03-29: qty 30, 30d supply, fill #0

## 2021-03-29 MED ORDER — ROSUVASTATIN CALCIUM 20 MG PO TABS
40.0000 mg | ORAL_TABLET | Freq: Every day | ORAL | Status: DC
  Administered 2021-03-29: 40 mg via ORAL
  Filled 2021-03-29: qty 2

## 2021-03-29 NOTE — Plan of Care (Signed)

## 2021-03-29 NOTE — Progress Notes (Signed)
Cardiology Progress Note  Patient ID: Andres Roman MRN: [id number] DOB: Apr 26, 1966 Date of Encounter: 03/29/2021  Primary Cardiologist: None  Subjective   Chief Complaint: None.  HPI: Euvolemic on exam.  Nonobstructive CAD on cath yesterday.  Discharge today likely.  ROS:  All other ROS reviewed and negative. Pertinent positives noted in the HPI.     Inpatient Medications  Scheduled Meds: . carvedilol  6.25 mg Oral BID WC  . dapagliflozin propanediol  10 mg Oral Daily  . enoxaparin (LOVENOX) injection  50 mg Subcutaneous Q24H  . montelukast  10 mg Oral QHS  . pantoprazole  40 mg Oral Daily  . rosuvastatin  40 mg Oral Daily  . sacubitril-valsartan  1 tablet Oral BID  . sodium chloride flush  3 mL Intravenous Q12H  . spironolactone  25 mg Oral Daily   Continuous Infusions: . sodium chloride     PRN Meds: sodium chloride, acetaminophen, albuterol, ondansetron (ZOFRAN) IV, sodium chloride flush   Vital Signs   Vitals:   03/28/21 1300 03/28/21 1600 03/28/21 2012 03/29/21 0527  BP: 109/84 107/69 110/84 (!) 114/93  Pulse: 93 94 100 100  Resp:  18 17 20   Temp:  98.1 F (36.7 C) 98.5 F (36.9 C) 98.5 F (36.9 C)  TempSrc:  Oral Oral Oral  SpO2: 96% 98% 99% 98%  Weight:    101.9 kg  Height:        Intake/Output Summary (Last 24 hours) at 03/29/2021 0752 Last data filed at 03/29/2021 0533 Gross per 24 hour  Intake 1203.26 ml  Output 4325 ml  Net -3121.74 ml   Last 3 Weights 03/29/2021 03/28/2021 03/27/2021  Weight (lbs) 224 lb 11.2 oz 230 lb 233 lb 11 oz  Weight (kg) 101.923 kg 104.327 kg 106 kg      Telemetry  Overnight telemetry shows sinus tachycardia in the low 100s, which I personally reviewed.   Physical Exam   Vitals:   03/28/21 1300 03/28/21 1600 03/28/21 2012 03/29/21 0527  BP: 109/84 107/69 110/84 (!) 114/93  Pulse: 93 94 100 100  Resp:  18 17 20   Temp:  98.1 F (36.7 C) 98.5 F (36.9 C) 98.5 F (36.9 C)  TempSrc:  Oral Oral Oral  SpO2: 96% 98%  99% 98%  Weight:    101.9 kg  Height:         Intake/Output Summary (Last 24 hours) at 03/29/2021 0752 Last data filed at 03/29/2021 0533 Gross per 24 hour  Intake 1203.26 ml  Output 4325 ml  Net -3121.74 ml    Last 3 Weights 03/29/2021 03/28/2021 03/27/2021  Weight (lbs) 224 lb 11.2 oz 230 lb 233 lb 11 oz  Weight (kg) 101.923 kg 104.327 kg 106 kg    Body mass index is 35.19 kg/m.  General: Well nourished, well developed, in no acute distress Head: Atraumatic, normal size  Eyes: PEERLA, EOMI  Neck: Supple, no JVD Endocrine: No thryomegaly Cardiac: Normal S1, S2; RRR; 2 out of 6 holosystolic murmur Lungs: Clear to auscultation bilaterally, no wheezing, rhonchi or rales  Abd: Soft, nontender, no hepatomegaly  Ext: No edema, pulses 2+, right radial cath site clean and dry no evidence of hematoma Musculoskeletal: No deformities, BUE and BLE strength normal and equal Skin: Warm and dry, no rashes   Neuro: Alert and oriented to person, place, time, and situation, CNII-XII grossly intact, no focal deficits  Psych: Normal mood and affect   Labs  High Sensitivity Troponin:   Recent Labs  Lab 03/25/21  0809 03/25/21 1006  TROPONINIHS 42* 15     Cardiac EnzymesNo results for input(s): TROPONINI in the last 168 hours. No results for input(s): TROPIPOC in the last 168 hours.  Chemistry Recent Labs  Lab 03/25/21 0809 03/26/21 0919 03/28/21 0448 03/28/21 1030 03/28/21 1032 03/28/21 1836 03/29/21 0256  NA 136   < > 137   < > 141 136 138  K 3.9   < > 3.7   < > 3.7 4.4 4.0  CL 104   < > 104  --   --  104 104  CO2 22   < > 26  --   --  26 24  GLUCOSE 123*   < > 85  --   --  75 90  BUN 10   < > 13  --   --  13 12  CREATININE 1.29*   < > 1.30*  --   --  1.41* 1.25*  CALCIUM 8.5*   < > 8.4*  --   --  8.5* 8.7*  PROT 6.4*  --  6.0*  --   --   --   --   ALBUMIN 3.0*  --  2.8*  --   --   --   --   AST 40  --  33  --   --   --   --   ALT 33  --  31  --   --   --   --   ALKPHOS 59  --   51  --   --   --   --   BILITOT 1.5*  --  0.7  --   --   --   --   GFRNONAA >60   < > >60  --   --  59* >60  ANIONGAP 10   < > 7  --   --  6 10   < > = values in this interval not displayed.    Hematology Recent Labs  Lab 03/25/21 0809 03/26/21 0919 03/28/21 1030 03/28/21 1032  WBC 8.2 8.5  --   --   RBC 5.25 5.44  --   --   HGB 14.2 14.6 15.3 15.3  HCT 43.7 45.9 45.0 45.0  MCV 83.2 84.4  --   --   MCH 27.0 26.8  --   --   MCHC 32.5 31.8  --   --   RDW 15.7* 15.8*  --   --   PLT 335 331  --   --    BNP Recent Labs  Lab 03/25/21 0809  BNP 1,966.6*    DDimer No results for input(s): DDIMER in the last 168 hours.   Radiology  CARDIAC CATHETERIZATION  Result Date: 03/28/2021  Prox RCA lesion is 20% stenosed.  RPAV lesion is 20% stenosed.  Dist RCA lesion is 20% stenosed.  Ost Cx to Mid Cx lesion is 20% stenosed.  Ramus lesion is 30% stenosed.  1st Mrg lesion is 50% stenosed.  1.  Mild nonobstructive coronary artery disease. 2.  Right heart catheterization showed low filling pressures, normal pulmonary pressure and normal cardiac output.  No evidence of significant mitral regurgitation based on waveforms. Recommendations: The patient has nonischemic cardiomyopathy.  Recommend medical therapy. He seems to be mildly volume depleted and mildly hypotensive.  He was given 250 mill bolus of normal saline.  Hold furosemide today.  I also held hydralazine and Imdur for the same reason and elected to add small dose carvedilol 3.125  mg twice daily.  He is already on [place] and spironolactone.   ECHOCARDIOGRAM LIMITED  Result Date: 03/28/2021    ECHOCARDIOGRAM LIMITED REPORT   Patient Name:   Andres Roman Date of Exam: 03/28/2021 Medical Rec #:  [id number]     Height:       67.0 in Accession #:    [id number]    Weight:       230.0 lb Date of Birth:  1966/04/10     BSA:          2.146 m Patient Age:    55 years      BP:           129/106 mmHg Patient Gender: M             HR:            102 bpm. Exam Location:  Inpatient Procedure: Limited Echo and Intracardiac Opacification Agent Indications:    CHF-Acute Systolic  History:        Patient has prior history of Echocardiogram examinations, most                 recent 03/26/2021. CHF; Risk Factors:Diabetes and Hypertension.  Sonographer:    Ross Ludwig RDCS (AE) Referring Phys: [id number] RHONDA G BARRETT IMPRESSIONS  1. Limited study: Left ventricular ejection fraction, by estimation, is 25 to 30%. The left ventricle has severely decreased function. The left ventricle demonstrates global hypokinesis. LV myocardium is thin. No LV thrombus noted. Comparison(s): A prior study was performed on 03/26/21. Prior images reviewed side by side. LV function appears further diminished; though present study done with contrast. FINDINGS  Left Ventricle: Left ventricular ejection fraction, by estimation, is 25 to 30%. The left ventricle has severely decreased function. The left ventricle demonstrates global hypokinesis. Definity contrast agent was given IV to delineate the left ventricular endocardial borders. Riley Lam MD Electronically signed by Riley Lam MD Signature Date/Time: 03/28/2021/3:54:11 PM    Final     Cardiac Studies  TTE 03/28/2021 1. Limited study: Left ventricular ejection fraction, by estimation, is  25 to 30%. The left ventricle has severely decreased function. The left  ventricle demonstrates global hypokinesis. LV myocardium is thin. No LV  thrombus noted.   RHC/LHC 03/28/2021  Prox RCA lesion is 20% stenosed.  RPAV lesion is 20% stenosed.  Dist RCA lesion is 20% stenosed.  Ost Cx to Mid Cx lesion is 20% stenosed.  Ramus lesion is 30% stenosed.  1st Mrg lesion is 50% stenosed.   1.  Mild nonobstructive coronary artery disease. 2.  Right heart catheterization showed low filling pressures, normal pulmonary pressure and normal cardiac output.  No evidence of significant mitral regurgitation based on  waveforms.   Patient Profile  55 year old male with uncontrolled hypertension, poor medication compliance was admitted on 03/25/2021 for new onset systolic heart failure.  Assessment & Plan   1.  New onset systolic heart failure, EF 25-30% -Nonischemic cardiomyopathy.  Left heart cath with nonobstructive CAD.  Suspect this is due to uncontrolled hypertension.  TSH normal.  No drug use reported. -Right heart catheterization showed he is actually dry.  We stopped further diuresis. -He is euvolemic today. -I increased his beta-blocker to Coreg 6.25 mg twice daily.  Continue Entresto 49-51 mg twice daily.  Continue Aldactone 25 mg daily.  Also Farxiga 10 mg daily.  This will be his regimen at discharge. -He is euvolemic.  I counseled him on salt reduction strategies.  I would recommend  he go home on Lasix 20 mg daily as needed.  He was counseled about shortness of breath or weight gain.  Hopefully [name] will help reduce his need for diuretics.  2.  Severe uncontrolled hypertension -Heart failure medications as above.  3.  Nonobstructive CAD -Would recommend he continue his Crestor.  I have increased this to 40 mg.  He should take this at discharge.  I will follow his lipids in the outpatient setting.  I do not feel that strongly about aspirin.  Probably the more important feature is for him to get his cholesterol much lower.  [facility] will sign off.   Medication Recommendations: Heart failure medications as above. Other recommendations (labs, testing, etc): None. Follow up as an outpatient: He has an appointment next week and heart failure follow-up.  He will then see me in 4 to 6 weeks.  For questions or updates, please contact [facility] Please consult [email or web address] for contact info under   Time Spent with Patient: I have spent a total of 25 minutes with patient reviewing hospital notes, telemetry, EKGs, labs and examining the patient as well as establishing an assessment and  plan that was discussed with the patient.  > 50% of time was spent in direct patient care.    Signed, Lenna Gilford. Flora Lipps, MD, [facility] [place]  [facility]  03/29/2021 7:52 AM

## 2021-03-29 NOTE — Progress Notes (Signed)
Heart Failure Stewardship Pharmacist Progress Note   PCP: Gillian Scarce, MD PCP-Cardiologist: None    HPI:  55 yo M with PMH of HTN, HLD, and vitamin D deficiency. He presented to the ED on 03/25/21 with shortness of breath, orthopnea, and BLE edema. A CXR was done on 03/25/21 and showed mild interstitial edema. An ECHO was done on 03/26/21 and LVEF is reduced to 45% with normal RV systolic function. S/p R/LHC on 5/18.  Discharge HF Medications: Furosemide 20 mg daily PRN Carvedilol 6.25 mg BID Entresto 49/51 mg BID Spironolactone 25 mg daily Farxiga 10 mg daily  Prior to admission HF Medications: Losartan 25 mg daily  Pertinent Lab Values: . Serum creatinine 1.25, BUN 12, Potassium 4.0, Sodium 138, BNP 1966.6, Magnesium 2.0  Vital Signs: . Weight: 224 lbs (admission weight: 237 lbs) . Blood pressure: 110/80s  . Heart rate: 90s   Medication Assistance / Insurance Benefits Check: Does the patient have prescription insurance?  No  Does the patient qualify for medication assistance through manufacturers or grants?   Pending . Eligible grants and/or patient assistance programs: pending . Medication assistance applications in progress: none  . Medication assistance applications approved: none Approved medication assistance renewals will be completed by: pending  Outpatient Pharmacy:  Prior to admission [facility] Is the patient willing to use [facility] pharmacy at discharge? Yes Is the patient willing to transition their outpatient pharmacy to utilize a [facility] outpatient pharmacy?   Pending    Assessment/Plan: 1) Acute systolic CHF (EF 47%), due to NICM. R/LHC with mild non-obstructive CAD and low filling pressures. NYHA class II symptoms. - RA 1 on RHC, PCW 3 - continue furosemide PRN at discharge - CO 5.1, CI 3.4 and euvolemic on RHC -  Continue carvedilol 6.25 mg BID at discharge - Continue Entresto 49/51 mg BID - Continue spironolactone 25 mg  daily  - Continue Farxiga 10 mg daily - Hydralazine and Imdur stopped on discharge - can restart if needed at [facility] follow up  2) Patient assistance: - Appears patient is uninsured. Will help him enroll in patient assistance programs for Entresto/Farxiga at HF [facility] appointment on 04/03/21.  - Will consult HF CSW to help initiate long term insurance plan for him  3)  Education  - Patient has been educated on current HF medications and potential additions to HF medication regimen - Patient verbalizes understanding that over the next few months, these medication doses may change and more medications may be added to optimize HF regimen - Patient has been educated on basic disease state pathophysiology and goals of therapy  Sharen Hones, PharmD, BCPS Heart Failure Stewardship Pharmacist Phone [phone]

## 2021-03-29 NOTE — Discharge Summary (Signed)
Physician Discharge Summary  Andres Roman ZOX:[id number] DOB: November 09, 1966 DOA: 03/25/2021  PCP: Gillian Scarce, MD  Admit date: 03/25/2021 Discharge date: 03/29/2021  Admitted From: Home Disposition:  Home  Recommendations for Outpatient Follow-up:  1. Follow up with PCP in 1 week 2. Follow up with cardiology as scheduled next week 5/24   Discharge Condition: Stable CODE STATUS: Full code Diet recommendation: Heart healthy  Brief/Interim Summary: Andres Roman is a 55 year old male history of hypertension, hyperlipidemia, vitamin D deficiency presented to ED with about 1 month of shortness of breath and gradually progressive symptoms. As per patient he has not been taking blood pressure medications.He started developing shortness of breath with exertion, went to urgent care a month ago and was treated with prednisone, albuterol and was given blood pressure medications. Despite taking all his medications he continues to have dyspnea on exertion denies PND.  Echocardiogram obtained yesterday showed EF 45%, hypokinesis of the inferoseptal and inferior walls. Left ventricular function ismildly decreased, left ventricle dilated. Also has biatrial dilation, MR. Patient was admitted for acute systolic heart failure.  Cardiology consulted.  Patient underwent heart cath on 5/18, results as below.  Patient was recommended for medical management.  He diuresed well on Lasix, with improvement in his creatinine as well.  He was discharged home in stable and improved condition with close follow-up scheduled.  Discharge Diagnoses:  Principal Problem:   Acute systolic heart failure (HCC) Active Problems:   Essential hypertension, benign   HLD (hyperlipidemia)   CKD (chronic kidney disease) stage 3, GFR 30-59 ml/min (HCC)   Prediabetes   NICM (nonischemic cardiomyopathy) (HCC)   New onset acute systolic heart failure, NICM -BNP 1966.6 -Status post heart cath 5/18 -Cardiology  following -Continue Aldactone, Entresto, Coreg, Farxiga.  Lasix as needed  Uncontrolled hypertension -Due to medical noncompliance -Continue spironolactone, Entresto, Coreg -Blood pressure improved  Hyperlipidemia -Continue Crestor  CKD stage IIIa -Stable  Prediabetes -Hemoglobin A1c 6.1 -Farxiga   Discharge Instructions  Discharge Instructions    (HEART FAILURE PATIENTS) Call MD:  Anytime you have any of the following symptoms: 1) 3 pound weight gain in 24 hours or 5 pounds in 1 week 2) shortness of breath, with or without a dry hacking cough 3) swelling in the hands, feet or stomach 4) if you have to sleep on extra pillows at night in order to breathe.   Complete by: As directed    Call MD for:  difficulty breathing, headache or visual disturbances   Complete by: As directed    Call MD for:  extreme fatigue   Complete by: As directed    Call MD for:  hives   Complete by: As directed    Call MD for:  persistant dizziness or light-headedness   Complete by: As directed    Call MD for:  persistant nausea and vomiting   Complete by: As directed    Call MD for:  severe uncontrolled pain   Complete by: As directed    Call MD for:  temperature >100.4   Complete by: As directed    Diet - low sodium heart healthy   Complete by: As directed    Discharge instructions   Complete by: As directed    You were cared for by a hospitalist during your hospital stay. If you have any questions about your discharge medications or the care you received while you were in the hospital after you are discharged, you can call the unit and ask to speak with the  hospitalist on call if the hospitalist that took care of you is not available. Once you are discharged, your primary care physician will handle any further medical issues. Please note that NO REFILLS for any discharge medications will be authorized once you are discharged, as it is imperative that you return to your primary care physician  (or establish a relationship with a primary care physician if you do not have one) for your aftercare needs so that they can reassess your need for medications and monitor your lab values.   Increase activity slowly   Complete by: As directed      Allergies as of 03/29/2021   No Known Allergies     Medication List    STOP taking these medications   amLODipine 5 MG tablet Commonly known as: NORVASC   aspirin EC 325 MG tablet   Azelastine-Fluticasone 137-50 MCG/ACT Susp Commonly known as: Dymista   hydrochlorothiazide 25 MG tablet Commonly known as: HYDRODIURIL   losartan 25 MG tablet Commonly known as: COZAAR   mometasone 50 MCG/ACT nasal spray Commonly known as: Nasonex   montelukast 10 MG tablet Commonly known as: SINGULAIR   pantoprazole 20 MG tablet Commonly known as: PROTONIX     TAKE these medications   acetaminophen 325 MG tablet Commonly known as: TYLENOL Take 325-650 mg by mouth every 6 (six) hours as needed (for headaches).   albuterol 108 (90 Base) MCG/ACT inhaler Commonly known as: VENTOLIN HFA Inhale 1-2 puffs into the lungs every 6 (six) hours as needed for wheezing or shortness of breath.   carvedilol 6.25 MG tablet Commonly known as: COREG Take 1 tablet (6.25 mg total) by mouth 2 (two) times daily with a meal.   dapagliflozin propanediol 10 MG Tabs tablet Commonly known as: FARXIGA Take 1 tablet (10 mg total) by mouth daily.   furosemide 20 MG tablet Commonly known as: Lasix Take 1 tablet (20 mg total) by mouth daily as needed for fluid or edema.   IMMUNE SYSTEM BOOSTER PO Take 2 tablets by mouth daily.   omeprazole 40 MG capsule Commonly known as: PRILOSEC Take 40 mg by mouth at bedtime.   rosuvastatin 40 MG tablet Commonly known as: Crestor Take 1 tablet (40 mg total) by mouth daily. What changed:   medication strength  how much to take   sacubitril-valsartan 49-51 MG Commonly known as: ENTRESTO Take 1 tablet by mouth 2 (two)  times daily.   spironolactone 25 MG tablet Commonly known as: ALDACTONE Take 1 tablet (25 mg total) by mouth daily.       Follow-up Information    Zanard, Hinton Dyer, MD. Schedule an appointment as soon as possible for a visit in 1 week(s).   Specialty: Family Medicine Contact information: [address] [place] [address] [phone]              No Known Allergies  Consultations:  Cardiology   Procedures/Studies: DG Chest 2 View  Result Date: 03/25/2021 CLINICAL DATA:  Progressive shortness of breath and edema. EXAM: CHEST - 2 VIEW COMPARISON:  None FINDINGS: The heart is enlarged. There is mild interstitial pulmonary edema. No focal consolidations or pleural effusions. IMPRESSION: Cardiomegaly and mild interstitial edema. Electronically Signed   By: Norva Pavlov M.D.   On: 03/25/2021 08:46   CARDIAC CATHETERIZATION  Result Date: 03/28/2021  Prox RCA lesion is 20% stenosed.  RPAV lesion is 20% stenosed.  Dist RCA lesion is 20% stenosed.  Ost Cx to Mid Cx lesion  is 20% stenosed.  Ramus lesion is 30% stenosed.  1st Mrg lesion is 50% stenosed.  1.  Mild nonobstructive coronary artery disease. 2.  Right heart catheterization showed low filling pressures, normal pulmonary pressure and normal cardiac output.  No evidence of significant mitral regurgitation based on waveforms. Recommendations: The patient has nonischemic cardiomyopathy.  Recommend medical therapy. He seems to be mildly volume depleted and mildly hypotensive.  He was given 250 mill bolus of normal saline.  Hold furosemide today.  I also held hydralazine and Imdur for the same reason and elected to add small dose carvedilol 3.125 mg twice daily.  He is already on [place] and spironolactone.   ECHOCARDIOGRAM COMPLETE  Result Date: 03/26/2021    ECHOCARDIOGRAM REPORT   Patient Name:   Andres Roman Date of Exam: 03/26/2021 Medical Rec #:  [id number]     Height:       67.0 in Accession #:     [id number]    Weight:       231.6 lb Date of Birth:  20-Jan-1966     BSA:          2.152 m Patient Age:    55 years      BP:           131/94 mmHg Patient Gender: M             HR:           100 bpm. Exam Location:  Inpatient Procedure: 2D Echo, Cardiac Doppler and Color Doppler Indications:    CHF  History:        Patient has no prior history of Echocardiogram examinations.                 Risk Factors:Diabetes and Hypertension.  Sonographer:    Neomia Dear RDCS Referring Phys: [id number] KUBER GHIMIRE IMPRESSIONS  1. Endocardium is difficult to visualize. 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 Would recomm limited echo with Definity to confirm. . Left ventricular ejection fraction, by estimation, is 45%. The left ventricle has mildly decreased function. The left ventricular internal cavity size was moderately dilated. Left ventricular diastolic parameters are indeterminate.  2. Right ventricular systolic function is normal. The right ventricular size is normal. There is normal pulmonary artery systolic pressure.  3. Left atrial size was severely dilated.  4. Right atrial size was moderately dilated.  5. MR is eccentric, directed posteriorly to back of left atrium . The mitral valve is normal in structure. Moderate to severe mitral valve regurgitation.  6. The aortic valve is tricuspid. Aortic valve regurgitation is mild. Mild aortic valve sclerosis is present, with no evidence of aortic valve stenosis. FINDINGS  Left Ventricle: Endocardium is difficult to visualize. 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 Would recomm limited echo with Definity to confirm. Left ventricular ejection fraction, by estimation, is 45%. The left ventricle has mildly decreased function. The left ventricular internal cavity size was moderately dilated. There is no left ventricular hypertrophy. Left ventricular diastolic parameters  are indeterminate. Right Ventricle: The right ventricular size is normal. No increase in right ventricular wall thickness. Right ventricular systolic function is normal. There is normal pulmonary artery systolic pressure. The tricuspid regurgitant velocity is 2.84 m/s, and  with an assumed right atrial pressure of 3 mmHg, the estimated right ventricular systolic pressure is 35.3 mmHg. Left Atrium: Left atrial size was severely dilated. Right Atrium: Right atrial  size was moderately dilated. Pericardium: There is no evidence of pericardial effusion. Mitral Valve: MR is eccentric, directed posteriorly to back of left atrium. The mitral valve is normal in structure. Moderate to severe mitral valve regurgitation. Tricuspid Valve: The tricuspid valve is normal in structure. Tricuspid valve regurgitation is mild. Aortic Valve: The aortic valve is tricuspid. Aortic valve regurgitation is mild. Mild aortic valve sclerosis is present, with no evidence of aortic valve stenosis. Aortic valve mean gradient measures 4.0 mmHg. Aortic valve peak gradient measures 6.2 mmHg. Aortic valve area, by VTI measures 2.37 cm. Pulmonic Valve: The pulmonic valve was normal in structure. Pulmonic valve regurgitation is mild. Aorta: The aortic root and ascending aorta are structurally normal, with no evidence of dilitation. IAS/Shunts: No atrial level shunt detected by color flow Doppler.  LEFT VENTRICLE PLAX 2D LVIDd:         5.90 cm      Diastology LVIDs:         5.00 cm      LV e' medial:    8.05 cm/s LV PW:         1.10 cm      LV E/e' medial:  11.4 LV IVS:        0.90 cm      LV e' lateral:   10.40 cm/s LVOT diam:     2.25 cm      LV E/e' lateral: 8.9 LV SV:         47 LV SV Index:   22 LVOT Area:     3.98 cm  LV Volumes (MOD) LV vol d, MOD A2C: 157.0 ml LV vol d, MOD A4C: 138.0 ml LV vol s, MOD A2C: 125.0 ml LV vol s, MOD A4C: 93.0 ml LV SV MOD A2C:     32.0 ml LV SV MOD A4C:     138.0 ml LV SV MOD BP:      39.3 ml RIGHT VENTRICLE RV S  prime:     14.80 cm/s TAPSE (M-mode): 1.8 cm LEFT ATRIUM              Index       RIGHT ATRIUM           Index LA diam:        5.20 cm  2.42 cm/m  RA Area:     26.10 cm LA Vol (A2C):   95.1 ml  44.18 ml/m RA Volume:   97.30 ml  45.21 ml/m LA Vol (A4C):   149.0 ml 69.23 ml/m LA Biplane Vol: 121.0 ml 56.22 ml/m  AORTIC VALVE AV Area (Vmax):    2.61 cm AV Area (Vmean):   2.36 cm AV Area (VTI):     2.37 cm AV Vmax:           125.00 cm/s AV Vmean:          91.200 cm/s AV VTI:            0.198 m AV Peak Grad:      6.2 mmHg AV Mean Grad:      4.0 mmHg LVOT Vmax:         82.20 cm/s LVOT Vmean:        54.200 cm/s LVOT VTI:          0.118 m LVOT/AV VTI ratio: 0.60  AORTA Ao Root diam: 3.30 cm Ao Asc diam:  3.00 cm MR Peak grad:    114.9 mmHg  TRICUSPID VALVE MR Mean grad:  74.0 mmHg   TR Peak grad:   32.3 mmHg MR Vmax:         536.00 cm/s TR Vmax:        284.00 cm/s MR Vmean:        399.0 cm/s MR PISA:         0.57 cm    SHUNTS MR PISA Eff ROA: 3 mm       Systemic VTI:  0.12 m MR PISA Radius:  0.30 cm     Systemic Diam: 2.25 cm MV E velocity: 92.10 cm/s Dietrich Pates MD Electronically signed by Dietrich Pates MD Signature Date/Time: 03/26/2021/7:18:22 PM    Final    ECHOCARDIOGRAM LIMITED  Result Date: 03/28/2021    ECHOCARDIOGRAM LIMITED REPORT   Patient Name:   Andres Roman Date of Exam: 03/28/2021 Medical Rec #:  [id number]     Height:       67.0 in Accession #:    [id number]    Weight:       230.0 lb Date of Birth:  01/06/1966     BSA:          2.146 m Patient Age:    55 years      BP:           129/106 mmHg Patient Gender: M             HR:           102 bpm. Exam Location:  Inpatient Procedure: Limited Echo and Intracardiac Opacification Agent Indications:    CHF-Acute Systolic  History:        Patient has prior history of Echocardiogram examinations, most                 recent 03/26/2021. CHF; Risk Factors:Diabetes and Hypertension.  Sonographer:    Ross Ludwig RDCS (AE) Referring Phys: [id number] RHONDA G BARRETT  IMPRESSIONS  1. Limited study: Left ventricular ejection fraction, by estimation, is 25 to 30%. The left ventricle has severely decreased function. The left ventricle demonstrates global hypokinesis. LV myocardium is thin. No LV thrombus noted. Comparison(s): A prior study was performed on 03/26/21. Prior images reviewed side by side. LV function appears further diminished; though present study done with contrast. FINDINGS  Left Ventricle: Left ventricular ejection fraction, by estimation, is 25 to 30%. The left ventricle has severely decreased function. The left ventricle demonstrates global hypokinesis. Definity contrast agent was given IV to delineate the left ventricular endocardial borders. Riley Lam MD Electronically signed by Riley Lam MD Signature Date/Time: 03/28/2021/3:54:11 PM    Final        Discharge Exam: Vitals:   03/28/21 2012 03/29/21 0527  BP: 110/84 (!) 114/93  Pulse: 100 100  Resp: 17 20  Temp: 98.5 F (36.9 C) 98.5 F (36.9 C)  SpO2: 99% 98%    General: Pt is alert, awake, not in acute distress Cardiovascular: RRR, S1/S2 +, no edema Respiratory: CTA bilaterally, no wheezing, no rhonchi, no respiratory distress, no conversational dyspnea, on room air Abdominal: Soft, NT, ND, bowel sounds + Extremities: no edema, no cyanosis Psych: Normal mood and affect, stable judgement and insight     The results of significant diagnostics from this hospitalization (including imaging, microbiology, ancillary and laboratory) are listed below for reference.     Microbiology: Recent Results (from the past 240 hour(s))  Resp Panel by RT-PCR (Flu A&B, Covid) Nasopharyngeal Swab     Status: None   Collection Time: 03/25/21  9:39 AM  Specimen: Nasopharyngeal Swab; Nasopharyngeal(NP) swabs in vial transport medium  Result Value Ref Range Status   SARS Coronavirus 2 by RT PCR NEGATIVE NEGATIVE Final    Comment: (NOTE) SARS-CoV-2 target nucleic acids are NOT  DETECTED.  The SARS-CoV-2 RNA is generally detectable in upper respiratory specimens during the acute phase of infection. The lowest concentration of SARS-CoV-2 viral copies this assay can detect is 138 copies/mL. A negative result does not preclude SARS-Cov-2 infection and should not be used as the sole basis for treatment or other patient management decisions. A negative result may occur with  improper specimen collection/handling, submission of specimen other than nasopharyngeal swab, presence of viral mutation(s) within the areas targeted by this assay, and inadequate number of viral copies(<138 copies/mL). A negative result must be combined with clinical observations, patient history, and epidemiological information. The expected result is Negative.  Fact Sheet for Patients:  BloggerCourse.com  Fact Sheet for Healthcare Providers:  SeriousBroker.it  This test is no t yet approved or cleared by the [place] FDA and  has been authorized for detection and/or diagnosis of SARS-CoV-2 by FDA under an Emergency Use Authorization (EUA). This EUA will remain  in effect (meaning this test can be used) for the duration of the COVID-19 declaration under Section 564(b)(1) of the Act, 21 U.S.C.section 360bbb-3(b)(1), unless the authorization is terminated  or revoked sooner.       Influenza A by PCR NEGATIVE NEGATIVE Final   Influenza B by PCR NEGATIVE NEGATIVE Final    Comment: (NOTE) The Xpert Xpress SARS-CoV-2/FLU/RSV plus assay is intended as an aid in the diagnosis of influenza from Nasopharyngeal swab specimens and should not be used as a sole basis for treatment. Nasal washings and aspirates are unacceptable for Xpert Xpress SARS-CoV-2/FLU/RSV testing.  Fact Sheet for Patients: BloggerCourse.com  Fact Sheet for Healthcare Providers: SeriousBroker.it  This test is not yet  approved or cleared by the [place] FDA and has been authorized for detection and/or diagnosis of SARS-CoV-2 by FDA under an Emergency Use Authorization (EUA). This EUA will remain in effect (meaning this test can be used) for the duration of the COVID-19 declaration under Section 564(b)(1) of the Act, 21 U.S.C. section 360bbb-3(b)(1), unless the authorization is terminated or revoked.  Performed at [facility], [address] [place], [place] [address]      Labs: BNP (last 3 results) Recent Labs    03/25/21 0809  BNP 1,966.6*   Basic Metabolic Panel: Recent Labs  Lab 03/26/21 0919 03/27/21 0351 03/28/21 0448 03/28/21 1030 03/28/21 1032 03/28/21 1836 03/29/21 0256  NA 140 139 137 142 141 136 138  K 3.4* 3.7 3.7 3.7 3.7 4.4 4.0  CL 104 106 104  --   --  104 104  CO2 25 26 26   --   --  26 24  GLUCOSE 126* 105* 85  --   --  75 90  BUN 11 17 13   --   --  13 12  CREATININE 1.48* 1.46* 1.30*  --   --  1.41* 1.25*  CALCIUM 8.7* 8.0* 8.4*  --   --  8.5* 8.7*  MG 1.9  --   --   --   --  2.1 2.0  PHOS 3.3  --   --   --   --   --   --    Liver Function Tests: Recent Labs  Lab 03/25/21 0809 03/28/21 0448  AST 40 33  ALT 33 31  ALKPHOS 59 51  BILITOT 1.5* 0.7  PROT 6.4* 6.0*  ALBUMIN 3.0* 2.8*   No results for input(s): LIPASE, AMYLASE in the last 168 hours. No results for input(s): AMMONIA in the last 168 hours. CBC: Recent Labs  Lab 03/25/21 0809 03/26/21 0919 03/28/21 1030 03/28/21 1032  WBC 8.2 8.5  --   --   NEUTROABS 3.2  --   --   --   HGB 14.2 14.6 15.3 15.3  HCT 43.7 45.9 45.0 45.0  MCV 83.2 84.4  --   --   PLT 335 331  --   --    Cardiac Enzymes: No results for input(s): CKTOTAL, CKMB, CKMBINDEX, TROPONINI in the last 168 hours. BNP: Invalid input(s): POCBNP CBG: No results for input(s): GLUCAP in the last 168 hours. D-Dimer No results for input(s): DDIMER in the last 72 hours. Hgb A1c No results for input(s): HGBA1C in the  last 72 hours. Lipid Profile Recent Labs    03/26/21 0919  CHOL 191  HDL 32*  LDLCALC 140*  TRIG 93  CHOLHDL 6.0   Thyroid function studies No results for input(s): TSH, T4TOTAL, T3FREE, THYROIDAB in the last 72 hours.  Invalid input(s): FREET3 Anemia work up No results for input(s): VITAMINB12, FOLATE, FERRITIN, TIBC, IRON, RETICCTPCT in the last 72 hours. Urinalysis No results found for: COLORURINE, APPEARANCEUR, LABSPEC, PHURINE, GLUCOSEU, HGBUR, BILIRUBINUR, KETONESUR, PROTEINUR, UROBILINOGEN, NITRITE, LEUKOCYTESUR Sepsis Labs Invalid input(s): PROCALCITONIN,  WBC,  LACTICIDVEN Microbiology Recent Results (from the past 240 hour(s))  Resp Panel by RT-PCR (Flu A&B, Covid) Nasopharyngeal Swab     Status: None   Collection Time: 03/25/21  9:39 AM   Specimen: Nasopharyngeal Swab; Nasopharyngeal(NP) swabs in vial transport medium  Result Value Ref Range Status   SARS Coronavirus 2 by RT PCR NEGATIVE NEGATIVE Final    Comment: (NOTE) SARS-CoV-2 target nucleic acids are NOT DETECTED.  The SARS-CoV-2 RNA is generally detectable in upper respiratory specimens during the acute phase of infection. The lowest concentration of SARS-CoV-2 viral copies this assay can detect is 138 copies/mL. A negative result does not preclude SARS-Cov-2 infection and should not be used as the sole basis for treatment or other patient management decisions. A negative result may occur with  improper specimen collection/handling, submission of specimen other than nasopharyngeal swab, presence of viral mutation(s) within the areas targeted by this assay, and inadequate number of viral copies(<138 copies/mL). A negative result must be combined with clinical observations, patient history, and epidemiological information. The expected result is Negative.  Fact Sheet for Patients:  BloggerCourse.com  Fact Sheet for Healthcare Providers:   SeriousBroker.it  This test is no t yet approved or cleared by the [place] FDA and  has been authorized for detection and/or diagnosis of SARS-CoV-2 by FDA under an Emergency Use Authorization (EUA). This EUA will remain  in effect (meaning this test can be used) for the duration of the COVID-19 declaration under Section 564(b)(1) of the Act, 21 U.S.C.section 360bbb-3(b)(1), unless the authorization is terminated  or revoked sooner.       Influenza A by PCR NEGATIVE NEGATIVE Final   Influenza B by PCR NEGATIVE NEGATIVE Final    Comment: (NOTE) The Xpert Xpress SARS-CoV-2/FLU/RSV plus assay is intended as an aid in the diagnosis of influenza from Nasopharyngeal swab specimens and should not be used as a sole basis for treatment. Nasal washings and aspirates are unacceptable for Xpert Xpress SARS-CoV-2/FLU/RSV testing.  Fact Sheet for Patients: BloggerCourse.com  Fact Sheet for Healthcare  Providers: SeriousBroker.it  This test is not yet approved or cleared by the [place] and has been authorized for detection and/or diagnosis of SARS-CoV-2 by FDA under an Emergency Use Authorization (EUA). This EUA will remain in effect (meaning this test can be used) for the duration of the COVID-19 declaration under Section 564(b)(1) of the Act, 21 U.S.C. section 360bbb-3(b)(1), unless the authorization is terminated or revoked.  Performed at [facility], [address] [place], [place] [address]      Patient was seen and examined on the day of discharge and was found to be in stable condition. Time coordinating discharge: 25 minutes including assessment and coordination of care, as well as examination of the patient.   SIGNED:  Noralee Stain, DO [facility] 03/29/2021, 8:48 AM

## 2021-03-29 NOTE — Progress Notes (Signed)
D/C instructions given and reviewed. Tele and IV removed, tolerated well. Awaiting TOC med delivery and requested return to work note. MD notified via secure chat.

## 2021-03-29 NOTE — Plan of Care (Signed)

## 2021-04-02 NOTE — Progress Notes (Addendum)
[facility]  PCP: Birdena Jubilee Primary Cardiologist: Lennie Odor  HPI:  Andres Roman is a 55 y.o.  male  with a PMH significant for hypertension, hyperlipidemia, vitamin D deficiency, newly diagnosed systolic CHF    No prior cardiac history, presented to [facility] ED on 03/25/21 with about 1 month of shortness of breath and gradually progressive symptoms.  Pt reported not taking bp meds for some time and did not follow up with PCP. Went to urgent care a month prior with dyspnea and was treated with prednisone, albuterol and blood pressure medications were given.  On arrival to ED bp was 157/127, HR 114.   Chest x-ray with bilateral vascular congestion.  Troponin mildly elevated.  BNP more than 1900.  Serum creatinine 1.29, last known 0.89 that was 7 years ago.  Started on IV diuretics.  Had an ECHO which demonstrated EF 30%, normal LV thickness, moderately dilated LV, moderate to severe MR, mild TR, mild AI, normal RV function.  He underwent LHC which demonstrated mild non obstructive multivessel CAD, preserved cardiac output, low filling pressures, normal pulmonary pressures, no significant MR.  IV diuretics held, transitioned to GDMT Coreg 6.25 mg twice daily, Entresto 49-51 mg twice daily, Aldactone 25 mg daily, Farxiga 10 mg daily, lasix 20mg  PRN.  Discharge weight 225lbs from 247.    Here for hospital FU.  Has been doing well.  No chest pain, dyspnea, took one dose of as needed lasix by mistake day after discharge forgot it was PRN but has not taken any since.  Weight at home has been stable around 227 but admits weighs himself at different times of day with and without clothes.  Reports significantly improved exercise capacity.      ROS: All systems negative except as listed in HPI, PMH and Problem List.  SH:  Social History   Socioeconomic History  . Marital status: Single    Spouse name: Not on file  . Number of children: 4  . Years of  education: 31  . Highest education level: High school graduate  Occupational History  . 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 Smoking status: Never Smoker  . Smokeless tobacco: Never Used  Vaping Use  . Vaping Use: Never used  Substance and Sexual Activity  . Alcohol use: No  . Drug use: No  . Sexual activity: Yes    Partners: Female    Birth control/protection: None  Other Topics Concern  . Not on file  Social History Narrative   Marital Status:  Separated Hospital doctor)    Children:  Daughters (3) Step Son (1)    Pets: None    Living Situation: Lives alone   Occupation:  Marketing executive (Naval architect); Part-Time Occupational psychologist)   Education:  Health and safety inspector    Tobacco Use/Exposure:  None    Alcohol Use:  None    Drug Use:  None   Diet:  Regular   Exercise:  He does not do formal exercise but his jobs are very physical.     Hobbies:  Basketball                 Social Determinants of Health   Financial Resource Strain: High Risk  . Difficulty of Paying Living Expenses: Hard  Food Insecurity: No Food Insecurity  . Worried About Environmental consultant in the Last Year: Never true  .  Ran Out of Food in the Last Year: Never true  Transportation Needs: No Transportation Needs  . Lack of Transportation (Medical): No  . Lack of Transportation (Non-Medical): No  Physical Activity: Not on file  Stress: Not on file  Social Connections: Not on file  Intimate Partner Violence: Not on file    FH:  Family History  Problem Relation Age of Onset  . Diabetes Mother   . Hypertension Mother   . Stroke Mother   . Hypertension Brother   . Diabetes Brother   . Heart disease Paternal Grandmother     Past Medical History:  Diagnosis Date  . Acute systolic CHF (congestive heart failure) (HCC) 03/26/2021  . Hyperlipidemia   . Hypertension   . Obese     Current Outpatient Medications  Medication Sig Dispense Refill  .  acetaminophen (TYLENOL) 325 MG tablet Take 325-650 mg by mouth every 6 (six) hours as needed (for headaches).    . carvedilol (COREG) 6.25 MG tablet Take 1 tablet (6.25 mg total) by mouth 2 (two) times daily with a meal. 60 tablet 0  . dapagliflozin propanediol (FARXIGA) 10 MG TABS tablet Take 1 tablet (10 mg total) by mouth daily. 30 tablet 0  . furosemide (LASIX) 20 MG tablet Take 1 tablet (20 mg total) by mouth daily as needed for fluid or edema. 30 tablet 0  . rosuvastatin (CRESTOR) 40 MG tablet Take 1 tablet (40 mg total) by mouth daily. 30 tablet 0  . sacubitril-valsartan (ENTRESTO) 49-51 MG Take 1 tablet by mouth 2 (two) times daily. 60 tablet 0  . spironolactone (ALDACTONE) 25 MG tablet Take 1 tablet (25 mg total) by mouth daily. 30 tablet 0   No current facility-administered medications for this encounter.    Vitals:   04/03/21 0902  BP: 98/66  Pulse: 90  SpO2: 97%  Weight: 103.5 kg (228 lb 4 oz)    PHYSICAL EXAM: Cardiac: JVD flat, normal rate and rhythm, clear s1 and s2, no murmurs, rubs or gallops, no LE edema Pulmonary: CTAB, not in distress Abdominal: non distended abdomen, soft and nontender Psych: Alert, conversant, in good spirits   ECG   NSR rate 86 non specific T wave changes, borderline prolonged QT  ASSESSMENT & PLAN: Chronic Systolic CHF: -new diagnosis, NICM hx poorly controlled HTN otherwise uncertain etiology, notably LV thickness normal on echo  -03/28/21 ECHO with EF 30%, normal LV thickness, moderate to severe MR (although appears more mild-mod), mild TR, mild AI, normal RV function -03/29/21 LHC LHC which demonstrated mild non obstructive multivessel CAD, preserved cardiac output, low filling pressures, normal pulmonary pressures, no significant MR -Currently taking Coreg 6.25 mg twice daily, Entresto 49-51 mg twice daily, Aldactone 25 mg daily, Farxiga 10 mg daily, lasix 20mg  PRN -NYHA Class II symptoms, euvolemic on exam REDS 38% -bp on the lower  side, he is asymptomatic, will not add further GDMT today keep entresto at current dose, recheck renal function today make sure kidneys tolerating this okay.  May be able to add ivabradine at some point in the future.    CKD IIIa: -cr on discharge 1.25 -repeat bmp today  HTN: -well controlled on meds above  Obesity: -has improved his diet, encouraged weight loss   Follow up with [facility]

## 2021-04-03 ENCOUNTER — Other Ambulatory Visit: Payer: Self-pay

## 2021-04-03 ENCOUNTER — Encounter (HOSPITAL_COMMUNITY): Payer: Self-pay

## 2021-04-03 ENCOUNTER — Ambulatory Visit (HOSPITAL_COMMUNITY)
Admit: 2021-04-03 | Discharge: 2021-04-03 | Disposition: A | Discharge status: Home / Self Care | Payer: Self-pay | Attending: Internal Medicine | Admitting: Internal Medicine

## 2021-04-03 VITALS — BP 98/66 | HR 90 | Wt 228.2 lb

## 2021-04-03 DIAGNOSIS — N1831 Chronic kidney disease, stage 3a: Secondary | ICD-10-CM

## 2021-04-03 DIAGNOSIS — E785 Hyperlipidemia, unspecified: Secondary | ICD-10-CM | POA: Insufficient documentation

## 2021-04-03 DIAGNOSIS — Z8249 Family history of ischemic heart disease and other diseases of the circulatory system: Secondary | ICD-10-CM | POA: Insufficient documentation

## 2021-04-03 DIAGNOSIS — I428 Other cardiomyopathies: Secondary | ICD-10-CM

## 2021-04-03 DIAGNOSIS — I130 Hypertensive heart and chronic kidney disease with heart failure and stage 1 through stage 4 chronic kidney disease, or unspecified chronic kidney disease: Secondary | ICD-10-CM | POA: Insufficient documentation

## 2021-04-03 DIAGNOSIS — E669 Obesity, unspecified: Secondary | ICD-10-CM | POA: Insufficient documentation

## 2021-04-03 DIAGNOSIS — E559 Vitamin D deficiency, unspecified: Secondary | ICD-10-CM | POA: Insufficient documentation

## 2021-04-03 DIAGNOSIS — Z6835 Body mass index (BMI) 35.0-35.9, adult: Secondary | ICD-10-CM | POA: Insufficient documentation

## 2021-04-03 DIAGNOSIS — Z79899 Other long term (current) drug therapy: Secondary | ICD-10-CM | POA: Insufficient documentation

## 2021-04-03 DIAGNOSIS — I2510 Atherosclerotic heart disease of native coronary artery without angina pectoris: Secondary | ICD-10-CM | POA: Insufficient documentation

## 2021-04-03 DIAGNOSIS — Z7984 Long term (current) use of oral hypoglycemic drugs: Secondary | ICD-10-CM | POA: Insufficient documentation

## 2021-04-03 DIAGNOSIS — I5022 Chronic systolic (congestive) heart failure: Secondary | ICD-10-CM

## 2021-04-03 LAB — BASIC METABOLIC PANEL
Anion gap: 8 (ref 5–15)
BUN: 29 mg/dL — ABNORMAL HIGH (ref 6–20)
CO2: 22 mmol/L (ref 22–32)
Calcium: 9.1 mg/dL (ref 8.9–10.3)
Chloride: 104 mmol/L (ref 98–111)
Creatinine, Ser: 1.58 mg/dL — ABNORMAL HIGH (ref 0.61–1.24)
GFR, Estimated: 51 mL/min — ABNORMAL LOW (ref >60)
Glucose, Bld: 94 mg/dL (ref 70–99)
Potassium: 4.7 mmol/L (ref 3.5–5.1)
Sodium: 134 mmol/L — ABNORMAL LOW (ref 135–145)

## 2021-04-03 NOTE — Progress Notes (Signed)
ReDS Vest / Clip - 04/03/21 0902      ReDS Vest / Clip   Station Marker D    Ruler Value 36    ReDS Value Range Moderate volume overload    ReDS Actual Value 38

## 2021-04-03 NOTE — Progress Notes (Signed)
Provided education on weighing daily, same time with same amount of clothes on. We also discussed low salt foods and reading food labels. Pt is very interested in doing what he needs to stay successful at home and out of the hospital. Will have CSW see to discuss lack of insurance and financial concerns.

## 2021-04-03 NOTE — Progress Notes (Signed)
Heart and Vascular Care Navigation  04/03/2021  Andres Roman [birth year] [id number]  Reason for Referral: Patient seen in [facility] clinic.   Engaged with patient face to face for initial visit for Heart and Vascular Care Coordination.                                                                                                   Assessment:  Patient is a 55yo male who currently lives alone in an apartment but planning to move to his sister's home due to financial strain. Patient reports he has been working on and off part time and struggling to make ends meet financially. He states that with recent illness and hospitalization he has not been able to work.  He reports he has very supportive daughters who assist with transport needs as well as financial assistance for medications.  He receives $249 in food stamps and denies any food insecurity at this time. He was referred to [facility] for possible medicaid application and states he has not heard anything back regarding an pending application. Patient denies any drug or alcohol concerns at this time. He has a PCP appointment next week at [facility] and informed of pharmacy options at the clinic.                              HRT/VAS Care Coordination     Patients Home Cardiology Office --  [facility]   Outpatient Care Team Social Worker   Social Worker Name: Lasandra Beech, [place] [phone]   Living arrangements for the past 2 months Apartment  Patient moving from his own apartment to his sisters due to financial strain   Lives with: Relatives  Sister   Patient Current Insurance Coverage Self-Pay   Patient Has Concern With Paying Medical Bills Yes   Patient Concerns With Medical Bills self pay   Medical Bill Referrals: Referred to [facility] for medicaid   Does Patient Have Prescription Coverage? No   Home Assistive Devices/Equipment None       Social History:                                                                             SDOH  Screenings   Alcohol Screen: Low Risk   . Last Alcohol Screening Score (AUDIT): 0  Depression (PHQ2-9): Not on file  Financial Resource Strain: High Risk  . Difficulty of Paying Living Expenses: Hard  Food Insecurity: No Food Insecurity  . Worried About Programme researcher, broadcasting/film/video in the Last Year: Never true  . Ran Out of Food in the Last Year: Never true  Housing: High Risk  . Last Housing Risk Score: 2  Physical Activity: Not on file  Social Connections: Not on file  Stress:  Not on file  Tobacco Use: Low Risk   . Smoking Tobacco Use: Never Smoker  . Smokeless Tobacco Use: Never Used  Transportation Needs: No Transportation Needs  . Lack of Transportation (Medical): No  . Lack of Transportation (Non-Medical): No    SDOH Interventions: Financial Resources:   [facility] reviewing for [employer] Insecurity:  Patient has food stamps  Housing Insecurity:  Housing Interventions: Other (Comment) (Patient moving with his sister)  Transportation:   Patient's family assists when needed   Follow-up plan:  CSW will follow up with [facility] to inquire about status of medicaid application and return call to patient. Patient verbalizes understanding of follow up and will call if needed. Lasandra Beech, LCSW, CCSW-MCS [phone]

## 2021-04-03 NOTE — Progress Notes (Signed)
[facility] Transitions of Care Clinic Heart Failure Pharmacist Encounter  Completed application for [employer] Patient Assistance Program sent in an effort to reduce the patient's out of pocket expense for Entresto to $0.    Application completed and faxed to [phone].   [employer] patient assistance phone number for follow up is [phone].    [place] sent in an effort to reduce the patient's out of pocket expense for Farxiga to $0.    Application completed and faxed to [phone]   [place] patient [place] phone number for follow up is [phone].      Sharen Hones, PharmD, BCPS Heart Failure Transitions of Care Clinic Pharmacist [phone]

## 2021-04-03 NOTE — Patient Instructions (Addendum)
Thank you for allowing [place] to provider your heart failure care after your recent hospitalization. Please follow-up with Dr Flora Lipps as scheduled on 05/07/21  If you have any questions, issues, or concerns before your next appointment please call our office at [phone], opt. 2 and leave a message for the triage nurse.  Do the following things EVERYDAY: 1) Weigh yourself in the morning before breakfast. Write it down and keep it in a log. 2) Take your medicines as prescribed 3) Eat low salt foods--Limit salt (sodium) to 2000 mg per day.  4) Stay as active as you can everyday 5) Limit all fluids for the day to less than 2 liters  Weigh yourself EVERY morning after you go to the bathroom but before you eat or drink anything. Write this number down in a weight log/diary. If you gain 3 pounds overnight or 5 pounds in a week, call the heart failure clinic  Restrict your sodium intake to less than 2000mg  per day. This will help prevent your body from holding onto fluid. Read food labels as a lot of canned and packaged foods have a lot of sodium.  Weigh yourself EVERY morning after you go to the bathroom but before you eat or drink anything. Write this number down in a weight log/diary. If you gain 3 pounds overnight or 5 pounds in a week, call the heart failure clinic

## 2021-04-04 ENCOUNTER — Telehealth (HOSPITAL_COMMUNITY): Payer: Self-pay | Admitting: Licensed Clinical Social Worker

## 2021-04-04 NOTE — Telephone Encounter (Signed)
CSW contacted Enrique Sack from [facility] to follow up on referral for medicaid evaluation. CSW informed that patient does not meet criteria for medicaid eligibility. [facility]ist will refer patient back to the Financial Counseling office for follow up on a CAFA application. CSW contacted patient and left message for return call and also mailed a CAFA application to patient. CSW awaiting return call from patient to discuss further his options. Lasandra Beech, LCSW, CCSW-MCS [phone]

## 2021-04-04 NOTE — Telephone Encounter (Signed)
CSW received return call from patient. CSW discussed [facility] and ineligibility for medicaid based on conversation with medicaid. CSW informed patient of CAFA program and mailed application for patient to complete. Patient will return call to CSW as needed for further assistance with application. Lasandra Beech, LCSW, CCSW-MCS [phone]

## 2021-04-05 ENCOUNTER — Telehealth: Payer: Self-pay

## 2021-04-05 NOTE — Telephone Encounter (Signed)
Received notification that patient was approved by [employer] Patient Assistance for Entresto until 04/04/2022.  Will scan into chart.

## 2021-04-06 ENCOUNTER — Telehealth (HOSPITAL_COMMUNITY): Payer: Self-pay

## 2021-04-06 NOTE — Telephone Encounter (Signed)
Contacted [place] to follow up on patient assistance application for [place]. He has been approved from 04/06/21-04/05/22. [place] phone number for questions [phone].  Contacted [employer] to follow up on patient assistance application for [employer]. He has been approved from 04/04/21-04/04/22. They have reached out to the patient to send initial delivery but have not heard back from the patient. [employer] phone number to request fill [phone] option 4.   Called pt to inform him of approvals. Instructed him to call [employer] to request shipment of Entresto. [name] should be shipped early next week.   All questions answered. Patient verbalized understanding.

## 2021-04-08 ENCOUNTER — Other Ambulatory Visit: Payer: Self-pay

## 2021-04-08 ENCOUNTER — Inpatient Hospital Stay (HOSPITAL_BASED_OUTPATIENT_CLINIC_OR_DEPARTMENT_OTHER)
Admission: EM | Admit: 2021-04-08 | Discharge: 2021-04-14 | DRG: 175 | Disposition: A | Discharge status: Home / Self Care | Payer: Self-pay | Attending: Internal Medicine | Admitting: Internal Medicine

## 2021-04-08 ENCOUNTER — Emergency Department (HOSPITAL_BASED_OUTPATIENT_CLINIC_OR_DEPARTMENT_OTHER): Payer: Self-pay

## 2021-04-08 ENCOUNTER — Encounter (HOSPITAL_BASED_OUTPATIENT_CLINIC_OR_DEPARTMENT_OTHER): Payer: Self-pay | Admitting: Emergency Medicine

## 2021-04-08 DIAGNOSIS — Z20822 Contact with and (suspected) exposure to covid-19: Secondary | ICD-10-CM | POA: Diagnosis present

## 2021-04-08 DIAGNOSIS — R1314 Dysphagia, pharyngoesophageal phase: Secondary | ICD-10-CM | POA: Diagnosis present

## 2021-04-08 DIAGNOSIS — Z79899 Other long term (current) drug therapy: Secondary | ICD-10-CM

## 2021-04-08 DIAGNOSIS — E871 Hypo-osmolality and hyponatremia: Secondary | ICD-10-CM | POA: Diagnosis present

## 2021-04-08 DIAGNOSIS — I130 Hypertensive heart and chronic kidney disease with heart failure and stage 1 through stage 4 chronic kidney disease, or unspecified chronic kidney disease: Secondary | ICD-10-CM | POA: Diagnosis present

## 2021-04-08 DIAGNOSIS — I700 Atherosclerosis of aorta: Secondary | ICD-10-CM | POA: Diagnosis present

## 2021-04-08 DIAGNOSIS — Z833 Family history of diabetes mellitus: Secondary | ICD-10-CM

## 2021-04-08 DIAGNOSIS — J189 Pneumonia, unspecified organism: Secondary | ICD-10-CM | POA: Diagnosis not present

## 2021-04-08 DIAGNOSIS — N1831 Chronic kidney disease, stage 3a: Secondary | ICD-10-CM | POA: Diagnosis present

## 2021-04-08 DIAGNOSIS — Z8249 Family history of ischemic heart disease and other diseases of the circulatory system: Secondary | ICD-10-CM

## 2021-04-08 DIAGNOSIS — I2693 Single subsegmental pulmonary embolism without acute cor pulmonale: Principal | ICD-10-CM | POA: Diagnosis present

## 2021-04-08 DIAGNOSIS — R509 Fever, unspecified: Secondary | ICD-10-CM

## 2021-04-08 DIAGNOSIS — R042 Hemoptysis: Secondary | ICD-10-CM

## 2021-04-08 DIAGNOSIS — E785 Hyperlipidemia, unspecified: Secondary | ICD-10-CM | POA: Diagnosis present

## 2021-04-08 DIAGNOSIS — E1122 Type 2 diabetes mellitus with diabetic chronic kidney disease: Secondary | ICD-10-CM | POA: Diagnosis present

## 2021-04-08 DIAGNOSIS — Z6834 Body mass index (BMI) 34.0-34.9, adult: Secondary | ICD-10-CM

## 2021-04-08 DIAGNOSIS — N183 Chronic kidney disease, stage 3 unspecified: Secondary | ICD-10-CM | POA: Diagnosis present

## 2021-04-08 DIAGNOSIS — I5022 Chronic systolic (congestive) heart failure: Secondary | ICD-10-CM

## 2021-04-08 DIAGNOSIS — Z823 Family history of stroke: Secondary | ICD-10-CM

## 2021-04-08 DIAGNOSIS — I10 Essential (primary) hypertension: Secondary | ICD-10-CM | POA: Diagnosis present

## 2021-04-08 DIAGNOSIS — E669 Obesity, unspecified: Secondary | ICD-10-CM | POA: Diagnosis present

## 2021-04-08 DIAGNOSIS — I2510 Atherosclerotic heart disease of native coronary artery without angina pectoris: Secondary | ICD-10-CM | POA: Diagnosis present

## 2021-04-08 DIAGNOSIS — I428 Other cardiomyopathies: Secondary | ICD-10-CM | POA: Diagnosis present

## 2021-04-08 DIAGNOSIS — J90 Pleural effusion, not elsewhere classified: Secondary | ICD-10-CM

## 2021-04-08 DIAGNOSIS — I2699 Other pulmonary embolism without acute cor pulmonale: Secondary | ICD-10-CM | POA: Diagnosis present

## 2021-04-08 DIAGNOSIS — J9601 Acute respiratory failure with hypoxia: Secondary | ICD-10-CM | POA: Diagnosis present

## 2021-04-08 DIAGNOSIS — R1310 Dysphagia, unspecified: Secondary | ICD-10-CM

## 2021-04-08 DIAGNOSIS — R7303 Prediabetes: Secondary | ICD-10-CM | POA: Diagnosis present

## 2021-04-08 LAB — CBC WITH DIFFERENTIAL/PLATELET
Abs Immature Granulocytes: 0.01 10*3/uL (ref 0.00–0.07)
Basophils Absolute: 0.1 10*3/uL (ref 0.0–0.1)
Basophils Relative: 1 %
Eosinophils Absolute: 0.5 10*3/uL (ref 0.0–0.5)
Eosinophils Relative: 5 %
HCT: 46.1 % (ref 39.0–52.0)
Hemoglobin: 14.9 g/dL (ref 13.0–17.0)
Immature Granulocytes: 0 %
Lymphocytes Relative: 31 %
Lymphs Abs: 3.1 10*3/uL (ref 0.7–4.0)
MCH: 26.5 pg (ref 26.0–34.0)
MCHC: 32.3 g/dL (ref 30.0–36.0)
MCV: 81.9 fL (ref 80.0–100.0)
Monocytes Absolute: 1.1 10*3/uL — ABNORMAL HIGH (ref 0.1–1.0)
Monocytes Relative: 11 %
Neutro Abs: 5.3 10*3/uL (ref 1.7–7.7)
Neutrophils Relative %: 52 %
Platelets: 341 10*3/uL (ref 150–400)
RBC: 5.63 MIL/uL (ref 4.22–5.81)
RDW: 15.1 % (ref 11.5–15.5)
WBC: 10.2 10*3/uL (ref 4.0–10.5)
nRBC: 0 % (ref 0.0–0.2)

## 2021-04-08 LAB — COMPREHENSIVE METABOLIC PANEL
ALT: 30 U/L (ref 0–44)
AST: 32 U/L (ref 15–41)
Albumin: 3.5 g/dL (ref 3.5–5.0)
Alkaline Phosphatase: 66 U/L (ref 38–126)
Anion gap: 8 (ref 5–15)
BUN: 22 mg/dL — ABNORMAL HIGH (ref 6–20)
CO2: 21 mmol/L — ABNORMAL LOW (ref 22–32)
Calcium: 9.1 mg/dL (ref 8.9–10.3)
Chloride: 104 mmol/L (ref 98–111)
Creatinine, Ser: 1.35 mg/dL — ABNORMAL HIGH (ref 0.61–1.24)
GFR, Estimated: >60 mL/min (ref >60)
Glucose, Bld: 107 mg/dL — ABNORMAL HIGH (ref 70–99)
Potassium: 4.3 mmol/L (ref 3.5–5.1)
Sodium: 133 mmol/L — ABNORMAL LOW (ref 135–145)
Total Bilirubin: 0.7 mg/dL (ref 0.3–1.2)
Total Protein: 7.7 g/dL (ref 6.5–8.1)

## 2021-04-08 LAB — HEPARIN LEVEL (UNFRACTIONATED)
Heparin Unfractionated: >1.1 IU/mL — ABNORMAL HIGH (ref 0.30–0.70)
Heparin Unfractionated: >1.1 IU/mL — ABNORMAL HIGH (ref 0.30–0.70)

## 2021-04-08 LAB — RESP PANEL BY RT-PCR (FLU A&B, COVID) ARPGX2
Influenza A by PCR: NEGATIVE
Influenza B by PCR: NEGATIVE
SARS Coronavirus 2 by RT PCR: NEGATIVE

## 2021-04-08 LAB — D-DIMER, QUANTITATIVE: D-Dimer, Quant: 1.88 ug/mL-FEU — ABNORMAL HIGH (ref 0.00–0.50)

## 2021-04-08 LAB — LACTIC ACID, PLASMA: Lactic Acid, Venous: 1 mmol/L (ref 0.5–1.9)

## 2021-04-08 LAB — TROPONIN I (HIGH SENSITIVITY)
Troponin I (High Sensitivity): 18 ng/L — ABNORMAL HIGH (ref <18)
Troponin I (High Sensitivity): 16 ng/L (ref <18)

## 2021-04-08 LAB — BRAIN NATRIURETIC PEPTIDE: B Natriuretic Peptide: 172.7 pg/mL — ABNORMAL HIGH (ref 0.0–100.0)

## 2021-04-08 MED ORDER — IOHEXOL 350 MG/ML SOLN
100.0000 mL | Freq: Once | INTRAVENOUS | Status: AC
  Administered 2021-04-08: 100 mL via INTRAVENOUS

## 2021-04-08 MED ORDER — ROSUVASTATIN CALCIUM 20 MG PO TABS
40.0000 mg | ORAL_TABLET | Freq: Every day | ORAL | Status: DC
  Administered 2021-04-09 – 2021-04-14 (×6): 40 mg via ORAL
  Filled 2021-04-08 (×6): qty 2

## 2021-04-08 MED ORDER — HEPARIN (PORCINE) 25000 UT/250ML-% IV SOLN
1600.0000 [IU]/h | INTRAVENOUS | Status: DC
  Administered 2021-04-09 – 2021-04-10 (×3): 1400 [IU]/h via INTRAVENOUS
  Administered 2021-04-11: 1500 [IU]/h via INTRAVENOUS
  Administered 2021-04-12 – 2021-04-13 (×3): 1650 [IU]/h via INTRAVENOUS
  Administered 2021-04-14: 1600 [IU]/h via INTRAVENOUS
  Filled 2021-04-08 (×8): qty 250

## 2021-04-08 MED ORDER — ALBUTEROL SULFATE (2.5 MG/3ML) 0.083% IN NEBU
2.5000 mg | INHALATION_SOLUTION | Freq: Four times a day (QID) | RESPIRATORY_TRACT | Status: DC | PRN
  Administered 2021-04-12: 2.5 mg via RESPIRATORY_TRACT
  Filled 2021-04-08: qty 3

## 2021-04-08 MED ORDER — ONDANSETRON HCL 4 MG PO TABS: 4.0000 mg | ORAL_TABLET | Freq: Four times a day (QID) | ORAL | Status: DC | PRN

## 2021-04-08 MED ORDER — ONDANSETRON HCL 4 MG/2ML IJ SOLN: 4.0000 mg | Freq: Four times a day (QID) | INTRAMUSCULAR | Status: DC | PRN

## 2021-04-08 MED ORDER — ACETAMINOPHEN 500 MG PO TABS
1000.0000 mg | ORAL_TABLET | Freq: Once | ORAL | Status: AC
  Administered 2021-04-08: 1000 mg via ORAL
  Filled 2021-04-08: qty 2

## 2021-04-08 MED ORDER — HEPARIN (PORCINE) 25000 UT/250ML-% IV SOLN
1700.0000 [IU]/h | INTRAVENOUS | Status: DC
  Administered 2021-04-08: 1700 [IU]/h via INTRAVENOUS
  Filled 2021-04-08: qty 250

## 2021-04-08 MED ORDER — OXYCODONE HCL 5 MG PO TABS
5.0000 mg | ORAL_TABLET | ORAL | Status: DC | PRN
  Administered 2021-04-08 – 2021-04-11 (×7): 5 mg via ORAL
  Filled 2021-04-08 (×7): qty 1

## 2021-04-08 MED ORDER — HEPARIN BOLUS VIA INFUSION
5000.0000 [IU] | Freq: Once | INTRAVENOUS | Status: AC
  Administered 2021-04-08: 5000 [IU] via INTRAVENOUS

## 2021-04-08 MED ORDER — SODIUM CHLORIDE 0.9 % IV SOLN
INTRAVENOUS | Status: DC | PRN
  Administered 2021-04-08: 1000 mL via INTRAVENOUS

## 2021-04-08 MED ORDER — ACETAMINOPHEN 325 MG PO TABS
650.0000 mg | ORAL_TABLET | Freq: Four times a day (QID) | ORAL | Status: DC | PRN
  Administered 2021-04-08 – 2021-04-11 (×5): 650 mg via ORAL
  Filled 2021-04-08 (×5): qty 2

## 2021-04-08 MED ORDER — ACETAMINOPHEN 650 MG RE SUPP: 650.0000 mg | Freq: Four times a day (QID) | RECTAL | Status: DC | PRN

## 2021-04-08 NOTE — Progress Notes (Signed)
ANTICOAGULATION CONSULT NOTE  Pharmacy Consult for heparin Indication: pulmonary embolus  No Known Allergies  Patient Measurements: Height: 5\' 7"  (170.2 cm) Weight: 101.2 kg (223 lb) IBW/kg (Calculated) : 66.1 Heparin Dosing Weight: 101 kg   Vital Signs: Temp: 98.8 F (37.1 C) (05/29 1948) Temp Source: Oral (05/29 1948) BP: 142/95 (05/29 1948) Pulse Rate: 97 (05/29 1948)  Labs: Recent Labs    04/08/21 1146 04/08/21 1359 04/08/21 2037 04/08/21 2135  HGB 14.9  --   --   --   HCT 46.1  --   --   --   PLT 341  --   --   --   HEPARINUNFRC  --   --  >1.10* >1.10*  CREATININE 1.35*  --   --   --   TROPONINIHS 18* 16  --   --     Estimated Creatinine Clearance: 70 mL/min (A) (by C-G formula based on SCr of 1.35 mg/dL (H)).   Medical History: Past Medical History:  Diagnosis Date  . Acute systolic CHF (congestive heart failure) (HCC) 03/26/2021  . Hyperlipidemia   . Hypertension   . Obese     Medications:  Medications Prior to Admission  Medication Sig Dispense Refill Last Dose  . acetaminophen (TYLENOL) 325 MG tablet Take 325-650 mg by mouth every 6 (six) hours as needed (for headaches).   unknown  . carvedilol (COREG) 6.25 MG tablet Take 1 tablet (6.25 mg total) by mouth 2 (two) times daily with a meal. 60 tablet 0 04/08/2021 at 9am  . dapagliflozin propanediol (FARXIGA) 10 MG TABS tablet Take 1 tablet (10 mg total) by mouth daily. 30 tablet 0 04/08/2021 at am  . furosemide (LASIX) 20 MG tablet Take 1 tablet (20 mg total) by mouth daily as needed for fluid or edema. 30 tablet 0 couple days ago  . rosuvastatin (CRESTOR) 40 MG tablet Take 1 tablet (40 mg total) by mouth daily. 30 tablet 0 04/08/2021 at am  . sacubitril-valsartan (ENTRESTO) 49-51 MG Take 1 tablet by mouth 2 (two) times daily. 60 tablet 0 04/08/2021 at am  . spironolactone (ALDACTONE) 25 MG tablet Take 1 tablet (25 mg total) by mouth daily. 30 tablet 0 04/08/2021 at am    Assessment: 55 YOM found to have  an acute PE. Pharmacy consulted to start IV heparin.   Heparin level undetectably high (>1.1) - confirmed on redraw was from opposite arm. Hgb 14.9, plt 341. D-dimer 1.88. No s/sx of bleeding or infusion.   Goal of Therapy:  Heparin level 0.3-0.7 units/ml Monitor platelets by anticoagulation protocol: Yes   Plan:  -Hold infusion for 1 hour  -Reduce heparin infusion to 1400 units/hr -Order heparin level 6 hours after restart -Monitor HL, CBC, and for s/sx of bleeding   04/10/2021, PharmD, BCCCP Clinical Pharmacist  Phone: [phone] 04/08/2021 10:28 PM  Please check AMION for all [facility] phone numbers After 10:00 PM, call Main Pharmacy [phone]

## 2021-04-08 NOTE — Plan of Care (Signed)

## 2021-04-08 NOTE — Progress Notes (Signed)
Spoke to Charity fundraiser at [facility].  She advised that the COVID 19 was collected and sent via carrier to lab to be tested.  They  Had originally entered the incorrect testing, and should been the 2 hour one. And they corrected it. And not to recollect the sample and send. She advised that she notified the lab and to run the 2 hour lab.  This was contact was completed around 1710 this afternoon.

## 2021-04-08 NOTE — ED Provider Notes (Signed)
[facility] EMERGENCY DEPARTMENT Provider Note   CSN: [id number] Arrival date & time: 04/08/21  1033     History Chief Complaint  Patient presents with  . Hemoptysis     Andres Roman is a 55 y.o. male with PMHx HTN, HLD, CHF with EF 45%, CKD stage III who presents to the ED today with complaint of hemoptysis that he noticed this morning. Pt reports he woke up and felt a burning sensation in his chest. He got up and then started coughing. He states he coughed into a rag and noticed blood tinged sputum. At first he thought he was coughing up bits of watermelon because he ate that last night however then he noticed it was blood. The chest pain resolved after 15 minutes but he continues to cough. He denies any SOB. He was just recently admitted to the hospital from 05/15-05/19 after presenting with complaint of SOB x 1 month. Pt was found to be in CHF exacerbation with BNP 1966 and was admitted to the hospital. He reports he had been doing well since being discharged until today. He is not anticoagulated. He denies continued SOB. He states he has been compliant with his medications and has been weighing himself daily without any increased weight gain.   The history is provided by the patient and a relative.       Past Medical History:  Diagnosis Date  . Acute systolic CHF (congestive heart failure) (HCC) 03/26/2021  . Hyperlipidemia   . Hypertension   . Obese     Patient Active Problem List   Diagnosis Date Noted  . CKD (chronic kidney disease) stage 3, GFR 30-59 ml/min (HCC) 03/29/2021  . Prediabetes 03/29/2021  . NICM (nonischemic cardiomyopathy) (HCC) 03/29/2021  . Acute systolic heart failure (HCC)   . Microalbuminuria 04/09/2014  . Allergic rhinitis 04/09/2014  . Essential hypertension, benign 01/30/2014  . HLD (hyperlipidemia) 01/30/2014  . Other malaise and fatigue 01/30/2014  . Unspecified vitamin D deficiency 01/30/2014    Past Surgical History:  Procedure  Laterality Date  . APPENDECTOMY    . RIGHT/LEFT HEART CATH AND CORONARY ANGIOGRAPHY N/A 03/28/2021   Procedure: RIGHT/LEFT HEART CATH AND CORONARY ANGIOGRAPHY;  Surgeon: Iran Ouch, MD;  Location: [facility] INVASIVE CV LAB;  Service: Cardiovascular;  Laterality: N/A;       Family History  Problem Relation Age of Onset  . Diabetes Mother   . Hypertension Mother   . Stroke Mother   . Hypertension Brother   . Diabetes Brother   . Heart disease Paternal Grandmother     Social History   Tobacco Use  . Smoking status: Never Smoker  . Smokeless tobacco: Never Used  Vaping Use  . Vaping Use: Never used  Substance Use Topics  . Alcohol use: No  . Drug use: No    Home Medications Prior to Admission medications   Medication Sig Start Date End Date Taking? Authorizing Provider  acetaminophen (TYLENOL) 325 MG tablet Take 325-650 mg by mouth every 6 (six) hours as needed (for headaches).    [provider]  carvedilol (COREG) 6.25 MG tablet Take 1 tablet (6.25 mg total) by mouth 2 (two) times daily with a meal. 03/29/21   Noralee Stain, DO  dapagliflozin propanediol (FARXIGA) 10 MG TABS tablet Take 1 tablet (10 mg total) by mouth daily. 03/29/21   Noralee Stain, DO  furosemide (LASIX) 20 MG tablet Take 1 tablet (20 mg total) by mouth daily as needed for fluid or edema.  03/29/21 04/28/21  Noralee Stain, DO  rosuvastatin (CRESTOR) 40 MG tablet Take 1 tablet (40 mg total) by mouth daily. 03/29/21   Noralee Stain, DO  sacubitril-valsartan (ENTRESTO) 49-51 MG Take 1 tablet by mouth 2 (two) times daily. 03/29/21   Noralee Stain, DO  spironolactone (ALDACTONE) 25 MG tablet Take 1 tablet (25 mg total) by mouth daily. 03/29/21   Noralee Stain, DO    Allergies    Patient has no known allergies.  Review of Systems   Review of Systems  Constitutional: Negative for chills and fever.  Respiratory: Positive for cough. Negative for shortness of breath.        + coughing up blood   Cardiovascular: Positive for chest pain. Negative for palpitations and leg swelling.  Gastrointestinal: Negative for nausea and vomiting.  All other systems reviewed and are negative.   Physical Exam Updated Vital Signs BP 99/82   Pulse 92   Temp 99.3 F (37.4 C) (Oral)   Resp 18   Ht 5\' 7"  (1.702 m)   Wt 101.2 kg   SpO2 96%   BMI 34.93 kg/m   Physical Exam Vitals and nursing note reviewed.  Constitutional:      Appearance: He is obese. He is not ill-appearing or diaphoretic.  HENT:     Head: Normocephalic and atraumatic.  Eyes:     Conjunctiva/sclera: Conjunctivae normal.  Cardiovascular:     Rate and Rhythm: Normal rate and regular rhythm.     Pulses: Normal pulses.     Heart sounds: Murmur heard.    Pulmonary:     Effort: Pulmonary effort is normal.     Breath sounds: Normal breath sounds. No wheezing, rhonchi or rales.  Abdominal:     Palpations: Abdomen is soft.     Tenderness: There is no abdominal tenderness. There is no guarding or rebound.  Musculoskeletal:     Cervical back: Neck supple.     Right lower leg: No edema.     Left lower leg: No edema.  Skin:    General: Skin is warm and dry.  Neurological:     Mental Status: He is alert.     ED Results / Procedures / Treatments   Labs (all labs ordered are listed, but only abnormal results are displayed) Labs Reviewed  BRAIN NATRIURETIC PEPTIDE - Abnormal; Notable for the following components:      Result Value   B Natriuretic Peptide 172.7 (*)    All other components within normal limits  COMPREHENSIVE METABOLIC PANEL - Abnormal; Notable for the following components:   Sodium 133 (*)    CO2 21 (*)    Glucose, Bld 107 (*)    BUN 22 (*)    Creatinine, Ser 1.35 (*)    All other components within normal limits  CBC WITH DIFFERENTIAL/PLATELET - Abnormal; Notable for the following components:   Monocytes Absolute 1.1 (*)    All other components within normal limits  D-DIMER, QUANTITATIVE -  Abnormal; Notable for the following components:   D-Dimer, Quant 1.88 (*)    All other components within normal limits  TROPONIN I (HIGH SENSITIVITY) - Abnormal; Notable for the following components:   Troponin I (High Sensitivity) 18 (*)    All other components within normal limits  SARS CORONAVIRUS 2 (TAT 6-24 HRS)  LACTIC ACID, PLASMA  LACTIC ACID, PLASMA  TROPONIN I (HIGH SENSITIVITY)    EKG EKG Interpretation  Date/Time:  Sunday Apr 08 2021 11:08:49 EDT Ventricular Rate:  92 PR  Interval:  174 QRS Duration: 96 QT Interval:  344 QTC Calculation: 425 R Axis:   49 Text Interpretation: Normal sinus rhythm T wave abnormality, consider lateral ischemia Abnormal ECG Similar TW abnormality to previous Confirmed by Alvira Monday ([id number]) on 04/08/2021 12:01:05 PM   Radiology CT ANGIO CHEST PE W OR WO CONTRAST  Addendum Date: 04/08/2021   ADDENDUM REPORT: 04/08/2021 13:32 ADDENDUM: These results were called by telephone at the time of interpretation on 04/08/2021 at 1:32 pm to provider Dr. Dalene Seltzer, who verbally acknowledged these results. Electronically Signed   By: Ted Mcalpine M.D.   On: 04/08/2021 13:32   Result Date: 04/08/2021 CLINICAL DATA:  PE suspected. EXAM: CT ANGIOGRAPHY CHEST WITH CONTRAST TECHNIQUE: Multidetector CT imaging of the chest was performed using the standard protocol during bolus administration of intravenous contrast. Multiplanar CT image reconstructions and MIPs were obtained to evaluate the vascular anatomy. CONTRAST:  OMNIPAQUE IOHEXOL 350 MG/ML SOLN COMPARISON:  None. FINDINGS: Cardiovascular: Satisfactory opacification of the pulmonary arteries to the segmental level. There is an occlusive pulmonary embolus within a segmental artery in the right lower lobe. Normal heart size. No pericardial effusion. Mediastinum/Nodes: Mildly enlarged periesophageal lymph nodes. Esophagus, trachea and the thyroid gland look normal. Lungs/Pleura: Geometric area of  ground-glass opacity peripherally in the superior segment of the right lower lobe in the distribution of the pulmonary embolus. Upper Abdomen: No acute abnormality. Musculoskeletal: No chest wall abnormality. No acute or significant osseous findings. Review of the MIP images confirms the above findings. IMPRESSION: 1. Occlusive pulmonary embolus within a segmental artery in the right lower lobe. 2. Geometric area of ground-glass opacity peripherally in the superior segment of the right lower lobe in the distribution of the pulmonary embolus. This may represent an area of pulmonary infarct. 3. Mildly enlarged periesophageal lymph nodes, nonspecific. Electronically Signed: By: Ted Mcalpine M.D. On: 04/08/2021 13:27   DG Chest Port 1 View  Result Date: 04/08/2021 CLINICAL DATA:  55 year old male with clinical concern for potential fluid overload. EXAM: PORTABLE CHEST 1 VIEW COMPARISON:  Chest x-ray 03/25/2021. FINDINGS: Opacity in the periphery of the right lung base obscuring the lateral aspect of the right hemidiaphragm. Left lung is clear. No definite pleural effusions. No evidence of pulmonary edema. No pneumothorax. Heart size is normal. Upper mediastinal contours are within normal limits. Aortic atherosclerosis. IMPRESSION: 1. Ill-defined opacity in the periphery of the right lung base concerning for pneumonia. Followup PA and lateral chest X-ray is recommended in 3-4 weeks following trial of antibiotic therapy to ensure resolution and exclude underlying malignancy. 2. Aortic atherosclerosis. 3. No findings to suggest pulmonary edema or fluid overload. Electronically Signed   By: Trudie Reed M.D.   On: 04/08/2021 12:32    Procedures .Critical Care Performed by: Tanda Rockers, PA-C Authorized by: Tanda Rockers, PA-C   Critical care provider statement:    Critical care time (minutes):  45   Critical care was time spent personally by me on the following activities:  Discussions with  consultants, evaluation of patient's response to treatment, examination of patient, ordering and performing treatments and interventions, ordering and review of laboratory studies, ordering and review of radiographic studies, pulse oximetry, re-evaluation of patient's condition, obtaining history from patient or surrogate and review of old charts     Medications Ordered in ED Medications  iohexol (OMNIPAQUE) 350 MG/ML injection 100 mL (100 mLs Intravenous Contrast Given 04/08/21 1259)    ED Course  I have reviewed the triage vital signs and the  nursing notes.  Pertinent labs & imaging results that were available during my care of the patient were reviewed by me and considered in my medical decision making (see chart for details).  Clinical Course as of 04/08/21 1349  Sun Apr 08, 2021  1228 D-Dimer, Quant(!): 1.88 [MV]    Clinical Course User Index [MV] Tanda Rockers, PA-C   MDM Rules/Calculators/A&P                          55 year old male who presents to the ED today with complaint of hemoptysis that began this morning.  Recent admission to the hospital in 03/1504/19 for CHF exacerbation with EF 45%.  On arrival to the ED today vitals are stable.  Patient is afebrile, nontachycardic and nontachypneic and appears to be in no acute distress.  Speaking in full sentences without difficulty and satting 96% on room air.  He denies any shortness of breath.  He did have a brief episode of a burning sensation in his chest this morning prior to beginning to cough.  Will work-up for PE at this time; given national contrast shortage will obtain Dimer prior to CTA; we do not have VQ capability here.  Given recent admission for CHF exacerbation will obtain repeat BNP, chest x-ray, troponins today.  EKG unchanged from previous CBC without leukocytosis; hgb stable at 14.9 CMP with sodium 133, bicarb 21. Creatinine 1.35 and BUN 22. GFR > 60. Glucose 107. No other electrolyte abnormalities.  Troponin  18 BNP 172.7  CXR:   IMPRESSION:  1. Ill-defined opacity in the periphery of the right lung base  concerning for pneumonia. Followup PA and lateral chest X-ray is  recommended in 3-4 weeks following trial of antibiotic therapy to  ensure resolution and exclude underlying malignancy.  2. Aortic atherosclerosis.  3. No findings to suggest pulmonary edema or fluid overload.   D dimer elevated at 1.88. Pt without complaints of SOB at this time and is nontachycardic however given recent hospitalization PE is on the differential. Question new pneumonia on CXR causing hemoptysis vs PE. Have discussed case with attending physician Dr. Dalene Seltzer who recommends obtaining CTA at this time given elevated d dimer and unable to perform VQ scan at this facility. If workup overall reassuring pt is stable enough to be discharged home and does not require admission for VQ testing.   CTA: IMPRESSION:  1. Occlusive pulmonary embolus within a segmental artery in the  right lower lobe.  2. Geometric area of ground-glass opacity peripherally in the  superior segment of the right lower lobe in the distribution of the  pulmonary embolus. This may represent an area of pulmonary infarct.  3. Mildly enlarged periesophageal lymph nodes, nonspecific.   Heparin initiated. COVID test ordered. Discussed case with [facility] Dr. Marikay Alar who agrees to accept patient for admission.   This note was prepared using Dragon voice recognition software and may include unintentional dictation errors due to the inherent limitations of voice recognition software.  Final Clinical Impression(s) / ED Diagnoses Final diagnoses:  Single subsegmental pulmonary embolism without acute cor pulmonale (HCC)  Pulmonary infarct [facility])    Rx / DC Orders ED Discharge Orders    None       Tanda Rockers, PA-C 04/08/21 1350    Alvira Monday, MD 04/10/21 1453

## 2021-04-08 NOTE — ED Notes (Signed)
Pt transported to CT ?

## 2021-04-08 NOTE — ED Notes (Signed)
Family given room assignment

## 2021-04-08 NOTE — Progress Notes (Signed)
ANTICOAGULATION CONSULT NOTE - Initial Consult  Pharmacy Consult for heparin Indication: pulmonary embolus  No Known Allergies  Patient Measurements: Height: 5\' 7"  (170.2 cm) Weight: 101.2 kg (223 lb) IBW/kg (Calculated) : 66.1 Heparin Dosing Weight: 101 kg   Vital Signs: Temp: 99.3 F (37.4 C) (05/29 1101) Temp Source: Oral (05/29 1101) BP: 105/77 (05/29 1230) Pulse Rate: 86 (05/29 1230)  Labs: Recent Labs    04/08/21 1146  HGB 14.9  HCT 46.1  PLT 341  CREATININE 1.35*  TROPONINIHS 18*    Estimated Creatinine Clearance: 70 mL/min (A) (by C-G formula based on SCr of 1.35 mg/dL (H)).   Medical History: Past Medical History:  Diagnosis Date  . Acute systolic CHF (congestive heart failure) (HCC) 03/26/2021  . Hyperlipidemia   . Hypertension   . Obese     Medications:  (Not in a hospital admission)   Assessment: 66 YOM found to have an acute PE. Pharmacy consulted to start IV heparin. H/H wnl, Plt wnl. SCr 1.35  Goal of Therapy:  Heparin level 0.3-0.7 units/ml Monitor platelets by anticoagulation protocol: Yes   Plan:  -Heparin 5000 units IV bolus followed by heparin infusion at 1700 units/hr -F/u 6 hr HL -Monitor daily HL, CBC and s/s of bleeding    53, PharmD., BCPS, BCCCP Clinical Pharmacist Please refer to [facility] for unit-specific pharmacist

## 2021-04-08 NOTE — H&P (Signed)
History and Physical  Patient Name: Andres Roman     JHE:[id number]    DOB: 09/29/1966    DOA: 04/08/2021 PCP: Gillian Scarce, MD  Patient coming from: Home  Chief Complaint: Hemoptysis    HPI: Andres Roman is a 55 y.o. male, with PMH of hypertension, dyslipidemia, prediabetes, systolic CHF, CKD 3 A who presented to the ER on 04/08/2021 with hemoptysis.  Patient was recently hospitalized 2 weeks ago for new onset acute systolic CHF.  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  He was doing well however this morning he had hemoptysis along with a burning sensation in his chest.  He also had some coughing.  He had a few more episodes, with coughing up some blood, thus he presented to outside ER for evaluation.  He denies similar symptoms in the past.  Not on any blood thinners.  He denies any significant dyspnea.  He denies any pain in bilateral lower extremities.  Denies history of blood clots in the past.    ED course: -Vitals on admission:, Heart rate 92, respiratory rate 18, blood pressure 99/82, maintaining sats on room air -Labs on initial presentation: Sodium 133, potassium 4.3, chloride 104, bicarb 21, glucose 107, BUN 22, creatinine 1.35, BNP 172, troponin 19, WBC 10.2, hemoglobin 15 -Imaging obtained on admission: CTA of the chest demonstrated occlusive pulmonary embolus within the segmental artery in the right lower lobe with groundglass opacity in the right lower lobe likely pulmonary infarct -In the ED the patient was given Tylenol, and started on heparin drip. The hospitalist service was contacted for further evaluation and management.     ROS: A complete and thorough 12 point review of systems obtained, negative listed in HPI.     Past Medical History:  Diagnosis Date  . Acute systolic CHF (congestive heart failure) (HCC) 03/26/2021  . Hyperlipidemia   . Hypertension   . Obese     Past Surgical History:  Procedure  Laterality Date  . APPENDECTOMY    . RIGHT/LEFT HEART CATH AND CORONARY ANGIOGRAPHY N/A 03/28/2021   Procedure: RIGHT/LEFT HEART CATH AND CORONARY ANGIOGRAPHY;  Surgeon: Iran Ouch, MD;  Location: [facility];  Service: Cardiovascular;  Laterality: N/A;    Social History: Patient lives at home.  The patient walks without assistance.  Non smoker.  No Known Allergies  Family history: family history includes Diabetes in his brother and mother; Heart disease in his paternal grandmother; Hypertension in his brother and mother; Stroke in his mother.  Prior to Admission medications   Medication Sig Start Date End Date Taking? Authorizing Provider  acetaminophen (TYLENOL) 325 MG tablet Take 325-650 mg by mouth every 6 (six) hours as needed (for headaches).    [provider]  carvedilol (COREG) 6.25 MG tablet Take 1 tablet (6.25 mg total) by mouth 2 (two) times daily with a meal. 03/29/21   Noralee Stain, DO  dapagliflozin propanediol (FARXIGA) 10 MG TABS tablet Take 1 tablet (10 mg total) by mouth daily. 03/29/21   Noralee Stain, DO  furosemide (LASIX) 20 MG tablet Take 1 tablet (20 mg total) by mouth daily as needed for fluid or edema. 03/29/21 04/28/21  Noralee Stain, DO  rosuvastatin (CRESTOR) 40 MG tablet Take 1 tablet (40 mg total) by mouth daily. 03/29/21   Noralee Stain, DO  sacubitril-valsartan (ENTRESTO) 49-51 MG Take 1 tablet by mouth 2 (two) times daily. 03/29/21   Noralee Stain, DO  spironolactone (ALDACTONE) 25  MG tablet Take 1 tablet (25 mg total) by mouth daily. 03/29/21   Noralee Stain, DO       Physical Exam: BP 108/80   Pulse 86   Temp 98.1 F (36.7 C) (Oral)   Resp (!) 21   Ht 5\' 7"  (1.702 m)   Wt 101.2 kg   SpO2 94%   BMI 34.93 kg/m   General appearance: Well-developed, adult male, alert and in no acute distress .   Eyes: Anicteric, conjunctiva pink, lids and lashes normal. PERRL.    ENT: No nasal deformity, discharge, epistaxis.  Hearing  intact. OP moist without lesions.   Neck: No neck masses.  Trachea midline.  No thyromegaly/tenderness. Lymph: No cervical or supraclavicular lymphadenopathy. Skin: Warm and dry.  No jaundice.  No suspicious rashes or lesions. Cardiac: RRR, nl S1-S2, no murmurs appreciated.    Trace LE edema.  Radial and pedal pulses 2+ and symmetric. Respiratory: Normal respiratory rate and rhythm.    Coarse breath sounds bilaterally, no wheezing Abdomen: Abdomen soft.  No tenderness with palpation. No ascites, distension, hepatosplenomegaly.   MSK: No deformities or effusions of the large joints of the upper or lower extremities bilaterally.  No cyanosis or clubbing.  No pain with palpation on bilateral lower extremities Neuro: Cranial nerves 2 through 12 grossly intact.  Sensation intact to light touch. Speech is fluent.     Psych: Sensorium intact and responding to questions, attention normal.  Behavior appropriate.  Judgment and insight appear normal.    Labs on Admission:  I have personally reviewed following labs and imaging studies: CBC: Recent Labs  Lab 04/08/21 1146  WBC 10.2  NEUTROABS 5.3  HGB 14.9  HCT 46.1  MCV 81.9  PLT 341   Basic Metabolic Panel: Recent Labs  Lab 04/03/21 0956 04/08/21 1146  NA 134* 133*  K 4.7 4.3  CL 104 104  CO2 22 21*  GLUCOSE 94 107*  BUN 29* 22*  CREATININE 1.58* 1.35*  CALCIUM 9.1 9.1   GFR: Estimated Creatinine Clearance: 70 mL/min (A) (by C-G formula based on SCr of 1.35 mg/dL (H)).  Liver Function Tests: Recent Labs  Lab 04/08/21 1146  AST 32  ALT 30  ALKPHOS 66  BILITOT 0.7  PROT 7.7  ALBUMIN 3.5   No results for input(s): LIPASE, AMYLASE in the last 168 hours. No results for input(s): AMMONIA in the last 168 hours. Coagulation Profile: No results for input(s): INR, PROTIME in the last 168 hours. Cardiac Enzymes: No results for input(s): CKTOTAL, CKMB, CKMBINDEX, TROPONINI in the last 168 hours. BNP (last 3 results) No results  for input(s): PROBNP in the last 8760 hours. HbA1C: No results for input(s): HGBA1C in the last 72 hours. CBG: No results for input(s): GLUCAP in the last 168 hours. Lipid Profile: No results for input(s): CHOL, HDL, LDLCALC, TRIG, CHOLHDL, LDLDIRECT in the last 72 hours. Thyroid Function Tests: No results for input(s): TSH, T4TOTAL, FREET4, T3FREE, THYROIDAB in the last 72 hours. Anemia Panel: No results for input(s): VITAMINB12, FOLATE, FERRITIN, TIBC, IRON, RETICCTPCT in the last 72 hours.   No results found for this or any previous visit (from the past 240 hour(s)).         Radiological Exams on Admission: Personally reviewed imaging which shows: CTA of the chest demonstrated occlusive pulmonary embolus within the segmental artery in the right lower lobe with groundglass opacity in the right lower lobe likely pulmonary infarct CT ANGIO CHEST PE W OR WO CONTRAST  Addendum Date: 04/08/2021   ADDENDUM REPORT: 04/08/2021 13:32 ADDENDUM: These results were called by telephone at the time of interpretation on 04/08/2021 at 1:32 pm to provider Dr. Dalene Seltzer, who verbally acknowledged these results. Electronically Signed   By: Ted Mcalpine M.D.   On: 04/08/2021 13:32   Result Date: 04/08/2021 CLINICAL DATA:  PE suspected. EXAM: CT ANGIOGRAPHY CHEST WITH CONTRAST TECHNIQUE: Multidetector CT imaging of the chest was performed using the standard protocol during bolus administration of intravenous contrast. Multiplanar CT image reconstructions and MIPs were obtained to evaluate the vascular anatomy. CONTRAST:  OMNIPAQUE IOHEXOL 350 MG/ML SOLN COMPARISON:  None. FINDINGS: Cardiovascular: Satisfactory opacification of the pulmonary arteries to the segmental level. There is an occlusive pulmonary embolus within a segmental artery in the right lower lobe. Normal heart size. No pericardial effusion. Mediastinum/Nodes: Mildly enlarged periesophageal lymph nodes. Esophagus, trachea and the  thyroid gland look normal. Lungs/Pleura: Geometric area of ground-glass opacity peripherally in the superior segment of the right lower lobe in the distribution of the pulmonary embolus. Upper Abdomen: No acute abnormality. Musculoskeletal: No chest wall abnormality. No acute or significant osseous findings. Review of the MIP images confirms the above findings. IMPRESSION: 1. Occlusive pulmonary embolus within a segmental artery in the right lower lobe. 2. Geometric area of ground-glass opacity peripherally in the superior segment of the right lower lobe in the distribution of the pulmonary embolus. This may represent an area of pulmonary infarct. 3. Mildly enlarged periesophageal lymph nodes, nonspecific. Electronically Signed: By: Ted Mcalpine M.D. On: 04/08/2021 13:27   DG Chest Port 1 View  Result Date: 04/08/2021 CLINICAL DATA:  55 year old male with clinical concern for potential fluid overload. EXAM: PORTABLE CHEST 1 VIEW COMPARISON:  Chest x-ray 03/25/2021. FINDINGS: Opacity in the periphery of the right lung base obscuring the lateral aspect of the right hemidiaphragm. Left lung is clear. No definite pleural effusions. No evidence of pulmonary edema. No pneumothorax. Heart size is normal. Upper mediastinal contours are within normal limits. Aortic atherosclerosis. IMPRESSION: 1. Ill-defined opacity in the periphery of the right lung base concerning for pneumonia. Followup PA and lateral chest X-ray is recommended in 3-4 weeks following trial of antibiotic therapy to ensure resolution and exclude underlying malignancy. 2. Aortic atherosclerosis. 3. No findings to suggest pulmonary edema or fluid overload. Electronically Signed   By: Trudie Reed M.D.   On: 04/08/2021 12:32         Assessment/Plan   1.  Acute PE right lower lobe artery - CTA of the chest demonstrated occlusive pulmonary embolus within the segmental artery in the right lower lobe with groundglass opacity in the right  lower lobe likely pulmonary infarct - Started on heparin drip, continue - Echocardiogram to evaluate for right heart strain - Bilateral lower extremity venous ultrasound to evaluate for DVT - Telemetry - Hold home BP medications due to soft blood pressure - Possibly can transition to oral anticoagulant agent tomorrow  2.  Hemoptysis -Secondary to PE - See further plans above  3.  Systolic CHF -No acute exacerbation at this time - Recently hospitalized for new onset CHF - We will hold home BP meds and diuretics given soft BP - Daily weights  4.  Essential hypertension -Blood pressure currently within normal range but soft - Hold home BP meds for now - Monitor blood pressure closely  5.  Dyslipidemia -Continue home Crestor  6.  Prediabetes - Hold home dapagliflozin given soft BP     DVT prophylaxis: Heparin drip  Code Status: Full Family Communication: Patient only Disposition Plan: Anticipate discharge home when medically optimized Consults called: None Admission status: Observation with telemetry   At the point of initial evaluation, it is my clinical opinion that admission for OBSERVATION is reasonable and necessary because the patient's presenting complaints in the context of their chronic conditions represent sufficient risk of deterioration or significant morbidity to constitute reasonable grounds for close observation in the hospital setting, but that the patient may be medically stable for discharge from the hospital within 24 to 48 hours.    Medical decision making: Patient seen at 6:05 PM on 04/08/2021.  The patient was discussed with ER provider.  What exists of the patient's chart was reviewed in depth and summarized above.  Clinical condition: Fair.        Laqueta Due [facility] Please page though AMION or Epic secure chat:  For password, contact charge nurse

## 2021-04-08 NOTE — ED Notes (Signed)
Spoke to [place], Charity fundraiser (2C) to inform that resp panel has been collected and will be run in the microlab at [facility]

## 2021-04-08 NOTE — ED Triage Notes (Signed)
Pt arrives pov with c/o hemoptysis this morning. Pt reports blood tinged mucus. Pt does report eating watermelon last note. Pt reports [facility] d/c from hosp, admit for "fluid on lungs" 2 weeks pta.

## 2021-04-08 NOTE — Plan of Care (Signed)
Initiated care plan Problem: Education: Goal: Knowledge of General Education information will improve Description: Including pain rating scale, medication(s)/side effects and non-pharmacologic comfort measures Outcome: Progressing   Problem: Health Behavior/Discharge Planning: Goal: Ability to manage health-related needs will improve Outcome: Progressing   Problem: Clinical Measurements: Goal: Ability to maintain clinical measurements within normal limits will improve Outcome: Progressing Goal: Will remain free from infection Outcome: Progressing Goal: Diagnostic test results will improve Outcome: Progressing Goal: Respiratory complications will improve Outcome: Progressing Goal: Cardiovascular complication will be avoided Outcome: Progressing   Problem: Activity: Goal: Risk for activity intolerance will decrease Outcome: Progressing   Problem: Nutrition: Goal: Adequate nutrition will be maintained Outcome: Progressing   Problem: Coping: Goal: Level of anxiety will decrease Outcome: Progressing   Problem: Elimination: Goal: Will not experience complications related to bowel motility Outcome: Progressing Goal: Will not experience complications related to urinary retention Outcome: Progressing   Problem: Pain Managment: Goal: General experience of comfort will improve Outcome: Progressing   Problem: Safety: Goal: Ability to remain free from injury will improve Outcome: Progressing   Problem: Skin Integrity: Goal: Risk for impaired skin integrity will decrease Outcome: Progressing   Problem: Education: Goal: Ability to demonstrate management of disease process will improve Outcome: Progressing Goal: Ability to verbalize understanding of medication therapies will improve Outcome: Progressing Goal: Individualized Educational Video(s) Outcome: Progressing   Problem: Activity: Goal: Capacity to carry out activities will improve Outcome: Progressing   Problem:  Cardiac: Goal: Ability to achieve and maintain adequate cardiopulmonary perfusion will improve Outcome: Progressing

## 2021-04-08 NOTE — ED Notes (Addendum)
Patient returned from CT

## 2021-04-09 ENCOUNTER — Observation Stay (HOSPITAL_COMMUNITY): Payer: Self-pay

## 2021-04-09 DIAGNOSIS — I2699 Other pulmonary embolism without acute cor pulmonale: Secondary | ICD-10-CM

## 2021-04-09 DIAGNOSIS — J9601 Acute respiratory failure with hypoxia: Secondary | ICD-10-CM

## 2021-04-09 DIAGNOSIS — I2693 Single subsegmental pulmonary embolism without acute cor pulmonale: Principal | ICD-10-CM

## 2021-04-09 DIAGNOSIS — I2609 Other pulmonary embolism with acute cor pulmonale: Secondary | ICD-10-CM

## 2021-04-09 LAB — COMPREHENSIVE METABOLIC PANEL
ALT: 27 U/L (ref 0–44)
AST: 24 U/L (ref 15–41)
Albumin: 3.5 g/dL (ref 3.5–5.0)
Alkaline Phosphatase: 69 U/L (ref 38–126)
Anion gap: 12 (ref 5–15)
BUN: 20 mg/dL (ref 6–20)
CO2: 18 mmol/L — ABNORMAL LOW (ref 22–32)
Calcium: 9.3 mg/dL (ref 8.9–10.3)
Chloride: 101 mmol/L (ref 98–111)
Creatinine, Ser: 1.46 mg/dL — ABNORMAL HIGH (ref 0.61–1.24)
GFR, Estimated: 56 mL/min — ABNORMAL LOW (ref >60)
Glucose, Bld: 132 mg/dL — ABNORMAL HIGH (ref 70–99)
Potassium: 4.7 mmol/L (ref 3.5–5.1)
Sodium: 131 mmol/L — ABNORMAL LOW (ref 135–145)
Total Bilirubin: 1.2 mg/dL (ref 0.3–1.2)
Total Protein: 7.9 g/dL (ref 6.5–8.1)

## 2021-04-09 LAB — ECHOCARDIOGRAM COMPLETE
Area-P 1/2: 3.37 cm2
Calc EF: 37.2 %
Height: 67 in
S' Lateral: 4.8 cm
Single Plane A2C EF: 38.8 %
Single Plane A4C EF: 37.1 %
Weight: 3562.63 oz

## 2021-04-09 LAB — CBC
HCT: 47.2 % (ref 39.0–52.0)
Hemoglobin: 15.3 g/dL (ref 13.0–17.0)
MCH: 26.5 pg (ref 26.0–34.0)
MCHC: 32.4 g/dL (ref 30.0–36.0)
MCV: 81.7 fL (ref 80.0–100.0)
Platelets: 388 10*3/uL (ref 150–400)
RBC: 5.78 MIL/uL (ref 4.22–5.81)
RDW: 15.3 % (ref 11.5–15.5)
WBC: 13 10*3/uL — ABNORMAL HIGH (ref 4.0–10.5)
nRBC: 0 % (ref 0.0–0.2)

## 2021-04-09 LAB — MAGNESIUM: Magnesium: 1.9 mg/dL (ref 1.7–2.4)

## 2021-04-09 LAB — PHOSPHORUS: Phosphorus: 5.5 mg/dL — ABNORMAL HIGH (ref 2.5–4.6)

## 2021-04-09 LAB — HEPARIN LEVEL (UNFRACTIONATED)
Heparin Unfractionated: 0.69 IU/mL (ref 0.30–0.70)
Heparin Unfractionated: 0.7 IU/mL (ref 0.30–0.70)

## 2021-04-09 MED ORDER — LIDOCAINE 5 % EX PTCH
1.0000 | MEDICATED_PATCH | CUTANEOUS | Status: DC
  Administered 2021-04-09 – 2021-04-13 (×5): 1 via TRANSDERMAL
  Filled 2021-04-09 (×7): qty 1

## 2021-04-09 MED ORDER — CARVEDILOL 6.25 MG PO TABS
6.2500 mg | ORAL_TABLET | Freq: Two times a day (BID) | ORAL | Status: DC
  Administered 2021-04-09 – 2021-04-14 (×10): 6.25 mg via ORAL
  Filled 2021-04-09 (×10): qty 1

## 2021-04-09 NOTE — Progress Notes (Signed)
BLE venous duplex has been completed.  Results can be found under chart review under CV PROC. 04/09/2021 3:41 PM Aaniya Sterba RVT, RDMS

## 2021-04-09 NOTE — Progress Notes (Addendum)
ANTICOAGULATION CONSULT NOTE  Pharmacy Consult for heparin Indication: pulmonary embolus  No Known Allergies  Patient Measurements: Height: 5\' 7"  (170.2 cm) Weight: 101 kg (222 lb 10.6 oz) IBW/kg (Calculated) : 66.1 Heparin Dosing Weight: 101 kg   Vital Signs: Temp: 97.5 F (36.4 C) (05/30 0739) Temp Source: Oral (05/30 0739) BP: 123/101 (05/30 0739) Pulse Rate: 94 (05/30 0346)  Labs: Recent Labs    04/08/21 1146 04/08/21 1359 04/08/21 2037 04/08/21 2135 04/09/21 0627  HGB 14.9  --   --   --  15.3  HCT 46.1  --   --   --  47.2  PLT 341  --   --   --  388  HEPARINUNFRC  --   --  >1.10* >1.10* 0.69  CREATININE 1.35*  --   --   --  1.46*  TROPONINIHS 18* 16  --   --   --     Estimated Creatinine Clearance: 64.8 mL/min (A) (by C-G formula based on SCr of 1.46 mg/dL (H)).   Medical History: Past Medical History:  Diagnosis Date  . Acute systolic CHF (congestive heart failure) (HCC) 03/26/2021  . Hyperlipidemia   . Hypertension   . Obese     Medications:  Medications Prior to Admission  Medication Sig Dispense Refill Last Dose  . acetaminophen (TYLENOL) 325 MG tablet Take 325-650 mg by mouth every 6 (six) hours as needed (for headaches).   unknown  . carvedilol (COREG) 6.25 MG tablet Take 1 tablet (6.25 mg total) by mouth 2 (two) times daily with a meal. 60 tablet 0 04/08/2021 at 9am  . dapagliflozin propanediol (FARXIGA) 10 MG TABS tablet Take 1 tablet (10 mg total) by mouth daily. 30 tablet 0 04/08/2021 at am  . furosemide (LASIX) 20 MG tablet Take 1 tablet (20 mg total) by mouth daily as needed for fluid or edema. 30 tablet 0 couple days ago  . rosuvastatin (CRESTOR) 40 MG tablet Take 1 tablet (40 mg total) by mouth daily. 30 tablet 0 04/08/2021 at am  . sacubitril-valsartan (ENTRESTO) 49-51 MG Take 1 tablet by mouth 2 (two) times daily. 60 tablet 0 04/08/2021 at am  . spironolactone (ALDACTONE) 25 MG tablet Take 1 tablet (25 mg total) by mouth daily. 30 tablet 0  04/08/2021 at am    Assessment: 55 YOM found to have an acute PE. Pharmacy consulted to start IV heparin. Experiencing persistent hemoptysis.   5/29pm: Heparin level undetectably high (>1.1) > re-drawn from opposite arm. Held for 1 hr and resumed 0030 5/30 at 1400 units/hr.  5/30 6/30: Heparin level therapeutic at 0.69 5/30 1248: Heparin level therapeutic at 0.7  Goal of Therapy:  Heparin level 0.3-0.7 units/ml Monitor platelets by anticoagulation protocol: Yes   Plan:  -Resume heparin infusion at 1400 units/hr -Daily heparin level -Monitor HL, CBC, and for s/sx of bleeding   6/30, PharmD, BCPS Emergency Medicine Clinical Pharmacist 04/09/2021 11:08 AM

## 2021-04-09 NOTE — Progress Notes (Signed)
PROGRESS NOTE  Andres Roman JSE:[id number] DOB: Jun 15, 1966 DOA: 04/08/2021 PCP: Gillian Scarce, MD   LOS: 0 days   Brief Narrative / Interim history: 55 year old male with history of HTN, HLD, prediabetes, chronic systolic CHF, chronic kidney disease stage IIIa comes into the hospital 5/29 with hemoptysis.  He was hospitalized and discharged about a week ago with new onset acute systolic CHF, got diuresed, cardiac cath showed nonobstructive CAD and discharged home.  He was doing well up until the morning of admission when he was having chest discomfort and started coughing up blood.  In the ER CT angiogram showed nonocclusive pulmonary embolus within the segmental artery in the right lower lobe with groundglass opacity in the right lower lobe likely pulmonary infarct.  He was hypoxic and admitted to the hospital  Subjective / 24h Interval events: Complains of right-sided chest pain this morning worse with deep breathing.  Mild shortness of breath with ambulation, still on oxygen this morning.  Has been coughing up blood throughout the night  Assessment & Plan: Principal Problem Acute hypoxic respiratory failure due to acute PE in the right lower lobe artery, with hemoptysis-patient remains hypoxic this morning requiring 2 L supplemental oxygen.  Due to persistent hemoptysis I will continue IV heparin for another day while closely monitoring.  Potential switch to oral anticoagulants tomorrow, patient prefers Eliquis.  A 2D echo to assess the RV has been ordered and pending.  Attempt to wean off oxygen as tolerated.  Lidocaine patch for his right-sided chest pain  Active Problems Chronic systolic CHF-2D echo done 2 weeks ago showed EF 25-30%, cardiac cath on 5/18 showed mild nonobstructive CAD.  Due to soft blood pressures on admission his home medications are on hold.  Blood pressure looks a little bit better this morning, resume Coreg.  Continue to hold furosemide, spironolactone,  Entresto  Hyponatremia -mild, monitor  Leukocytosis -possibly reactive  Elevated troponin -initial value was 18, repeat was negative.  Likely demand due to PE  Chronic kidney disease stage IIIa -Baseline creatinine 1.2-1.5, currently within his baseline  Hyperlipidemia -continue statin  Prediabetes -noted, A1c 6.5  Scheduled Meds: . lidocaine  1 patch Transdermal Q24H  . rosuvastatin  40 mg Oral Daily   Continuous Infusions: . sodium chloride 10 mL/hr at 04/08/21 1445  . heparin 1,400 Units/hr (04/09/21 0518)   PRN Meds:.sodium chloride, acetaminophen **OR** acetaminophen, albuterol, ondansetron **OR** ondansetron (ZOFRAN) IV, oxyCODONE  Diet Orders (From admission, onward)    Start     Ordered   04/08/21 1745  Diet Heart Room service appropriate? Yes; Fluid consistency: Thin  Diet effective now       Question Answer Comment  Room service appropriate? Yes   Fluid consistency: Thin      04/08/21 1747          DVT prophylaxis:      Code Status: Full Code  Family Communication: No family at bedside  Status is: Observation  The patient will require care spanning > 2 midnights and should be moved to inpatient because: Inpatient level of care appropriate due to severity of illness  Dispo: The patient is from: Home              Anticipated d/c is to: Home              Patient currently is not medically stable to d/c.   Difficult to place patient No  Level of care: Telemetry Cardiac  Consultants:  none  Procedures:  2D echo:  pending  Microbiology  none  Antimicrobials: none    Objective: Vitals:   04/08/21 1948 04/08/21 2300 04/09/21 0346 04/09/21 0739  BP: (!) 142/95 (!) 131/93 (!) 118/93 (!) 123/101  Pulse: 97 98 94   Resp: (!) 23 (!) 23 (!) 23 20  Temp: 98.8 F (37.1 C) 97.8 F (36.6 C) 97.6 F (36.4 C) (!) 97.5 F (36.4 C)  TempSrc: Oral Oral Oral Oral  SpO2: 90% 95% 94%   Weight:   101 kg   Height:        Intake/Output Summary (Last 24  hours) at 04/09/2021 0926 Last data filed at 04/08/2021 1442 Gross per 24 hour  Intake 0.48 ml  Output 450 ml  Net -449.52 ml   Filed Weights   04/08/21 1058 04/09/21 0346  Weight: 101.2 kg 101 kg    Examination:  Constitutional: NAD Eyes: no scleral icterus ENMT: Mucous membranes are moist.  Neck: normal, supple Respiratory: Slightly tachypneic, overall clear without wheezing, no significant crackles Cardiovascular: Regular rate and rhythm, no murmurs / rubs / gallops. No LE edema.  Abdomen: non distended, no tenderness. Bowel sounds positive.  Musculoskeletal: no clubbing / cyanosis.  Skin: no rashes Neurologic: CN 2-12 grossly intact. Strength 5/5 in all 4.   Data Reviewed: I have independently reviewed following labs and imaging studies   CBC: Recent Labs  Lab 04/08/21 1146 04/09/21 0627  WBC 10.2 13.0*  NEUTROABS 5.3  --   HGB 14.9 15.3  HCT 46.1 47.2  MCV 81.9 81.7  PLT 341 388   Basic Metabolic Panel: Recent Labs  Lab 04/03/21 0956 04/08/21 1146 04/09/21 0627  NA 134* 133* 131*  K 4.7 4.3 4.7  CL 104 104 101  CO2 22 21* 18*  GLUCOSE 94 107* 132*  BUN 29* 22* 20  CREATININE 1.58* 1.35* 1.46*  CALCIUM 9.1 9.1 9.3  MG  --   --  1.9  PHOS  --   --  5.5*   Liver Function Tests: Recent Labs  Lab 04/08/21 1146 04/09/21 0627  AST 32 24  ALT 30 27  ALKPHOS 66 69  BILITOT 0.7 1.2  PROT 7.7 7.9  ALBUMIN 3.5 3.5   Coagulation Profile: No results for input(s): INR, PROTIME in the last 168 hours. HbA1C: No results for input(s): HGBA1C in the last 72 hours. CBG: No results for input(s): GLUCAP in the last 168 hours.  Recent Results (from the past 240 hour(s))  Resp Panel by RT-PCR (Flu A&B, Covid) Nasopharyngeal Swab     Status: None   Collection Time: 04/08/21  1:59 PM   Specimen: Nasopharyngeal Swab; Nasopharyngeal(NP) swabs in vial transport medium  Result Value Ref Range Status   SARS Coronavirus 2 by RT PCR NEGATIVE NEGATIVE Final     Comment: (NOTE) SARS-CoV-2 target nucleic acids are NOT DETECTED.  The SARS-CoV-2 RNA is generally detectable in upper respiratory specimens during the acute phase of infection. The lowest concentration of SARS-CoV-2 viral copies this assay can detect is 138 copies/mL. A negative result does not preclude SARS-Cov-2 infection and should not be used as the sole basis for treatment or other patient management decisions. A negative result may occur with  improper specimen collection/handling, submission of specimen other than nasopharyngeal swab, presence of viral mutation(s) within the areas targeted by this assay, and inadequate number of viral copies(<138 copies/mL). A negative result must be combined with clinical observations, patient history, and epidemiological information. The expected result is Negative.  Fact Sheet for Patients:  BloggerCourse.com  Fact Sheet for Healthcare Providers:  SeriousBroker.it  This test is no t yet approved or cleared by the [place] FDA and  has been authorized for detection and/or diagnosis of SARS-CoV-2 by FDA under an Emergency Use Authorization (EUA). This EUA will remain  in effect (meaning this test can be used) for the duration of the COVID-19 declaration under Section 564(b)(1) of the Act, 21 U.S.C.section 360bbb-3(b)(1), unless the authorization is terminated  or revoked sooner.       Influenza A by PCR NEGATIVE NEGATIVE Final   Influenza B by PCR NEGATIVE NEGATIVE Final    Comment: (NOTE) The Xpert Xpress SARS-CoV-2/FLU/RSV plus assay is intended as an aid in the diagnosis of influenza from Nasopharyngeal swab specimens and should not be used as a sole basis for treatment. Nasal washings and aspirates are unacceptable for Xpert Xpress SARS-CoV-2/FLU/RSV testing.  Fact Sheet for Patients: BloggerCourse.com  Fact Sheet for Healthcare  Providers: SeriousBroker.it  This test is not yet approved or cleared by the [place] FDA and has been authorized for detection and/or diagnosis of SARS-CoV-2 by FDA under an Emergency Use Authorization (EUA). This EUA will remain in effect (meaning this test can be used) for the duration of the COVID-19 declaration under Section 564(b)(1) of the Act, 21 U.S.C. section 360bbb-3(b)(1), unless the authorization is terminated or revoked.  Performed at [facility], [address], [place], [place] [address]      Radiology Studies: CT ANGIO CHEST PE W OR WO CONTRAST  Addendum Date: 04/08/2021   ADDENDUM REPORT: 04/08/2021 13:32 ADDENDUM: These results were called by telephone at the time of interpretation on 04/08/2021 at 1:32 pm to provider Dr. Dalene Seltzer, who verbally acknowledged these results. Electronically Signed   By: Ted Mcalpine M.D.   On: 04/08/2021 13:32   Result Date: 04/08/2021 CLINICAL DATA:  PE suspected. EXAM: CT ANGIOGRAPHY CHEST WITH CONTRAST TECHNIQUE: Multidetector CT imaging of the chest was performed using the standard protocol during bolus administration of intravenous contrast. Multiplanar CT image reconstructions and MIPs were obtained to evaluate the vascular anatomy. CONTRAST:  OMNIPAQUE IOHEXOL 350 MG/ML SOLN COMPARISON:  None. FINDINGS: Cardiovascular: Satisfactory opacification of the pulmonary arteries to the segmental level. There is an occlusive pulmonary embolus within a segmental artery in the right lower lobe. Normal heart size. No pericardial effusion. Mediastinum/Nodes: Mildly enlarged periesophageal lymph nodes. Esophagus, trachea and the thyroid gland look normal. Lungs/Pleura: Geometric area of ground-glass opacity peripherally in the superior segment of the right lower lobe in the distribution of the pulmonary embolus. Upper Abdomen: No acute abnormality. Musculoskeletal: No chest wall abnormality. No acute or  significant osseous findings. Review of the MIP images confirms the above findings. IMPRESSION: 1. Occlusive pulmonary embolus within a segmental artery in the right lower lobe. 2. Geometric area of ground-glass opacity peripherally in the superior segment of the right lower lobe in the distribution of the pulmonary embolus. This may represent an area of pulmonary infarct. 3. Mildly enlarged periesophageal lymph nodes, nonspecific. Electronically Signed: By: Ted Mcalpine M.D. On: 04/08/2021 13:27   DG Chest Port 1 View  Result Date: 04/08/2021 CLINICAL DATA:  55 year old male with clinical concern for potential fluid overload. EXAM: PORTABLE CHEST 1 VIEW COMPARISON:  Chest x-ray 03/25/2021. FINDINGS: Opacity in the periphery of the right lung base obscuring the lateral aspect of the right hemidiaphragm. Left lung is clear. No definite pleural effusions. No evidence of pulmonary edema. No pneumothorax. Heart size is normal. Upper mediastinal  contours are within normal limits. Aortic atherosclerosis. IMPRESSION: 1. Ill-defined opacity in the periphery of the right lung base concerning for pneumonia. Followup PA and lateral chest X-ray is recommended in 3-4 weeks following trial of antibiotic therapy to ensure resolution and exclude underlying malignancy. 2. Aortic atherosclerosis. 3. No findings to suggest pulmonary edema or fluid overload. Electronically Signed   By: Trudie Reed M.D.   On: 04/08/2021 12:32    Pamella Pert, MD, PhD [facility]  Between 7 am - 7 pm I am available, please contact me via Amion (for emergencies) or Securechat (non urgent messages)  Between 7 pm - 7 am I am not available, please contact night coverage MD/APP via Amion

## 2021-04-09 NOTE — Plan of Care (Signed)

## 2021-04-09 NOTE — Plan of Care (Signed)
  Problem: Education: Goal: Knowledge of General Education information will improve Description: Including pain rating scale, medication(s)/side effects and non-pharmacologic comfort measures Outcome: Progressing   Problem: Health Behavior/Discharge Planning: Goal: Ability to manage health-related needs will improve Outcome: Progressing   Problem: Activity: Goal: Risk for activity intolerance will decrease Outcome: Progressing   Problem: Nutrition: Goal: Adequate nutrition will be maintained Outcome: Progressing   Problem: Elimination: Goal: Will not experience complications related to bowel motility Outcome: Progressing Goal: Will not experience complications related to urinary retention Outcome: Progressing   Problem: Pain Managment: Goal: General experience of comfort will improve Outcome: Progressing   Problem: Safety: Goal: Ability to remain free from injury will improve Outcome: Progressing   

## 2021-04-09 NOTE — Progress Notes (Signed)
  Echocardiogram 2D Echocardiogram has been performed.  Augustine Radar 04/09/2021, 9:43 AM

## 2021-04-10 ENCOUNTER — Ambulatory Visit: Payer: Self-pay | Admitting: Nurse Practitioner

## 2021-04-10 ENCOUNTER — Other Ambulatory Visit (HOSPITAL_COMMUNITY): Payer: Self-pay

## 2021-04-10 LAB — HEPARIN LEVEL (UNFRACTIONATED): Heparin Unfractionated: 0.39 IU/mL (ref 0.30–0.70)

## 2021-04-10 LAB — CBC
HCT: 43.4 % (ref 39.0–52.0)
Hemoglobin: 14 g/dL (ref 13.0–17.0)
MCH: 26.6 pg (ref 26.0–34.0)
MCHC: 32.3 g/dL (ref 30.0–36.0)
MCV: 82.5 fL (ref 80.0–100.0)
Platelets: 331 10*3/uL (ref 150–400)
RBC: 5.26 MIL/uL (ref 4.22–5.81)
RDW: 15.2 % (ref 11.5–15.5)
WBC: 14.7 10*3/uL — ABNORMAL HIGH (ref 4.0–10.5)
nRBC: 0 % (ref 0.0–0.2)

## 2021-04-10 LAB — BASIC METABOLIC PANEL
Anion gap: 8 (ref 5–15)
BUN: 18 mg/dL (ref 6–20)
CO2: 22 mmol/L (ref 22–32)
Calcium: 8.9 mg/dL (ref 8.9–10.3)
Chloride: 101 mmol/L (ref 98–111)
Creatinine, Ser: 1.33 mg/dL — ABNORMAL HIGH (ref 0.61–1.24)
GFR, Estimated: >60 mL/min (ref >60)
Glucose, Bld: 116 mg/dL — ABNORMAL HIGH (ref 70–99)
Potassium: 4.5 mmol/L (ref 3.5–5.1)
Sodium: 131 mmol/L — ABNORMAL LOW (ref 135–145)

## 2021-04-10 MED ORDER — TRANEXAMIC ACID FOR INHALATION
500.0000 mg | Freq: Three times a day (TID) | RESPIRATORY_TRACT | Status: AC
  Administered 2021-04-10 – 2021-04-11 (×3): 500 mg via RESPIRATORY_TRACT
  Filled 2021-04-10 (×3): qty 10

## 2021-04-10 MED ORDER — TRANEXAMIC ACID FOR INHALATION: 500.0000 mg | Freq: Three times a day (TID) | RESPIRATORY_TRACT | Status: DC

## 2021-04-10 MED ORDER — SENNOSIDES-DOCUSATE SODIUM 8.6-50 MG PO TABS
2.0000 | ORAL_TABLET | Freq: Two times a day (BID) | ORAL | Status: DC
  Administered 2021-04-10 – 2021-04-14 (×9): 2 via ORAL
  Filled 2021-04-10 (×9): qty 2

## 2021-04-10 NOTE — TOC Initial Note (Signed)
Transition of Care [facility]) - Initial/Assessment Note    Patient Details  Name: Andres Roman MRN: [id number] Date of Birth: August 26, 1966  Transition of Care [facility]) CM/SW Contact:    Kermit Balo, RN Phone Number: 04/10/2021, 3:47 PM  Clinical Narrative:                 Currently patient lives alone but is planning to move in with his sister shortly after d/c. Pt had an appointment with [facility] but was in the hospital. CM called to reschedule but they are unable to see him until July 11th. (information on AVS). CM also asked that he be added to the call list for a closer appt. Pt will need refills on meds at d/c to last until his July appt. Pt will need 30 day Eliquis card at d/c. CM provided him patient assistance for for the Eliquis as he has no insurance. This will need to be faxed in with prescription prior to d/c home. No DME at home.  TOC following.    Expected Discharge Plan: Home/Self Care Barriers to Discharge: Continued Medical Work up   Patient Goals and CMS Choice        Expected Discharge Plan and Services Expected Discharge Plan: Home/Self Care   Discharge Planning Services: CM Consult   Living arrangements for the past 2 months: Apartment                                      Prior Living Arrangements/Services Living arrangements for the past 2 months: Apartment Lives with:: Self Patient language and need for interpreter reviewed:: Yes Do you feel safe going back to the place where you live?: Yes            Criminal Activity/Legal Involvement Pertinent to Current Situation/Hospitalization: No - Comment as needed  Activities of Daily Living Home Assistive Devices/Equipment: None ADL Screening (condition at time of admission) Patient's cognitive ability adequate to safely complete daily activities?: Yes Is the patient deaf or have difficulty hearing?: No Does the patient have difficulty seeing, even when wearing glasses/contacts?: No Does the patient  have difficulty concentrating, remembering, or making decisions?: No Patient able to express need for assistance with ADLs?: No Does the patient have difficulty dressing or bathing?: No Independently performs ADLs?: Yes (appropriate for developmental age) Does the patient have difficulty walking or climbing stairs?: No Weakness of Legs: None Weakness of Arms/Hands: None  Permission Sought/Granted                  Emotional Assessment Appearance:: Appears stated age Attitude/Demeanor/Rapport: Engaged Affect (typically observed): Accepting Orientation: : Oriented to Self,Oriented to Place,Oriented to  Time,Oriented to Situation   Psych Involvement: No (comment)  Admission diagnosis:  Pulmonary infarct (HCC) [I26.99] Pulmonary embolism (HCC) [I26.99] Single subsegmental pulmonary embolism without acute cor pulmonale (HCC) [I26.93] Acute pulmonary embolism (HCC) [I26.99] Patient Active Problem List   Diagnosis Date Noted  . Acute pulmonary embolism (HCC) 04/09/2021  . Pulmonary embolism (HCC) 04/08/2021  . Hemoptysis 04/08/2021  . Chronic systolic CHF (congestive heart failure) (HCC) 04/08/2021  . CKD (chronic kidney disease) stage 3, GFR 30-59 ml/min (HCC) 03/29/2021  . Prediabetes 03/29/2021  . NICM (nonischemic cardiomyopathy) (HCC) 03/29/2021  . Acute systolic heart failure (HCC)   . Microalbuminuria 04/09/2014  . Allergic rhinitis 04/09/2014  . Essential hypertension, benign 01/30/2014  . HLD (hyperlipidemia) 01/30/2014  . Other malaise and  fatigue 01/30/2014  . Unspecified vitamin D deficiency 01/30/2014   PCP:  Gillian Scarce, MD Pharmacy:   [facility] [address] [place], [place] - [address] [place] [address] Phone: [phone] Fax: [phone]  [facility] [address] [place] [address] Phone: [phone] Fax: [phone]     Social Determinants of Health (SDOH)  Interventions    Readmission Risk Interventions No flowsheet data found.

## 2021-04-10 NOTE — Plan of Care (Signed)

## 2021-04-10 NOTE — Progress Notes (Addendum)
ANTICOAGULATION CONSULT NOTE  Pharmacy Consult for heparin Indication: pulmonary embolus  No Known Allergies  Patient Measurements: Height: 5\' 7"  (170.2 cm) Weight: 101.6 kg (223 lb 15.8 oz) IBW/kg (Calculated) : 66.1 Heparin Dosing Weight: 101 kg   Vital Signs: Temp: 98.3 F (36.8 C) (05/31 0746) Temp Source: Oral (05/31 0746) BP: 131/89 (05/31 0746) Pulse Rate: 100 (05/31 0300)  Labs: Recent Labs    04/08/21 1146 04/08/21 1359 04/08/21 2037 04/09/21 0627 04/09/21 1248 04/10/21 0121  HGB 14.9  --   --  15.3  --  14.0  HCT 46.1  --   --  47.2  --  43.4  PLT 341  --   --  388  --  331  HEPARINUNFRC  --   --    < > 0.69 0.70 0.39  CREATININE 1.35*  --   --  1.46*  --  1.33*  TROPONINIHS 18* 16  --   --   --   --    < > = values in this interval not displayed.    Estimated Creatinine Clearance: 71.3 mL/min (A) (by C-G formula based on SCr of 1.33 mg/dL (H)).   Medical History: Past Medical History:  Diagnosis Date  . Acute systolic CHF (congestive heart failure) (HCC) 03/26/2021  . Hyperlipidemia   . Hypertension   . Obese     Medications:  Medications Prior to Admission  Medication Sig Dispense Refill Last Dose  . acetaminophen (TYLENOL) 325 MG tablet Take 325-650 mg by mouth every 6 (six) hours as needed (for headaches).   unknown  . carvedilol (COREG) 6.25 MG tablet Take 1 tablet (6.25 mg total) by mouth 2 (two) times daily with a meal. 60 tablet 0 04/08/2021 at 9am  . dapagliflozin propanediol (FARXIGA) 10 MG TABS tablet Take 1 tablet (10 mg total) by mouth daily. 30 tablet 0 04/08/2021 at am  . furosemide (LASIX) 20 MG tablet Take 1 tablet (20 mg total) by mouth daily as needed for fluid or edema. 30 tablet 0 couple days ago  . rosuvastatin (CRESTOR) 40 MG tablet Take 1 tablet (40 mg total) by mouth daily. 30 tablet 0 04/08/2021 at am  . sacubitril-valsartan (ENTRESTO) 49-51 MG Take 1 tablet by mouth 2 (two) times daily. 60 tablet 0 04/08/2021 at am  .  spironolactone (ALDACTONE) 25 MG tablet Take 1 tablet (25 mg total) by mouth daily. 30 tablet 0 04/08/2021 at am    Assessment: 55 YOM found to have an acute PE. Pharmacy consulted to dose IV heparin. He is noted with hemoptysis and on transexamic acid nebs (started 5/31). Plans are noted for apixaban when able to change to oral anticoagulation.  -heparin level at goal -hg= 14 and stable  Goal of Therapy:  Heparin level= 0.3-0.5 Monitor platelets by anticoagulation protocol: Yes   Plan:  -Continue heparin at 1400 units/hr -Will adjust heparin goal to 0.3-0.5 -Daily heparin level and CBC  6/31, PharmD Clinical Pharmacist **Pharmacist phone directory can now be found on [email or web address] ([email or web address]).  Listed under [facility].

## 2021-04-10 NOTE — Progress Notes (Signed)
PROGRESS NOTE  Montgomery Favor ZOX:[id number] DOB: Mar 21, 1966 DOA: 04/08/2021 PCP: Gillian Scarce, MD   LOS: 1 day   Brief Narrative / Interim history: 55 year old male with history of HTN, HLD, prediabetes, chronic systolic CHF, chronic kidney disease stage IIIa comes into the hospital 5/29 with hemoptysis.  He was hospitalized and discharged about a week ago with new onset acute systolic CHF, got diuresed, cardiac cath showed nonobstructive CAD and discharged home.  He was doing well up until the morning of admission when he was having chest discomfort and started coughing up blood.  In the ER CT angiogram showed nonocclusive pulmonary embolus within the segmental artery in the right lower lobe with groundglass opacity in the right lower lobe likely pulmonary infarct.  He was hypoxic and admitted to the hospital  Subjective / 24h Interval events: Still complains of chest pain on the right side with a deep breath.  Does not feel particularly short of breath.  Continues to have hemoptysis, about 200 cc in the emesis bag next to him.  Satting 88% on 2 L when I was in the room talking to him  Assessment & Plan: Principal Problem Acute hypoxic respiratory failure due to acute PE in the right lower lobe artery, with hemoptysis-patient remains hypoxic this morning requiring 2 L supplemental oxygen.  He is satting 88% while talking to me. -Unfortunately continues to have significant hemoptysis.  Case discussed with Dr. Katrinka Blazing with PCCM who recommends tranexamic acid nebulizers for now with close inpatient monitoring.  Risks and benefits of oral anticoagulation have been discussed with the patient and will place on Eliquis when his hemoptysis is improving -2D echo done 5/30 showed normal RV  Active Problems Chronic systolic CHF-2D echo done 2 weeks ago showed EF 25-30%, cardiac cath on 5/18 showed mild nonobstructive CAD.  Due to soft blood pressures on admission his home medications are on hold except for  Coreg.  Continue to hold furosemide, spironolactone, Entresto, he appears euvolemic  Hyponatremia -mild, monitor  Leukocytosis -possibly reactive  Elevated troponin -initial value was 18, repeat was negative.  Likely demand due to PE  Chronic kidney disease stage IIIa -Baseline creatinine 1.2-1.5, currently within his baseline  Hyperlipidemia -continue statin  Prediabetes -noted, A1c 6.1  Scheduled Meds: . carvedilol  6.25 mg Oral BID WC  . lidocaine  1 patch Transdermal Q24H  . rosuvastatin  40 mg Oral Daily  . tranexamic acid  500 mg Nebulization Q8H   Continuous Infusions: . sodium chloride Stopped (04/08/21 1447)  . heparin 1,400 Units/hr (04/10/21 0700)   PRN Meds:.sodium chloride, acetaminophen **OR** acetaminophen, albuterol, ondansetron **OR** ondansetron (ZOFRAN) IV, oxyCODONE  Diet Orders (From admission, onward)    Start     Ordered   04/08/21 1745  Diet Heart Room service appropriate? Yes; Fluid consistency: Thin  Diet effective now       Question Answer Comment  Room service appropriate? Yes   Fluid consistency: Thin      04/08/21 1747          DVT prophylaxis:      Code Status: Full Code  Family Communication: No family at bedside  Status is: Inpatient   Inpatient level of care appropriate due to severity of illness  Dispo: The patient is from: Home              Anticipated d/c is to: Home              Patient currently is not medically stable to  d/c.   Difficult to place patient No  Level of care: Telemetry Cardiac  Consultants:  none  Procedures:  2D echo  Microbiology  none  Antimicrobials: none    Objective: Vitals:   04/09/21 1957 04/09/21 2300 04/10/21 0300 04/10/21 0746  BP: (!) 131/95 119/86 (!) 127/91 131/89  Pulse: 93 94 100   Resp: (!) 24 (!) 21 (!) 23 20  Temp: 98.3 F (36.8 C) 98 F (36.7 C) 98 F (36.7 C) 98.3 F (36.8 C)  TempSrc: Oral Oral Oral Oral  SpO2: 93% 91% 95%   Weight:   101.6 kg   Height:         Intake/Output Summary (Last 24 hours) at 04/10/2021 0914 Last data filed at 04/10/2021 0700 Gross per 24 hour  Intake 336.06 ml  Output 500 ml  Net -163.94 ml   Filed Weights   04/08/21 1058 04/09/21 0346 04/10/21 0300  Weight: 101.2 kg 101 kg 101.6 kg    Examination:  Constitutional: No distress Eyes: No icterus ENMT: mmm Neck: normal, supple Respiratory: Tachypneic, faint rhonchi at the left base, no wheezing Cardiovascular: Regular rate and rhythm, no murmurs Abdomen: Soft, NT, ND, bowel sounds positive Musculoskeletal: no clubbing / cyanosis.  Skin: No rashes Neurologic: Nonfocal  Data Reviewed: I have independently reviewed following labs and imaging studies   CBC: Recent Labs  Lab 04/08/21 1146 04/09/21 0627 04/10/21 0121  WBC 10.2 13.0* 14.7*  NEUTROABS 5.3  --   --   HGB 14.9 15.3 14.0  HCT 46.1 47.2 43.4  MCV 81.9 81.7 82.5  PLT 341 388 331   Basic Metabolic Panel: Recent Labs  Lab 04/03/21 0956 04/08/21 1146 04/09/21 0627 04/10/21 0121  NA 134* 133* 131* 131*  K 4.7 4.3 4.7 4.5  CL 104 104 101 101  CO2 22 21* 18* 22  GLUCOSE 94 107* 132* 116*  BUN 29* 22* 20 18  CREATININE 1.58* 1.35* 1.46* 1.33*  CALCIUM 9.1 9.1 9.3 8.9  MG  --   --  1.9  --   PHOS  --   --  5.5*  --    Liver Function Tests: Recent Labs  Lab 04/08/21 1146 04/09/21 0627  AST 32 24  ALT 30 27  ALKPHOS 66 69  BILITOT 0.7 1.2  PROT 7.7 7.9  ALBUMIN 3.5 3.5   Coagulation Profile: No results for input(s): INR, PROTIME in the last 168 hours. HbA1C: No results for input(s): HGBA1C in the last 72 hours. CBG: No results for input(s): GLUCAP in the last 168 hours.  Recent Results (from the past 240 hour(s))  Resp Panel by RT-PCR (Flu A&B, Covid) Nasopharyngeal Swab     Status: None   Collection Time: 04/08/21  1:59 PM   Specimen: Nasopharyngeal Swab; Nasopharyngeal(NP) swabs in vial transport medium  Result Value Ref Range Status   SARS Coronavirus 2 by RT PCR  NEGATIVE NEGATIVE Final    Comment: (NOTE) SARS-CoV-2 target nucleic acids are NOT DETECTED.  The SARS-CoV-2 RNA is generally detectable in upper respiratory specimens during the acute phase of infection. The lowest concentration of SARS-CoV-2 viral copies this assay can detect is 138 copies/mL. A negative result does not preclude SARS-Cov-2 infection and should not be used as the sole basis for treatment or other patient management decisions. A negative result may occur with  improper specimen collection/handling, submission of specimen other than nasopharyngeal swab, presence of viral mutation(s) within the areas targeted by this assay, and inadequate number of viral copies(<138  copies/mL). A negative result must be combined with clinical observations, patient history, and epidemiological information. The expected result is Negative.  Fact Sheet for Patients:  BloggerCourse.com  Fact Sheet for Healthcare Providers:  SeriousBroker.it  This test is no t yet approved or cleared by the [place] FDA and  has been authorized for detection and/or diagnosis of SARS-CoV-2 by FDA under an Emergency Use Authorization (EUA). This EUA will remain  in effect (meaning this test can be used) for the duration of the COVID-19 declaration under Section 564(b)(1) of the Act, 21 U.S.C.section 360bbb-3(b)(1), unless the authorization is terminated  or revoked sooner.       Influenza A by PCR NEGATIVE NEGATIVE Final   Influenza B by PCR NEGATIVE NEGATIVE Final    Comment: (NOTE) The Xpert Xpress SARS-CoV-2/FLU/RSV plus assay is intended as an aid in the diagnosis of influenza from Nasopharyngeal swab specimens and should not be used as a sole basis for treatment. Nasal washings and aspirates are unacceptable for Xpert Xpress SARS-CoV-2/FLU/RSV testing.  Fact Sheet for Patients: BloggerCourse.com  Fact Sheet for  Healthcare Providers: SeriousBroker.it  This test is not yet approved or cleared by the [place] FDA and has been authorized for detection and/or diagnosis of SARS-CoV-2 by FDA under an Emergency Use Authorization (EUA). This EUA will remain in effect (meaning this test can be used) for the duration of the COVID-19 declaration under Section 564(b)(1) of the Act, 21 U.S.C. section 360bbb-3(b)(1), unless the authorization is terminated or revoked.  Performed at [facility], [address] [place], [place] [address]      Radiology Studies: ECHOCARDIOGRAM COMPLETE  Result Date: 04/09/2021    ECHOCARDIOGRAM REPORT   Patient Name:   Amr Monger Date of Exam: 04/09/2021 Medical Rec #:  [id number]     Height:       67.0 in Accession #:    [id number]    Weight:       222.7 lb Date of Birth:  16-Apr-1966     BSA:          2.117 m Patient Age:    55 years      BP:           118/93 mmHg Patient Gender: M             HR:           94 bpm. Exam Location:  Inpatient Procedure: 2D Echo, Cardiac Doppler and Color Doppler Indications:    Pulmonary Embolus I26.09  History:        Patient has prior history of Echocardiogram examinations, most                 recent 03/28/2021. CHF; Risk Factors:Hypertension and                 Dyslipidemia.  Sonographer:    Eulah Pont RDCS Referring Phys: [id number] Kieth Brightly MACNEIL IMPRESSIONS  1. MR appears to be mild on present study compared to previous though not well interrogated.  2. Left ventricular ejection fraction, by estimation, is 25 to 30%. The left ventricle has severely decreased function. The left ventricle demonstrates global hypokinesis. The left ventricular internal cavity size was moderately dilated. Left ventricular diastolic parameters are consistent with Grade II diastolic dysfunction (pseudonormalization).  3. Right ventricular systolic function is normal. The right ventricular size is normal.  4. The mitral valve is  normal in structure. Mild mitral valve regurgitation. No evidence of mitral stenosis.  5.  The aortic valve is tricuspid. Aortic valve regurgitation is mild. No aortic stenosis is present.  6. The inferior vena cava is normal in size with greater than 50% respiratory variability, suggesting right atrial pressure of 3 mmHg. FINDINGS  Left Ventricle: Left ventricular ejection fraction, by estimation, is 25 to 30%. The left ventricle has severely decreased function. The left ventricle demonstrates global hypokinesis. The left ventricular internal cavity size was moderately dilated. There is no left ventricular hypertrophy. Left ventricular diastolic parameters are consistent with Grade II diastolic dysfunction (pseudonormalization). Right Ventricle: The right ventricular size is normal. Right ventricular systolic function is normal. Left Atrium: Left atrial size was normal in size. Right Atrium: Right atrial size was normal in size. Pericardium: There is no evidence of pericardial effusion. Mitral Valve: The mitral valve is normal in structure. Mild mitral valve regurgitation. No evidence of mitral valve stenosis. Tricuspid Valve: The tricuspid valve is normal in structure. Tricuspid valve regurgitation is trivial. No evidence of tricuspid stenosis. Aortic Valve: The aortic valve is tricuspid. Aortic valve regurgitation is mild. No aortic stenosis is present. Pulmonic Valve: The pulmonic valve was normal in structure. Pulmonic valve regurgitation is not visualized. No evidence of pulmonic stenosis. Aorta: The aortic root is normal in size and structure. Venous: The inferior vena cava is normal in size with greater than 50% respiratory variability, suggesting right atrial pressure of 3 mmHg. IAS/Shunts: No atrial level shunt detected by color flow Doppler. Additional Comments: MR appears to be mild on present study compared to previous though not well interrogated.  LEFT VENTRICLE PLAX 2D LVIDd:         5.90 cm       Diastology LVIDs:         4.80 cm      LV e' medial:    6.83 cm/s LV PW:         1.40 cm      LV E/e' medial:  6.7 LV IVS:        1.10 cm      LV e' lateral:   6.12 cm/s LVOT diam:     2.40 cm      LV E/e' lateral: 7.5 LV SV:         58 LV SV Index:   28 LVOT Area:     4.52 cm  LV Volumes (MOD) LV vol d, MOD A2C: 170.0 ml LV vol d, MOD A4C: 132.0 ml LV vol s, MOD A2C: 104.0 ml LV vol s, MOD A4C: 83.0 ml LV SV MOD A2C:     66.0 ml LV SV MOD A4C:     132.0 ml LV SV MOD BP:      56.0 ml RIGHT VENTRICLE RV S prime:     10.60 cm/s TAPSE (M-mode): 2.2 cm LEFT ATRIUM             Index       RIGHT ATRIUM           Index LA diam:        4.40 cm 2.08 cm/m  RA Area:     12.20 cm LA Vol (A2C):   61.9 ml 29.24 ml/m RA Volume:   24.80 ml  11.72 ml/m LA Vol (A4C):   55.5 ml 26.22 ml/m LA Biplane Vol: 63.5 ml 30.00 ml/m  AORTIC VALVE LVOT Vmax:   85.90 cm/s LVOT Vmean:  54.100 cm/s LVOT VTI:    0.129 m  AORTA Ao Root diam: 3.50 cm Ao Asc diam:  3.40 cm MITRAL VALVE MV Area (PHT): 3.37 cm    SHUNTS MV Decel Time: 225 msec    Systemic VTI:  0.13 m MV E velocity: 45.70 cm/s  Systemic Diam: 2.40 cm MV A velocity: 59.10 cm/s MV E/A ratio:  0.77 Olga Millers MD Electronically signed by Olga Millers MD Signature Date/Time: 04/09/2021/9:56:16 AM    Final    VAS [place] LOWER EXTREMITY VENOUS (DVT)  Result Date: 04/09/2021  Lower Venous DVT Study Patient Name:  SESAR MADEWELL  Date of Exam:   04/09/2021 Medical Rec #: [id number]      Accession #:    [id number] Date of Birth: 1966/06/09      Patient Gender: M Patient Age:   55Y Exam Location:  [facility] Procedure:      VAS [place] LOWER EXTREMITY VENOUS (DVT) Referring Phys: [id number] Kieth Brightly MACNEIL --------------------------------------------------------------------------------  Indications: Pulmonary embolism.  Comparison Study: No previous exams. Performing Technologist: Ernestene Mention  Examination Guidelines: A complete evaluation includes B-mode imaging, spectral Doppler, color  Doppler, and power Doppler as needed of all accessible portions of each vessel. Bilateral testing is considered an integral part of a complete examination. Limited examinations for reoccurring indications may be performed as noted. The reflux portion of the exam is performed with the patient in reverse Trendelenburg.  +---------+---------------+---------+-----------+----------+--------------+ RIGHT    CompressibilityPhasicitySpontaneityPropertiesThrombus Aging +---------+---------------+---------+-----------+----------+--------------+ CFV      Full           Yes      Yes                                 +---------+---------------+---------+-----------+----------+--------------+ SFJ      Full                                                        +---------+---------------+---------+-----------+----------+--------------+ FV Prox  Full           Yes      Yes                                 +---------+---------------+---------+-----------+----------+--------------+ FV Mid   Full           Yes      Yes                                 +---------+---------------+---------+-----------+----------+--------------+ FV DistalFull           Yes      Yes                                 +---------+---------------+---------+-----------+----------+--------------+ PFV      Full                                                        +---------+---------------+---------+-----------+----------+--------------+ POP      Full           Yes  Yes                                 +---------+---------------+---------+-----------+----------+--------------+ PTV      Full                                                        +---------+---------------+---------+-----------+----------+--------------+ PERO     Full                                                        +---------+---------------+---------+-----------+----------+--------------+    +---------+---------------+---------+-----------+----------+-------------------+ LEFT     CompressibilityPhasicitySpontaneityPropertiesThrombus Aging      +---------+---------------+---------+-----------+----------+-------------------+ CFV      Full           Yes      Yes                                      +---------+---------------+---------+-----------+----------+-------------------+ SFJ      Full                                                             +---------+---------------+---------+-----------+----------+-------------------+ FV Prox  Full           Yes      Yes                                      +---------+---------------+---------+-----------+----------+-------------------+ FV Mid   Full           Yes      Yes                                      +---------+---------------+---------+-----------+----------+-------------------+ FV DistalFull           Yes      Yes                                      +---------+---------------+---------+-----------+----------+-------------------+ PFV      Full                                                             +---------+---------------+---------+-----------+----------+-------------------+ POP      Full           Yes      Yes                                      +---------+---------------+---------+-----------+----------+-------------------+  PTV      Full                                         Not well visualized +---------+---------------+---------+-----------+----------+-------------------+ PERO     Full                                         Not well visualized +---------+---------------+---------+-----------+----------+-------------------+     *See table(s) above for measurements and observations.    Preliminary     Pamella Pertostin Shamina Etheridge, MD, PhD [facility]  Between 7 am - 7 pm I am available, please contact me via Amion (for emergencies) or Securechat (non urgent  messages)  Between 7 pm - 7 am I am not available, please contact night coverage MD/APP via Amion

## 2021-04-11 ENCOUNTER — Inpatient Hospital Stay (HOSPITAL_COMMUNITY): Payer: Self-pay

## 2021-04-11 DIAGNOSIS — R1319 Other dysphagia: Secondary | ICD-10-CM

## 2021-04-11 DIAGNOSIS — I5022 Chronic systolic (congestive) heart failure: Secondary | ICD-10-CM

## 2021-04-11 DIAGNOSIS — I2699 Other pulmonary embolism without acute cor pulmonale: Secondary | ICD-10-CM

## 2021-04-11 DIAGNOSIS — R042 Hemoptysis: Secondary | ICD-10-CM

## 2021-04-11 LAB — BASIC METABOLIC PANEL WITH GFR
Anion gap: 11 (ref 5–15)
BUN: 17 mg/dL (ref 6–20)
CO2: 21 mmol/L — ABNORMAL LOW (ref 22–32)
Calcium: 9 mg/dL (ref 8.9–10.3)
Chloride: 101 mmol/L (ref 98–111)
Creatinine, Ser: 1.29 mg/dL — ABNORMAL HIGH (ref 0.61–1.24)
GFR, Estimated: >60 mL/min
Glucose, Bld: 101 mg/dL — ABNORMAL HIGH (ref 70–99)
Potassium: 4.3 mmol/L (ref 3.5–5.1)
Sodium: 133 mmol/L — ABNORMAL LOW (ref 135–145)

## 2021-04-11 LAB — HEPARIN LEVEL (UNFRACTIONATED)
Heparin Unfractionated: 0.24 [IU]/mL — ABNORMAL LOW (ref 0.30–0.70)
Heparin Unfractionated: 0.24 IU/mL — ABNORMAL LOW (ref 0.30–0.70)
Heparin Unfractionated: 0.36 [IU]/mL (ref 0.30–0.70)

## 2021-04-11 LAB — CBC
HCT: 41.8 % (ref 39.0–52.0)
Hemoglobin: 13.4 g/dL (ref 13.0–17.0)
MCH: 26.5 pg (ref 26.0–34.0)
MCHC: 32.1 g/dL (ref 30.0–36.0)
MCV: 82.8 fL (ref 80.0–100.0)
Platelets: 302 K/uL (ref 150–400)
RBC: 5.05 MIL/uL (ref 4.22–5.81)
RDW: 15.2 % (ref 11.5–15.5)
WBC: 15.6 K/uL — ABNORMAL HIGH (ref 4.0–10.5)
nRBC: 0 % (ref 0.0–0.2)

## 2021-04-11 LAB — PROCALCITONIN: Procalcitonin: 0.38 ng/mL

## 2021-04-11 MED ORDER — AZITHROMYCIN 500 MG PO TABS
500.0000 mg | ORAL_TABLET | Freq: Every day | ORAL | Status: DC
  Administered 2021-04-11 – 2021-04-13 (×3): 500 mg via ORAL
  Filled 2021-04-11 (×3): qty 1

## 2021-04-11 MED ORDER — POLYETHYLENE GLYCOL 3350 17 G PO PACK
17.0000 g | PACK | Freq: Every day | ORAL | Status: DC
  Administered 2021-04-11: 17 g via ORAL
  Filled 2021-04-11 (×4): qty 1

## 2021-04-11 MED ORDER — SODIUM CHLORIDE 0.9 % IV SOLN
2.0000 g | INTRAVENOUS | Status: DC
  Administered 2021-04-11 – 2021-04-13 (×3): 2 g via INTRAVENOUS
  Filled 2021-04-11 (×4): qty 20

## 2021-04-11 MED ORDER — PANTOPRAZOLE SODIUM 40 MG PO TBEC
40.0000 mg | DELAYED_RELEASE_TABLET | Freq: Every day | ORAL | Status: DC
  Administered 2021-04-11 – 2021-04-14 (×4): 40 mg via ORAL
  Filled 2021-04-11 (×4): qty 1

## 2021-04-11 NOTE — Progress Notes (Signed)
ANTICOAGULATION CONSULT NOTE  Pharmacy Consult for heparin Indication: pulmonary embolus  No Known Allergies  Patient Measurements: Height: 5\' 7"  (170.2 cm) Weight: 101.2 kg (223 lb 1.7 oz) IBW/kg (Calculated) : 66.1 Heparin Dosing Weight: 101 kg   Vital Signs: Temp: 98.7 F (37.1 C) (06/01 1213) Temp Source: Oral (06/01 1213) BP: 119/76 (06/01 1215) Pulse Rate: 98 (06/01 0609)  Labs: Recent Labs    04/08/21 1359 04/08/21 2037 04/09/21 0627 04/09/21 1248 04/10/21 0121 04/11/21 0021 04/11/21 1111  HGB  --    < > 15.3  --  14.0 13.4  --   HCT  --   --  47.2  --  43.4 41.8  --   PLT  --   --  388  --  331 302  --   HEPARINUNFRC  --    < > 0.69   < > 0.39 0.24* 0.24*  CREATININE  --   --  1.46*  --  1.33* 1.29*  --   TROPONINIHS 16  --   --   --   --   --   --    < > = values in this interval not displayed.    Estimated Creatinine Clearance: 73.3 mL/min (A) (by C-G formula based on SCr of 1.29 mg/dL (H)).   Medical History: Past Medical History:  Diagnosis Date  . Acute systolic CHF (congestive heart failure) (HCC) 03/26/2021  . Hyperlipidemia   . Hypertension   . Obese     Medications:  Medications Prior to Admission  Medication Sig Dispense Refill Last Dose  . acetaminophen (TYLENOL) 325 MG tablet Take 325-650 mg by mouth every 6 (six) hours as needed (for headaches).   unknown  . carvedilol (COREG) 6.25 MG tablet Take 1 tablet (6.25 mg total) by mouth 2 (two) times daily with a meal. 60 tablet 0 04/08/2021 at 9am  . dapagliflozin propanediol (FARXIGA) 10 MG TABS tablet Take 1 tablet (10 mg total) by mouth daily. 30 tablet 0 04/08/2021 at am  . furosemide (LASIX) 20 MG tablet Take 1 tablet (20 mg total) by mouth daily as needed for fluid or edema. 30 tablet 0 couple days ago  . rosuvastatin (CRESTOR) 40 MG tablet Take 1 tablet (40 mg total) by mouth daily. 30 tablet 0 04/08/2021 at am  . sacubitril-valsartan (ENTRESTO) 49-51 MG Take 1 tablet by mouth 2 (two)  times daily. 60 tablet 0 04/08/2021 at am  . spironolactone (ALDACTONE) 25 MG tablet Take 1 tablet (25 mg total) by mouth daily. 30 tablet 0 04/08/2021 at am    Assessment: 55 YOM found to have an acute PE. Pharmacy consulted to dose IV heparin. He is noted with hemoptysis and he was on transexamic acid nebs (5/31> 6/1). Plans are noted for apixaban when able to change to oral anticoagulation.  -heparin level below goal -hg= 13.4 and stable  Goal of Therapy:  Heparin level= 0.3-0.5 Monitor platelets by anticoagulation protocol: Yes   Plan:  -Increase heparin to 1650 units/hr -Heparin level in 6 hours and daily wth CBC daily   8/1, PharmD Clinical Pharmacist **Pharmacist phone directory can now be found on [email or web address] ([email or web address]).  Listed under [facility].

## 2021-04-11 NOTE — Progress Notes (Signed)
ANTICOAGULATION CONSULT NOTE  Pharmacy Consult for heparin Indication: pulmonary embolus  Assessment: 45 YOM found to have an acute PE. Pharmacy consulted to dose IV heparin. He is noted with hemoptysis and on transexamic acid nebs (started 5/31). Plans are noted for apixaban when able to change to oral anticoagulation.  Heparin level this am 0.24 units/ml.  No issues with infusion noted No increase in hemoptysis per RN  Goal of Therapy:  Heparin level= 0.3-0.5 Monitor platelets by anticoagulation protocol: Yes   Plan:  -Increase heparin to 1500 units/hr -Check heparin level in 6-8 hours -Daily heparin level and CBC  Thanks for allowing pharmacy to be a part of this patient's care.  Talbert Cage, PharmD Clinical Pharmacist

## 2021-04-11 NOTE — Progress Notes (Signed)
PROGRESS NOTE  Andres Roman ZOX:[id number] DOB: April 15, 1966 DOA: 04/08/2021 PCP: Gillian Scarce, MD   LOS: 2 days   Brief Narrative / Interim history: 55 year old male with history of HTN, HLD, prediabetes, chronic systolic CHF, chronic kidney disease stage IIIa comes into the hospital 5/29 with hemoptysis.  He was hospitalized and discharged about a week ago with new onset acute systolic CHF, got diuresed, cardiac cath showed nonobstructive CAD and discharged home.  He was doing well up until the morning of admission when he was having chest discomfort and started coughing up blood.  In the ER CT angiogram showed occlusive pulmonary embolus within the segmental artery in the right lower lobe with groundglass opacity in the right lower lobe likely pulmonary infarct.  He was hypoxic and admitted to the hospital.  Subjective / 24h Interval events: Feels like food is getting stuck in his chest when he swallows  Assessment & Plan:  Acute hypoxic respiratory failure due to acute PE in the right lower lobe artery, with hemoptysis -patient remains hypoxic requiring 2 L supplemental oxygen.   -Unfortunately continues to have significant hemoptysis.  -Per Dr. Elvera Lennox: case discussed with Dr. Katrinka Blazing with PCCM who recommends tranexamic acid nebulizers for now with close inpatient monitoring.  Risks and benefits of oral anticoagulation have been discussed with the patient and will place on Eliquis when his hemoptysis is improving -2D echo done 5/30 showed normal RV  Esophageal dysphagia -dg esophagus  Chronic systolic CHF-2D echo done 2 weeks ago showed EF 25-30%, cardiac cath on 5/18 showed mild nonobstructive CAD.  Due to soft blood pressures on admission his home medications are on hold except for Coreg.  Continue to hold furosemide, spironolactone, Entresto, he appears euvolemic  Hyponatremia -mild, monitor  Leukocytosis  -2 view chest x ray to r/o PNA -may need abx -check  procalcitonin  Elevated troponin  - Likely demand due to PE  Chronic kidney disease stage IIIa -Baseline creatinine 1.2-1.5, currently within his baseline  Hyperlipidemia -continue statin  Prediabetes -noted, A1c 6.1   Scheduled Meds: . carvedilol  6.25 mg Oral BID WC  . lidocaine  1 patch Transdermal Q24H  . polyethylene glycol  17 g Oral Daily  . rosuvastatin  40 mg Oral Daily  . senna-docusate  2 tablet Oral BID   Continuous Infusions: . sodium chloride Stopped (04/08/21 1447)  . heparin 1,500 Units/hr (04/11/21 1018)   PRN Meds:.sodium chloride, acetaminophen **OR** acetaminophen, albuterol, ondansetron **OR** ondansetron (ZOFRAN) IV, oxyCODONE  Diet Orders (From admission, onward)    Start     Ordered   04/08/21 1745  Diet Heart Room service appropriate? Yes; Fluid consistency: Thin  Diet effective now       Question Answer Comment  Room service appropriate? Yes   Fluid consistency: Thin      04/08/21 1747          DVT prophylaxis:      Code Status: Full Code  Family Communication: No family at bedside  Status is: Inpatient   Inpatient level of care appropriate due to severity of illness  Dispo: The patient is from: Home              Anticipated d/c is to: Home              Patient currently is not medically stable to d/c.   Difficult to place patient No  Level of care: Telemetry Cardiac  Consultants:  pulm (phone)   Objective: Vitals:   04/11/21 8119  04/11/21 0830 04/11/21 1213 04/11/21 1215  BP: 113/78 113/78 119/76 119/76  Pulse:      Resp: 20  (!) 26 (!) 21  Temp: 99.2 F (37.3 C)  98.7 F (37.1 C)   TempSrc: Oral  Oral   SpO2: 91%  90%   Weight:      Height:        Intake/Output Summary (Last 24 hours) at 04/11/2021 1236 Last data filed at 04/11/2021 3016 Gross per 24 hour  Intake 665.02 ml  Output 300 ml  Net 365.02 ml   Filed Weights   04/09/21 0346 04/10/21 0300 04/11/21 0358  Weight: 101 kg 101.6 kg 101.2 kg     Examination:  General: Appearance:    Obese male in mild respiratory distress     Lungs:     rales at bases, on [place]  Heart:    Normal heart rate.   MS:   All extremities are intact.   Neurologic:   Awake, alert, oriented x 3.   Data Reviewed: I have independently reviewed following labs and imaging studies   CBC: Recent Labs  Lab 04/08/21 1146 04/09/21 0627 04/10/21 0121 04/11/21 0021  WBC 10.2 13.0* 14.7* 15.6*  NEUTROABS 5.3  --   --   --   HGB 14.9 15.3 14.0 13.4  HCT 46.1 47.2 43.4 41.8  MCV 81.9 81.7 82.5 82.8  PLT 341 388 331 302   Basic Metabolic Panel: Recent Labs  Lab 04/08/21 1146 04/09/21 0627 04/10/21 0121 04/11/21 0021  NA 133* 131* 131* 133*  K 4.3 4.7 4.5 4.3  CL 104 101 101 101  CO2 21* 18* 22 21*  GLUCOSE 107* 132* 116* 101*  BUN 22* 20 18 17   CREATININE 1.35* 1.46* 1.33* 1.29*  CALCIUM 9.1 9.3 8.9 9.0  MG  --  1.9  --   --   PHOS  --  5.5*  --   --    Liver Function Tests: Recent Labs  Lab 04/08/21 1146 04/09/21 0627  AST 32 24  ALT 30 27  ALKPHOS 66 69  BILITOT 0.7 1.2  PROT 7.7 7.9  ALBUMIN 3.5 3.5   Coagulation Profile: No results for input(s): INR, PROTIME in the last 168 hours. HbA1C: No results for input(s): HGBA1C in the last 72 hours. CBG: No results for input(s): GLUCAP in the last 168 hours.  Recent Results (from the past 240 hour(s))  Resp Panel by RT-PCR (Flu A&B, Covid) Nasopharyngeal Swab     Status: None   Collection Time: 04/08/21  1:59 PM   Specimen: Nasopharyngeal Swab; Nasopharyngeal(NP) swabs in vial transport medium  Result Value Ref Range Status   SARS Coronavirus 2 by RT PCR NEGATIVE NEGATIVE Final    Comment: (NOTE) SARS-CoV-2 target nucleic acids are NOT DETECTED.  The SARS-CoV-2 RNA is generally detectable in upper respiratory specimens during the acute phase of infection. The lowest concentration of SARS-CoV-2 viral copies this assay can detect is 138 copies/mL. A negative result does not  preclude SARS-Cov-2 infection and should not be used as the sole basis for treatment or other patient management decisions. A negative result may occur with  improper specimen collection/handling, submission of specimen other than nasopharyngeal swab, presence of viral mutation(s) within the areas targeted by this assay, and inadequate number of viral copies(<138 copies/mL). A negative result must be combined with clinical observations, patient history, and epidemiological information. The expected result is Negative.  Fact Sheet for Patients:  04/10/21  Fact Sheet for Healthcare  Providers:  SeriousBroker.it  This test is no t yet approved or cleared by the [place] and  has been authorized for detection and/or diagnosis of SARS-CoV-2 by FDA under an Emergency Use Authorization (EUA). This EUA will remain  in effect (meaning this test can be used) for the duration of the COVID-19 declaration under Section 564(b)(1) of the Act, 21 U.S.C.section 360bbb-3(b)(1), unless the authorization is terminated  or revoked sooner.       Influenza A by PCR NEGATIVE NEGATIVE Final   Influenza B by PCR NEGATIVE NEGATIVE Final    Comment: (NOTE) The Xpert Xpress SARS-CoV-2/FLU/RSV plus assay is intended as an aid in the diagnosis of influenza from Nasopharyngeal swab specimens and should not be used as a sole basis for treatment. Nasal washings and aspirates are unacceptable for Xpert Xpress SARS-CoV-2/FLU/RSV testing.  Fact Sheet for Patients: BloggerCourse.com  Fact Sheet for Healthcare Providers: SeriousBroker.it  This test is not yet approved or cleared by the [place] FDA and has been authorized for detection and/or diagnosis of SARS-CoV-2 by FDA under an Emergency Use Authorization (EUA). This EUA will remain in effect (meaning this test can be used) for the  duration of the COVID-19 declaration under Section 564(b)(1) of the Act, 21 U.S.C. section 360bbb-3(b)(1), unless the authorization is terminated or revoked.  Performed at [facility], [address] [place], [place] [address]      Radiology Studies: No results found.  Marlin Canary DO [facility]  Between 7 am - 7 pm I am available, please contact me via Amion (for emergencies) or Securechat (non urgent messages)  Between 7 pm - 7 am I am not available, please contact night coverage MD/APP via Amion

## 2021-04-11 NOTE — Plan of Care (Signed)

## 2021-04-11 NOTE — Progress Notes (Addendum)
ANTICOAGULATION CONSULT NOTE  Pharmacy Consult for heparin Indication: pulmonary embolus  No Known Allergies  Patient Measurements: Height: 5\' 7"  (170.2 cm) Weight: 101.2 kg (223 lb 1.7 oz) IBW/kg (Calculated) : 66.1 Heparin Dosing Weight: 101 kg   Vital Signs: Temp: 99.6 F (37.6 C) (06/01 1940) Temp Source: Oral (06/01 1940) BP: 130/91 (06/01 1940) Pulse Rate: 98 (06/01 1940)  Labs: Recent Labs    04/09/21 0627 04/09/21 1248 04/10/21 0121 04/11/21 0021 04/11/21 1111 04/11/21 1925  HGB 15.3  --  14.0 13.4  --   --   HCT 47.2  --  43.4 41.8  --   --   PLT 388  --  331 302  --   --   HEPARINUNFRC 0.69   < > 0.39 0.24* 0.24* 0.36  CREATININE 1.46*  --  1.33* 1.29*  --   --    < > = values in this interval not displayed.    Estimated Creatinine Clearance: 73.3 mL/min (A) (by C-G formula based on SCr of 1.29 mg/dL (H)).   Medical History: Past Medical History:  Diagnosis Date  . Acute systolic CHF (congestive heart failure) (HCC) 03/26/2021  . Hyperlipidemia   . Hypertension   . Obese     Medications:  Medications Prior to Admission  Medication Sig Dispense Refill Last Dose  . acetaminophen (TYLENOL) 325 MG tablet Take 325-650 mg by mouth every 6 (six) hours as needed (for headaches).   unknown  . carvedilol (COREG) 6.25 MG tablet Take 1 tablet (6.25 mg total) by mouth 2 (two) times daily with a meal. 60 tablet 0 04/08/2021 at 9am  . dapagliflozin propanediol (FARXIGA) 10 MG TABS tablet Take 1 tablet (10 mg total) by mouth daily. 30 tablet 0 04/08/2021 at am  . furosemide (LASIX) 20 MG tablet Take 1 tablet (20 mg total) by mouth daily as needed for fluid or edema. 30 tablet 0 couple days ago  . rosuvastatin (CRESTOR) 40 MG tablet Take 1 tablet (40 mg total) by mouth daily. 30 tablet 0 04/08/2021 at am  . sacubitril-valsartan (ENTRESTO) 49-51 MG Take 1 tablet by mouth 2 (two) times daily. 60 tablet 0 04/08/2021 at am  . spironolactone (ALDACTONE) 25 MG tablet Take 1  tablet (25 mg total) by mouth daily. 30 tablet 0 04/08/2021 at am    Assessment: 55 YOM found to have an acute PE. Pharmacy consulted to dose IV heparin. He is noted with hemoptysis and he was on transexamic acid nebs (5/31> 6/1). Plans are noted for apixaban when able to change to oral anticoagulation.   Heparin level 0.36 on heparin 1650 units/hr is therapeutic. Per RN no hemoptysis noted or other bleeding noted.   Goal of Therapy:  Heparin level= 0.3-0.5 Monitor platelets by anticoagulation protocol: Yes   Plan:  Continue heparin 1650 units/hr Check 6hr confirmatory heparin level    8/1, PharmD Clinical Pharmacist   ADDENDUM: RN called back and said patient is reporting some hemoptysis this morning and again this evening at ~2030. RN to notify MD.   07-11-1990, PharmD Clinical Pharmacist

## 2021-04-12 DIAGNOSIS — N1831 Chronic kidney disease, stage 3a: Secondary | ICD-10-CM

## 2021-04-12 LAB — CBC
HCT: 39.4 % (ref 39.0–52.0)
Hemoglobin: 12.5 g/dL — ABNORMAL LOW (ref 13.0–17.0)
MCH: 26 pg (ref 26.0–34.0)
MCHC: 31.7 g/dL (ref 30.0–36.0)
MCV: 81.9 fL (ref 80.0–100.0)
Platelets: 286 10*3/uL (ref 150–400)
RBC: 4.81 MIL/uL (ref 4.22–5.81)
RDW: 15 % (ref 11.5–15.5)
WBC: 10.9 10*3/uL — ABNORMAL HIGH (ref 4.0–10.5)
nRBC: 0 % (ref 0.0–0.2)

## 2021-04-12 LAB — BASIC METABOLIC PANEL
Anion gap: 9 (ref 5–15)
BUN: 17 mg/dL (ref 6–20)
CO2: 24 mmol/L (ref 22–32)
Calcium: 8.7 mg/dL — ABNORMAL LOW (ref 8.9–10.3)
Chloride: 101 mmol/L (ref 98–111)
Creatinine, Ser: 1.38 mg/dL — ABNORMAL HIGH (ref 0.61–1.24)
GFR, Estimated: >60 mL/min (ref >60)
Glucose, Bld: 106 mg/dL — ABNORMAL HIGH (ref 70–99)
Potassium: 4.2 mmol/L (ref 3.5–5.1)
Sodium: 134 mmol/L — ABNORMAL LOW (ref 135–145)

## 2021-04-12 LAB — HEPARIN LEVEL (UNFRACTIONATED): Heparin Unfractionated: 0.45 IU/mL (ref 0.30–0.70)

## 2021-04-12 MED ORDER — SACUBITRIL-VALSARTAN 49-51 MG PO TABS
1.0000 | ORAL_TABLET | Freq: Two times a day (BID) | ORAL | Status: DC
  Administered 2021-04-12 – 2021-04-14 (×5): 1 via ORAL
  Filled 2021-04-12 (×6): qty 1

## 2021-04-12 MED ORDER — SPIRONOLACTONE 25 MG PO TABS
25.0000 mg | ORAL_TABLET | Freq: Every day | ORAL | Status: DC
  Administered 2021-04-12 – 2021-04-14 (×3): 25 mg via ORAL
  Filled 2021-04-12 (×3): qty 1

## 2021-04-12 MED ORDER — GUAIFENESIN-CODEINE 100-10 MG/5ML PO SOLN
10.0000 mL | Freq: Four times a day (QID) | ORAL | Status: DC | PRN
  Administered 2021-04-12 – 2021-04-13 (×3): 10 mL via ORAL
  Filled 2021-04-12 (×4): qty 10

## 2021-04-12 MED ORDER — TRAMADOL HCL 50 MG PO TABS
50.0000 mg | ORAL_TABLET | Freq: Four times a day (QID) | ORAL | Status: DC | PRN
  Administered 2021-04-12 – 2021-04-13 (×2): 50 mg via ORAL
  Filled 2021-04-12 (×2): qty 1

## 2021-04-12 NOTE — Progress Notes (Signed)
PROGRESS NOTE  Andres Roman CBJ:[id number] DOB: 1966-02-19 DOA: 04/08/2021 PCP: Gillian Scarce, MD   LOS: 3 days   Brief Narrative / Interim history: 55 year old male with history of HTN, HLD, prediabetes, chronic systolic CHF, chronic kidney disease stage IIIa comes into the hospital 5/29 with hemoptysis.  He was hospitalized and discharged about a week ago with new onset acute systolic CHF, got diuresed, cardiac cath showed nonobstructive CAD and discharged home.  He was doing well up until the morning of admission when he was having chest discomfort and started coughing up blood.  In the ER CT angiogram showed occlusive pulmonary embolus within the segmental artery in the right lower lobe with groundglass opacity in the right lower lobe likely pulmonary infarct.  He was hypoxic and admitted to the hospital.  Stay complicated by continued hemoptysis and pneumonia.    Subjective / 24h Interval events: Swallowing better Still coughing up blood  Assessment & Plan:  Acute hypoxic respiratory failure due to acute PE in the right lower lobe artery, with hemoptysis -continues to have hemoptysis.  Have asked nurses to collect in cup for 24 hours  -Per Dr. Elvera Lennox: case discussed with Dr. Katrinka Blazing with PCCM who recommends tranexamic acid nebulizers for now with close inpatient monitoring.  Risks and benefits of oral anticoagulation have been discussed with the patient and will place on Eliquis when his hemoptysis is improving -2D echo done 5/30 showed normal RV  Esophageal dysphagia -dg esophagus shows no stricture -added PPI  Chronic systolic CHF-2D echo done 2 weeks ago showed EF 25-30%, cardiac cath on 5/18 showed mild nonobstructive CAD.   -resume home meds  Hyponatremia  -mild, monitor  Leukocytosis due to PNA -improved on IV abx  Elevated troponin  - Likely demand due to PE  Chronic kidney disease stage IIIa  -Baseline creatinine 1.2-1.5, currently within his  baseline  Hyperlipidemia -continue statin  Prediabetes -noted, A1c 6.1   Scheduled Meds: . azithromycin  500 mg Oral q1800  . carvedilol  6.25 mg Oral BID WC  . lidocaine  1 patch Transdermal Q24H  . pantoprazole  40 mg Oral Daily  . polyethylene glycol  17 g Oral Daily  . rosuvastatin  40 mg Oral Daily  . senna-docusate  2 tablet Oral BID   Continuous Infusions: . sodium chloride Stopped (04/08/21 1447)  . cefTRIAXone (ROCEPHIN)  IV 2 g (04/11/21 1802)  . heparin 1,650 Units/hr (04/12/21 0219)   PRN Meds:.sodium chloride, acetaminophen **OR** acetaminophen, albuterol, guaiFENesin-codeine, ondansetron **OR** ondansetron (ZOFRAN) IV, traMADol  Diet Orders (From admission, onward)    Start     Ordered   04/08/21 1745  Diet Heart Room service appropriate? Yes; Fluid consistency: Thin  Diet effective now       Question Answer Comment  Room service appropriate? Yes   Fluid consistency: Thin      04/08/21 1747          DVT prophylaxis:      Code Status: Full Code  Family Communication: No family at bedside  Status is: Inpatient   Inpatient level of care appropriate due to severity of illness  Dispo: The patient is from: Home              Anticipated d/c is to: Home              Patient currently is not medically stable to d/c.  Continues to have hemoptysis    Difficult to place patient No  Level of care: Telemetry  Cardiac  Consultants:  pulm (phone)   Objective: Vitals:   04/12/21 0342 04/12/21 0555 04/12/21 0947 04/12/21 1015  BP: (!) 128/92     Pulse: 92  88   Resp: (!) 25     Temp: 99.2 F (37.3 C)  98.3 F (36.8 C)   TempSrc: Oral  Oral   SpO2: 96%   94%  Weight:  100.6 kg    Height:        Intake/Output Summary (Last 24 hours) at 04/12/2021 1026 Last data filed at 04/12/2021 0500 Gross per 24 hour  Intake 186.5 ml  Output 1100 ml  Net -913.5 ml   Filed Weights   04/10/21 0300 04/11/21 0358 04/12/21 0555  Weight: 101.6 kg 101.2 kg 100.6 kg     Examination:  General: Appearance:    Obese male in no acute distress- standing at sink taking a shower     Lungs:     Off [place], diminished breath sounds- no wheezing  Heart:    Normal heart rate.   MS:   All extremities are intact.   Neurologic:   Awake, alert, oriented x 3           Data Reviewed: I have independently reviewed following labs and imaging studies   CBC: Recent Labs  Lab 04/08/21 1146 04/09/21 0627 04/10/21 0121 04/11/21 0021 04/12/21 0114  WBC 10.2 13.0* 14.7* 15.6* 10.9*  NEUTROABS 5.3  --   --   --   --   HGB 14.9 15.3 14.0 13.4 12.5*  HCT 46.1 47.2 43.4 41.8 39.4  MCV 81.9 81.7 82.5 82.8 81.9  PLT 341 388 331 302 286   Basic Metabolic Panel: Recent Labs  Lab 04/08/21 1146 04/09/21 0627 04/10/21 0121 04/11/21 0021 04/12/21 0114  NA 133* 131* 131* 133* 134*  K 4.3 4.7 4.5 4.3 4.2  CL 104 101 101 101 101  CO2 21* 18* 22 21* 24  GLUCOSE 107* 132* 116* 101* 106*  BUN 22* 20 18 17 17   CREATININE 1.35* 1.46* 1.33* 1.29* 1.38*  CALCIUM 9.1 9.3 8.9 9.0 8.7*  MG  --  1.9  --   --   --   PHOS  --  5.5*  --   --   --    Liver Function Tests: Recent Labs  Lab 04/08/21 1146 04/09/21 0627  AST 32 24  ALT 30 27  ALKPHOS 66 69  BILITOT 0.7 1.2  PROT 7.7 7.9  ALBUMIN 3.5 3.5   Coagulation Profile: No results for input(s): INR, PROTIME in the last 168 hours. HbA1C: No results for input(s): HGBA1C in the last 72 hours. CBG: No results for input(s): GLUCAP in the last 168 hours.  Recent Results (from the past 240 hour(s))  Resp Panel by RT-PCR (Flu A&B, Covid) Nasopharyngeal Swab     Status: None   Collection Time: 04/08/21  1:59 PM   Specimen: Nasopharyngeal Swab; Nasopharyngeal(NP) swabs in vial transport medium  Result Value Ref Range Status   SARS Coronavirus 2 by RT PCR NEGATIVE NEGATIVE Final    Comment: (NOTE) SARS-CoV-2 target nucleic acids are NOT DETECTED.  The SARS-CoV-2 RNA is generally detectable in upper  respiratory specimens during the acute phase of infection. The lowest concentration of SARS-CoV-2 viral copies this assay can detect is 138 copies/mL. A negative result does not preclude SARS-Cov-2 infection and should not be used as the sole basis for treatment or other patient management decisions. A negative result may occur with  improper specimen  collection/handling, submission of specimen other than nasopharyngeal swab, presence of viral mutation(s) within the areas targeted by this assay, and inadequate number of viral copies(<138 copies/mL). A negative result must be combined with clinical observations, patient history, and epidemiological information. The expected result is Negative.  Fact Sheet for Patients:  BloggerCourse.com  Fact Sheet for Healthcare Providers:  SeriousBroker.it  This test is no t yet approved or cleared by the [place] FDA and  has been authorized for detection and/or diagnosis of SARS-CoV-2 by FDA under an Emergency Use Authorization (EUA). This EUA will remain  in effect (meaning this test can be used) for the duration of the COVID-19 declaration under Section 564(b)(1) of the Act, 21 U.S.C.section 360bbb-3(b)(1), unless the authorization is terminated  or revoked sooner.       Influenza A by PCR NEGATIVE NEGATIVE Final   Influenza B by PCR NEGATIVE NEGATIVE Final    Comment: (NOTE) The Xpert Xpress SARS-CoV-2/FLU/RSV plus assay is intended as an aid in the diagnosis of influenza from Nasopharyngeal swab specimens and should not be used as a sole basis for treatment. Nasal washings and aspirates are unacceptable for Xpert Xpress SARS-CoV-2/FLU/RSV testing.  Fact Sheet for Patients: BloggerCourse.com  Fact Sheet for Healthcare Providers: SeriousBroker.it  This test is not yet approved or cleared by the [place] FDA and has been  authorized for detection and/or diagnosis of SARS-CoV-2 by FDA under an Emergency Use Authorization (EUA). This EUA will remain in effect (meaning this test can be used) for the duration of the COVID-19 declaration under Section 564(b)(1) of the Act, 21 U.S.C. section 360bbb-3(b)(1), unless the authorization is terminated or revoked.  Performed at [facility], [address] [place], [place] [address]      Radiology Studies: DG Chest 2 View  Result Date: 04/11/2021 CLINICAL DATA:  Fever.  Hemoptysis. EXAM: CHEST - 2 VIEW COMPARISON:  One-view chest x-ray 5/29/2.  CT a chest 04/08/2021 FINDINGS: Heart is enlarged. Increasing right lower lobe airspace disease is present. Right pleural effusion is suspected. Right middle lobe airspace disease is also present. IMPRESSION: 1. Increasing right lower and lobe airspace disease. This may represent infection superimposed on previously suspected infarct. Additional atelectasis or infarct not excluded. 2. Right pleural effusion. 3. Cardiomegaly. Electronically Signed   By: Marin Roberts M.D.   On: 04/11/2021 16:40   DG ESOPHAGUS W DOUBLE CM (HD)  Result Date: 04/11/2021 CLINICAL DATA:  Fever and hemoptysis with dysphagia and globus sensation. EXAM: ESOPHOGRAM / BARIUM SWALLOW / BARIUM TABLET STUDY TECHNIQUE: Combined double contrast and single contrast examination performed using effervescent crystals, thick barium liquid, and thin barium liquid. The patient was observed with fluoroscopy swallowing a 13 mm barium sulphate tablet. FLUOROSCOPY TIME:  Fluoroscopy Time:  2 minutes 18 seconds Radiation Exposure Index (if provided by the fluoroscopic device): 38 mGy Number of Acquired Spot Images: 2 COMPARISON:  Chest CT of Apr 08, 2021. FINDINGS: Thin barium was swallowed without signs of gross aspiration or penetration initially. High-density barium was then administered following effervescent crystals showing normal distensibility and caliber of the  esophagus. Mild fold thickening. 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 Prone RAO position with single swallow showed intact primary wave without significant dysmotility. Small Schatzki's ring approximately 13 mm. Barium tablet passed without difficulty. No sign of gastroesophageal reflux observed during the evaluation. IMPRESSION: Normal motility, small Schatzki's ring suggested but without obstruction upon swallowing of barium tablet. Mild fold thickening may indicate mild esophagitis. Electronically Signed  By: Donzetta Kohut M.D.   On: 04/11/2021 16:06    Marlin Canary DO [facility]  Between 7 am - 7 pm I am available, please contact me via Amion (for emergencies) or Securechat (non urgent messages)  Between 7 pm - 7 am I am not available, please contact night coverage MD/APP via Amion

## 2021-04-12 NOTE — Progress Notes (Signed)
ANTICOAGULATION CONSULT NOTE  Pharmacy Consult for Heparin Indication: pulmonary embolus  No Known Allergies  Patient Measurements: Height: 5\' 7"  (170.2 cm) Weight: 101.2 kg (223 lb 1.7 oz) IBW/kg (Calculated) : 66.1 Heparin Dosing Weight: 101 kg   Vital Signs: Temp: 98.6 F (37 C) (06/01 2341) Temp Source: Oral (06/01 2341) BP: 112/78 (06/01 2341) Pulse Rate: 88 (06/01 2341)  Labs: Recent Labs    04/10/21 0121 04/11/21 0021 04/11/21 1111 04/11/21 1925 04/12/21 0114  HGB 14.0 13.4  --   --  12.5*  HCT 43.4 41.8  --   --  39.4  PLT 331 302  --   --  286  HEPARINUNFRC 0.39 0.24* 0.24* 0.36 0.45  CREATININE 1.33* 1.29*  --   --  1.38*    Estimated Creatinine Clearance: 68.5 mL/min (A) (by C-G formula based on SCr of 1.38 mg/dL (H)).   Medical History: Past Medical History:  Diagnosis Date  . Acute systolic CHF (congestive heart failure) (HCC) 03/26/2021  . Hyperlipidemia   . Hypertension   . Obese     Medications:  Medications Prior to Admission  Medication Sig Dispense Refill Last Dose  . acetaminophen (TYLENOL) 325 MG tablet Take 325-650 mg by mouth every 6 (six) hours as needed (for headaches).   unknown  . carvedilol (COREG) 6.25 MG tablet Take 1 tablet (6.25 mg total) by mouth 2 (two) times daily with a meal. 60 tablet 0 04/08/2021 at 9am  . dapagliflozin propanediol (FARXIGA) 10 MG TABS tablet Take 1 tablet (10 mg total) by mouth daily. 30 tablet 0 04/08/2021 at am  . furosemide (LASIX) 20 MG tablet Take 1 tablet (20 mg total) by mouth daily as needed for fluid or edema. 30 tablet 0 couple days ago  . rosuvastatin (CRESTOR) 40 MG tablet Take 1 tablet (40 mg total) by mouth daily. 30 tablet 0 04/08/2021 at am  . sacubitril-valsartan (ENTRESTO) 49-51 MG Take 1 tablet by mouth 2 (two) times daily. 60 tablet 0 04/08/2021 at am  . spironolactone (ALDACTONE) 25 MG tablet Take 1 tablet (25 mg total) by mouth daily. 30 tablet 0 04/08/2021 at am    Assessment: Andres Roman  found to have an acute PE. Pharmacy consulted to dose IV heparin. He is noted with hemoptysis and he was on transexamic acid nebs (5/31> 6/1). Plans are noted for apixaban when able to change to oral anticoagulation.   Heparin level 0.36 on heparin 1650 units/hr is therapeutic. Per RN no hemoptysis noted or other bleeding noted.    6/2 AM update:  Heparin level therapeutic x 2   Goal of Therapy:  Heparin level= 0.3-0.5 units/mL Monitor platelets by anticoagulation protocol: Yes   Plan:  Continue heparin 1650 units/hr Daily CBC/heparin level Monitor for any further hemoptysis   8/2, PharmD, BCPS Clinical Pharmacist Phone: [phone]

## 2021-04-13 ENCOUNTER — Other Ambulatory Visit (HOSPITAL_COMMUNITY): Payer: Self-pay

## 2021-04-13 DIAGNOSIS — J90 Pleural effusion, not elsewhere classified: Secondary | ICD-10-CM

## 2021-04-13 LAB — BASIC METABOLIC PANEL
Anion gap: 10 (ref 5–15)
BUN: 14 mg/dL (ref 6–20)
CO2: 21 mmol/L — ABNORMAL LOW (ref 22–32)
Calcium: 8.1 mg/dL — ABNORMAL LOW (ref 8.9–10.3)
Chloride: 102 mmol/L (ref 98–111)
Creatinine, Ser: 1.14 mg/dL (ref 0.61–1.24)
GFR, Estimated: >60 mL/min (ref >60)
Glucose, Bld: 104 mg/dL — ABNORMAL HIGH (ref 70–99)
Potassium: 4 mmol/L (ref 3.5–5.1)
Sodium: 133 mmol/L — ABNORMAL LOW (ref 135–145)

## 2021-04-13 LAB — CBC
HCT: 40.6 % (ref 39.0–52.0)
Hemoglobin: 13.1 g/dL (ref 13.0–17.0)
MCH: 26.6 pg (ref 26.0–34.0)
MCHC: 32.3 g/dL (ref 30.0–36.0)
MCV: 82.5 fL (ref 80.0–100.0)
Platelets: 285 10*3/uL (ref 150–400)
RBC: 4.92 MIL/uL (ref 4.22–5.81)
RDW: 14.9 % (ref 11.5–15.5)
WBC: 8.5 10*3/uL (ref 4.0–10.5)
nRBC: 0 % (ref 0.0–0.2)

## 2021-04-13 LAB — STREP PNEUMONIAE URINARY ANTIGEN: Strep Pneumo Urinary Antigen: NEGATIVE

## 2021-04-13 LAB — HEPARIN LEVEL (UNFRACTIONATED): Heparin Unfractionated: 0.5 IU/mL (ref 0.30–0.70)

## 2021-04-13 MED ORDER — APIXABAN 5 MG PO TABS: 5.0000 mg | ORAL_TABLET | Freq: Two times a day (BID) | ORAL | 0 refills | Status: DC

## 2021-04-13 MED ORDER — PANTOPRAZOLE SODIUM 40 MG PO TBEC
40.0000 mg | DELAYED_RELEASE_TABLET | Freq: Every day | ORAL | 0 refills | Status: DC
  Filled 2021-04-13: qty 30, 30d supply, fill #0

## 2021-04-13 MED ORDER — DOXYCYCLINE HYCLATE 100 MG PO TABS
100.0000 mg | ORAL_TABLET | Freq: Two times a day (BID) | ORAL | 0 refills | Status: DC
Start: 2021-04-13 — End: 2021-05-21
  Filled 2021-04-13: qty 6, 3d supply, fill #0

## 2021-04-13 MED ORDER — APIXABAN (ELIQUIS) VTE STARTER PACK (10MG AND 5MG)
ORAL_TABLET | ORAL | 0 refills | Status: DC
  Filled 2021-04-13: qty 74, 28d supply, fill #0

## 2021-04-13 NOTE — Progress Notes (Signed)
Mobility Specialist: Progress Note   04/13/21 1117  Mobility  Activity Ambulated in hall  Level of Assistance Independent after set-up  Assistive Device  (IV Pole)  Distance Ambulated (ft) 430 ft  Mobility Ambulated independently in hallway  Mobility Response Tolerated well  Mobility performed by Mobility specialist  $Mobility charge 1 Mobility   Pre-Mobility on 3 L/min [place]: 93 HR, 94% SpO2 During Mobility on RA: 90 HR, 97% SpO2 Post-Mobility on RA: 90 HR, 102/73 BP, 96% SpO2  Pt began ambulation on 3 L/min  but was weaned to RA throughout. Pt asx during ambulation. Pt is sitting EOB after walk with call bell at his side with sats maintaining 93-94% on RA, RN notified.   [facility] Andres Roman Mobility Specialist Mobility Specialist Phone: [phone]

## 2021-04-13 NOTE — Progress Notes (Signed)
PROGRESS NOTE  Andres Roman BTD:[id number] DOB: 10-Apr-1966 DOA: 04/08/2021 PCP: Gillian Scarce, MD   LOS: 4 days   Brief Narrative / Interim history: 55 year old male with history of HTN, HLD, prediabetes, chronic systolic CHF, chronic kidney disease stage IIIa comes into the hospital 5/29 with hemoptysis.  He was hospitalized and discharged about a week ago with new onset acute systolic CHF, got diuresed, cardiac cath showed nonobstructive CAD and discharged home.  He was doing well up until the morning of admission when he was having chest discomfort and started coughing up blood.  In the ER CT angiogram showed occlusive pulmonary embolus within the segmental artery in the right lower lobe with groundglass opacity in the right lower lobe likely pulmonary infarct.  He was hypoxic and admitted to the hospital.  Stay complicated by continued (but lessened) hemoptysis and pneumonia.     Subjective / 24h Interval events: Able to walk off O2 Says hemoptysis is becoming less and getting lighter  Assessment & Plan:  Acute hypoxic respiratory failure due to acute PE in the right lower lobe artery, with hemoptysis -continues to have hemoptysis.  Have asked nurses to collect in cup for 24 hours  -Per Dr. Elvera Lennox: case discussed with Dr. Katrinka Blazing with PCCM who recommends tranexamic acid nebulizers for now with close inpatient monitoring.  Risks and benefits of oral anticoagulation have been discussed with the patient and will place on Eliquis when his hemoptysis is improving -2D echo done 5/30 showed normal RV -repeat x rays in AM  Esophageal dysphagia -dg esophagus shows no stricture -added PPI with improvement  Chronic systolic CHF-2D echo done 2 weeks ago showed EF 25-30%, cardiac cath on 5/18 showed mild nonobstructive CAD.   -resume home meds  Hyponatremia  -mild, monitor  Leukocytosis due to PNA -improved on IV abx  Elevated troponin  - Likely demand due to PE  Chronic kidney disease  stage IIIa  -Baseline creatinine 1.2-1.5, currently within his baseline  Hyperlipidemia -continue statin  Prediabetes -noted, A1c 6.1   Scheduled Meds: . azithromycin  500 mg Oral q1800  . carvedilol  6.25 mg Oral BID WC  . lidocaine  1 patch Transdermal Q24H  . pantoprazole  40 mg Oral Daily  . polyethylene glycol  17 g Oral Daily  . rosuvastatin  40 mg Oral Daily  . sacubitril-valsartan  1 tablet Oral BID  . senna-docusate  2 tablet Oral BID  . spironolactone  25 mg Oral Daily   Continuous Infusions: . sodium chloride Stopped (04/08/21 1447)  . cefTRIAXone (ROCEPHIN)  IV 2 g (04/12/21 1819)  . heparin 1,650 Units/hr (04/13/21 0922)   PRN Meds:.sodium chloride, acetaminophen **OR** acetaminophen, albuterol, guaiFENesin-codeine, ondansetron **OR** ondansetron (ZOFRAN) IV, traMADol  Diet Orders (From admission, onward)    Start     Ordered   04/08/21 1745  Diet Heart Room service appropriate? Yes; Fluid consistency: Thin  Diet effective now       Question Answer Comment  Room service appropriate? Yes   Fluid consistency: Thin      04/08/21 1747          DVT prophylaxis:      Code Status: Full Code  Family Communication: No family at bedside  Status is: Inpatient   Inpatient level of care appropriate due to severity of illness  Dispo: The patient is from: Home              Anticipated d/c is to: Home  Patient currently is not medically stable to d/c.  Continues to have hemoptysis    Difficult to place patient No  Level of care: Telemetry Cardiac  Consultants:  pulm (phone)   Objective: Vitals:   04/13/21 0328 04/13/21 0500 04/13/21 0700 04/13/21 1124  BP: 100/79  106/81 102/73  Pulse:   85 84  Resp: 20   18  Temp: 98.5 F (36.9 C)   98.2 F (36.8 C)  TempSrc: Oral   Oral  SpO2: 96%   95%  Weight:  101.2 kg    Height:        Intake/Output Summary (Last 24 hours) at 04/13/2021 1325 Last data filed at 04/13/2021 0300 Gross per 24 hour   Intake 842.57 ml  Output 725 ml  Net 117.57 ml   Filed Weights   04/11/21 0358 04/12/21 0555 04/13/21 0500  Weight: 101.2 kg 100.6 kg 101.2 kg    Examination:  General: Appearance:    Obese male in no acute distress     Lungs:     diminished, respirations unlabored  Heart:    Normal heart rate. Normal rhythm. No murmurs, rubs, or gallops.   MS:   All extremities are intact.   Neurologic:   Awake, alert, oriented x 3. No apparent focal neurological           defect.     Data Reviewed: I have independently reviewed following labs and imaging studies   CBC: Recent Labs  Lab 04/08/21 1146 04/09/21 0627 04/10/21 0121 04/11/21 0021 04/12/21 0114 04/13/21 0146  WBC 10.2 13.0* 14.7* 15.6* 10.9* 8.5  NEUTROABS 5.3  --   --   --   --   --   HGB 14.9 15.3 14.0 13.4 12.5* 13.1  HCT 46.1 47.2 43.4 41.8 39.4 40.6  MCV 81.9 81.7 82.5 82.8 81.9 82.5  PLT 341 388 331 302 286 285   Basic Metabolic Panel: Recent Labs  Lab 04/09/21 0627 04/10/21 0121 04/11/21 0021 04/12/21 0114 04/13/21 0146  NA 131* 131* 133* 134* 133*  K 4.7 4.5 4.3 4.2 4.0  CL 101 101 101 101 102  CO2 18* 22 21* 24 21*  GLUCOSE 132* 116* 101* 106* 104*  BUN 20 18 17 17 14   CREATININE 1.46* 1.33* 1.29* 1.38* 1.14  CALCIUM 9.3 8.9 9.0 8.7* 8.1*  MG 1.9  --   --   --   --   PHOS 5.5*  --   --   --   --    Liver Function Tests: Recent Labs  Lab 04/08/21 1146 04/09/21 0627  AST 32 24  ALT 30 27  ALKPHOS 66 69  BILITOT 0.7 1.2  PROT 7.7 7.9  ALBUMIN 3.5 3.5   Coagulation Profile: No results for input(s): INR, PROTIME in the last 168 hours. HbA1C: No results for input(s): HGBA1C in the last 72 hours. CBG: No results for input(s): GLUCAP in the last 168 hours.  Recent Results (from the past 240 hour(s))  Resp Panel by RT-PCR (Flu A&B, Covid) Nasopharyngeal Swab     Status: None   Collection Time: 04/08/21  1:59 PM   Specimen: Nasopharyngeal Swab; Nasopharyngeal(NP) swabs in vial transport medium   Result Value Ref Range Status   SARS Coronavirus 2 by RT PCR NEGATIVE NEGATIVE Final    Comment: (NOTE) SARS-CoV-2 target nucleic acids are NOT DETECTED.  The SARS-CoV-2 RNA is generally detectable in upper respiratory specimens during the acute phase of infection. The lowest concentration of SARS-CoV-2 viral copies  this assay can detect is 138 copies/mL. A negative result does not preclude SARS-Cov-2 infection and should not be used as the sole basis for treatment or other patient management decisions. A negative result may occur with  improper specimen collection/handling, submission of specimen other than nasopharyngeal swab, presence of viral mutation(s) within the areas targeted by this assay, and inadequate number of viral copies(<138 copies/mL). A negative result must be combined with clinical observations, patient history, and epidemiological information. The expected result is Negative.  Fact Sheet for Patients:  BloggerCourse.com  Fact Sheet for Healthcare Providers:  SeriousBroker.it  This test is no t yet approved or cleared by the [place] FDA and  has been authorized for detection and/or diagnosis of SARS-CoV-2 by FDA under an Emergency Use Authorization (EUA). This EUA will remain  in effect (meaning this test can be used) for the duration of the COVID-19 declaration under Section 564(b)(1) of the Act, 21 U.S.C.section 360bbb-3(b)(1), unless the authorization is terminated  or revoked sooner.       Influenza A by PCR NEGATIVE NEGATIVE Final   Influenza B by PCR NEGATIVE NEGATIVE Final    Comment: (NOTE) The Xpert Xpress SARS-CoV-2/FLU/RSV plus assay is intended as an aid in the diagnosis of influenza from Nasopharyngeal swab specimens and should not be used as a sole basis for treatment. Nasal washings and aspirates are unacceptable for Xpert Xpress SARS-CoV-2/FLU/RSV testing.  Fact Sheet for  Patients: BloggerCourse.com  Fact Sheet for Healthcare Providers: SeriousBroker.it  This test is not yet approved or cleared by the [place] FDA and has been authorized for detection and/or diagnosis of SARS-CoV-2 by FDA under an Emergency Use Authorization (EUA). This EUA will remain in effect (meaning this test can be used) for the duration of the COVID-19 declaration under Section 564(b)(1) of the Act, 21 U.S.C. section 360bbb-3(b)(1), unless the authorization is terminated or revoked.  Performed at [facility], [address], [place], [place] [address]      Radiology Studies: No results found.  Marlin Canary DO [facility]  Between 7 am - 7 pm I am available, please contact me via Amion (for emergencies) or Securechat (non urgent messages)  Between 7 pm - 7 am I am not available, please contact night coverage MD/APP via Amion

## 2021-04-13 NOTE — TOC Progression Note (Signed)
Transition of Care [facility]) - Progression Note    Patient Details  Name: Andres Roman MRN: [id number] Date of Birth: 08/19/1966  [facility]) CM/SW Contact  Glennon Mac, RN Phone Number: 04/13/2021, 5:08 PM  Clinical Narrative:  Pt for possible discharge over the weekend, per MD; he needs medication assistance. Pt is uninsured, but is eligible for medication assistance through [facility] program. Pt's discharge Rx sent to [facility] pharmacy to be filled using MATCH letter. DC meds will need to be picked up at [facility] upon discharge.   Completed [employer] Patient Assistance Form and faxed to drug company with Rx.  Pt states he plans to dc home with family's assistance.  Pt has Cardiology and [facility]ness/PCP follow up appts.    Expected Discharge Plan: Home/Self Care Barriers to Discharge: Continued Medical Work up  Expected Discharge Plan and Services Expected Discharge Plan: Home/Self Care   Discharge Planning Services: Medication Assistance,MATCH Program   Living arrangements for the past 2 months: Apartment                                       Social Determinants of Health (SDOH) Interventions    Readmission Risk Interventions No flowsheet data found.  Quintella Baton, RN, BSN  Trauma/Neuro ICU Case Manager [phone]

## 2021-04-13 NOTE — Progress Notes (Addendum)
ANTICOAGULATION CONSULT NOTE  Pharmacy Consult for Heparin Indication: pulmonary embolus  No Known Allergies  Patient Measurements: Height: 5\' 7"  (170.2 cm) Weight: 101.2 kg (223 lb 1.7 oz) IBW/kg (Calculated) : 66.1 Heparin Dosing Weight: 101 kg   Vital Signs: Temp: 98.2 F (36.8 C) (06/03 1124) Temp Source: Oral (06/03 1124) BP: 102/73 (06/03 1124) Pulse Rate: 84 (06/03 1124)  Labs: Recent Labs    04/11/21 0021 04/11/21 1111 04/11/21 1925 04/12/21 0114 04/13/21 0146  HGB 13.4  --   --  12.5* 13.1  HCT 41.8  --   --  39.4 40.6  PLT 302  --   --  286 285  HEPARINUNFRC 0.24*   < > 0.36 0.45 0.50  CREATININE 1.29*  --   --  1.38* 1.14   < > = values in this interval not displayed.    Estimated Creatinine Clearance: 82.9 mL/min (by C-G formula based on SCr of 1.14 mg/dL).   Medical History: Past Medical History:  Diagnosis Date  . Acute systolic CHF (congestive heart failure) (HCC) 03/26/2021  . Hyperlipidemia   . Hypertension   . Obese     Medications:  Medications Prior to Admission  Medication Sig Dispense Refill Last Dose  . acetaminophen (TYLENOL) 325 MG tablet Take 325-650 mg by mouth every 6 (six) hours as needed (for headaches).   unknown  . carvedilol (COREG) 6.25 MG tablet Take 1 tablet (6.25 mg total) by mouth 2 (two) times daily with a meal. 60 tablet 0 04/08/2021 at 9am  . dapagliflozin propanediol (FARXIGA) 10 MG TABS tablet Take 1 tablet (10 mg total) by mouth daily. 30 tablet 0 04/08/2021 at am  . furosemide (LASIX) 20 MG tablet Take 1 tablet (20 mg total) by mouth daily as needed for fluid or edema. 30 tablet 0 couple days ago  . rosuvastatin (CRESTOR) 40 MG tablet Take 1 tablet (40 mg total) by mouth daily. 30 tablet 0 04/08/2021 at am  . sacubitril-valsartan (ENTRESTO) 49-51 MG Take 1 tablet by mouth 2 (two) times daily. 60 tablet 0 04/08/2021 at am  . spironolactone (ALDACTONE) 25 MG tablet Take 1 tablet (25 mg total) by mouth daily. 30 tablet 0  04/08/2021 at am    Assessment: Andres Roman found to have an acute PE. Pharmacy consulted to dose IV heparin. He is noted with hemoptysis and he was on transexamic acid nebs (5/31> 6/1). Plans are noted for apixaban when able to change to oral anticoagulation.   Heparin level 0.5 on heparin 1650 units/hr is therapeutic but at upper end of goal. Hemoptysis is improving.   Goal of Therapy:  Heparin level= 0.3-0.5 units/mL Monitor platelets by anticoagulation protocol: Yes   Plan:  Decrease heparin 1600 units/hr Daily CBC/heparin level Monitor for any further hemoptysis   8/1 PharmD., BCPS Clinical Pharmacist 04/13/2021 1:29 PM

## 2021-04-14 ENCOUNTER — Inpatient Hospital Stay (HOSPITAL_COMMUNITY): Payer: Self-pay

## 2021-04-14 LAB — BASIC METABOLIC PANEL
Anion gap: 9 (ref 5–15)
BUN: 11 mg/dL (ref 6–20)
CO2: 22 mmol/L (ref 22–32)
Calcium: 8.5 mg/dL — ABNORMAL LOW (ref 8.9–10.3)
Chloride: 103 mmol/L (ref 98–111)
Creatinine, Ser: 1.1 mg/dL (ref 0.61–1.24)
GFR, Estimated: >60 mL/min (ref >60)
Glucose, Bld: 96 mg/dL (ref 70–99)
Potassium: 3.9 mmol/L (ref 3.5–5.1)
Sodium: 134 mmol/L — ABNORMAL LOW (ref 135–145)

## 2021-04-14 LAB — HEPARIN LEVEL (UNFRACTIONATED): Heparin Unfractionated: 0.42 IU/mL (ref 0.30–0.70)

## 2021-04-14 LAB — CBC
HCT: 40.7 % (ref 39.0–52.0)
Hemoglobin: 13.3 g/dL (ref 13.0–17.0)
MCH: 26.6 pg (ref 26.0–34.0)
MCHC: 32.7 g/dL (ref 30.0–36.0)
MCV: 81.4 fL (ref 80.0–100.0)
Platelets: 317 10*3/uL (ref 150–400)
RBC: 5 MIL/uL (ref 4.22–5.81)
RDW: 14.7 % (ref 11.5–15.5)
WBC: 9.1 10*3/uL (ref 4.0–10.5)
nRBC: 0 % (ref 0.0–0.2)

## 2021-04-14 MED ORDER — APIXABAN 5 MG PO TABS
10.0000 mg | ORAL_TABLET | Freq: Two times a day (BID) | ORAL | Status: DC
  Administered 2021-04-14: 10 mg via ORAL
  Filled 2021-04-14: qty 2

## 2021-04-14 MED ORDER — APIXABAN 5 MG PO TABS: 5.0000 mg | ORAL_TABLET | Freq: Two times a day (BID) | ORAL | Status: DC

## 2021-04-14 MED ORDER — AZITHROMYCIN 500 MG PO TABS: 500.0000 mg | ORAL_TABLET | Freq: Every day | ORAL | 0 refills | Status: DC

## 2021-04-14 NOTE — TOC Transition Note (Signed)
Transition of Care [facility]) - CM/SW Discharge Note   Patient Details  Name: Andres Roman MRN: [id number] Date of Birth: 25-Nov-1965  [facility]) CM/SW Contact:  Bess Kinds, RN Phone Number: [phone] 04/14/2021, 12:05 PM   Clinical Narrative:     Patient to transition home today. Previous CM arranged MATCH for DC medications to be filled through [facility] pharmacy yesterday. Nursing to pick up medications from main pharmacy prior to discharge. No further TOC needs identified.   Final next level of care: Home/Self Care Barriers to Discharge: No Barriers Identified   Patient Goals and CMS Choice Patient states their goals for this hospitalization and ongoing recovery are:: to go home   Choice offered to / list presented to : NA  Discharge Placement                       Discharge Plan and Services   Discharge Planning Services: Medication Assistance,MATCH Program            DME Arranged: N/A DME Agency: NA       HH Arranged: NA HH Agency: NA        Social Determinants of Health (SDOH) Interventions     Readmission Risk Interventions No flowsheet data found.

## 2021-04-14 NOTE — Discharge Summary (Addendum)
Physician Discharge Summary  Andres Roman HQI:[id number]:[id number] DOB: [birth year] DOA: 04/08/2021  PCP: Gillian ScarceZanard, Robyn K, MD  Admit date: 04/08/2021 Discharge date: 04/14/2021  Admitted From: home Disposition:  home  Recommendations for Outpatient Follow-up:  1. Follow up with PCP in 1-2 weeks 2. Please obtain BMP/CBC in one week  Home Health: none Equipment/Devices: none  Discharge Condition: stable CODE STATUS: Full code Diet recommendation: low sodium  HPI: Per admitting MD, Andres GammonMelvin Menz is a 55 y.o. male, with PMH of hypertension, dyslipidemia, prediabetes, systolic CHF, CKD 3 A who presented to the ER on 04/08/2021 with hemoptysis. Patient was recently hospitalized 2 weeks ago for new onset acute systolic CHF.  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  He was doing well however this morning he had hemoptysis along with a burning sensation in his chest.  He also had some coughing.  He had a few more episodes, with coughing up some blood, thus he presented to outside ER for evaluation.  He denies similar symptoms in the past.  Not on any blood thinners.  He denies any significant dyspnea.  He denies any pain in bilateral lower extremities.  Denies history of blood clots in the past.  Hospital Course / Discharge diagnoses: Principal problem Acute hypoxic respiratory failure due to acute PE in the right lower lobe artery, with hemoptysis -hemoptysis is now minimal, much improved compared to on admission.  Case was discussed with Dr. Katrinka BlazingSmith with critical care and patient received tranexamic acid nebulizers with improvement in his hemoptysis.  Has not coughed up any blood in the past 24 hours.  He underwent a 2D echo on 5/30 which showed normal RV.  He has improved, he is on room air and will be discharged home in stable condition  Active problems Esophageal dysphagia -dg esophagus shows no stricture, added PPI with improvement Chronic systolic CHF-2D  echo done 2 weeks ago showed EF 25-30%, cardiac cath on 5/18 showed mild nonobstructive CAD.    Resume home medications Community-acquired pneumonia-will continue antibiotics for 2 more days on discharge Hyponatremia  -mild, monitor Leukocytosis due to PNA, improved on IV abx Elevated troponin - Likely demand due to PE Chronic kidney disease stage IIIa -Baseline creatinine 1.2-1.5, currently within his baseline Hyperlipidemia -continue statin Prediabetes -noted, A1c 6.1  Sepsis ruled out   Discharge Instructions   Allergies as of 04/14/2021   No Known Allergies     Medication List    TAKE these medications   acetaminophen 325 MG tablet Commonly known as: TYLENOL Take 325-650 mg by mouth every 6 (six) hours as needed (for headaches).   azithromycin 500 MG tablet Commonly known as: ZITHROMAX Take 1 tablet (500 mg total) by mouth daily at 6 PM.   carvedilol 6.25 MG tablet Commonly known as: COREG Take 1 tablet (6.25 mg total) by mouth 2 (two) times daily with a meal.   doxycycline 100 MG tablet Commonly known as: VIBRA-TABS Take 1 tablet (100 mg total) by mouth 2 (two) times daily.   Eliquis DVT/PE Starter Pack Generic drug: Apixaban Starter Pack (10mg  and 5mg ) Take as directed on package: start with two-5mg  tablets twice daily for 7 days. On day 8, switch to one-5mg  tablet twice daily.   Entresto 49-51 MG Generic drug: sacubitril-valsartan Take 1 tablet by mouth 2 (two) times daily.   Farxiga 10 MG Tabs tablet Generic drug: dapagliflozin propanediol Take 1 tablet (10 mg total) by mouth daily.   furosemide 20  MG tablet Commonly known as: Lasix Take 1 tablet (20 mg total) by mouth daily as needed for fluid or edema.   pantoprazole 40 MG tablet Commonly known as: PROTONIX Take 1 tablet (40 mg total) by mouth daily.   rosuvastatin 40 MG tablet Commonly known as: Crestor Take 1 tablet (40 mg total) by mouth daily.   spironolactone 25 MG tablet Commonly known as:  ALDACTONE Take 1 tablet (25 mg total) by mouth daily.      Consultations:  None   Procedures/Studies:  DG Chest 2 View  Result Date: 04/14/2021 CLINICAL DATA:  Pleural effusion EXAM: CHEST - 2 VIEW COMPARISON:  April 11, 2021 FINDINGS: There is a persistent small right pleural effusion with airspace opacity in the right lower lobe and in portions of the right middle lobe. Left lung is clear. Heart size and pulmonary vascular normal. No adenopathy. There is aortic atherosclerosis. IMPRESSION: Small right pleural effusion. Persistent areas of airspace opacity in the right lower lobe and to a lesser degree in the right middle lobe. There may be a degree of partial clearing in the right middle lobe. Left lung clear. Stable cardiac silhouette. Aortic Atherosclerosis (ICD10-I70.0). Electronically Signed   By: Bretta Bang III M.D.   On: 04/14/2021 08:10   DG Chest 2 View  Result Date: 04/11/2021 CLINICAL DATA:  Fever.  Hemoptysis. EXAM: CHEST - 2 VIEW COMPARISON:  One-view chest x-ray 5/29/2.  CT a chest 04/08/2021 FINDINGS: Heart is enlarged. Increasing right lower lobe airspace disease is present. Right pleural effusion is suspected. Right middle lobe airspace disease is also present. IMPRESSION: 1. Increasing right lower and lobe airspace disease. This may represent infection superimposed on previously suspected infarct. Additional atelectasis or infarct not excluded. 2. Right pleural effusion. 3. Cardiomegaly. Electronically Signed   By: Marin Roberts M.D.   On: 04/11/2021 16:40   DG Chest 2 View  Result Date: 03/25/2021 CLINICAL DATA:  Progressive shortness of breath and edema. EXAM: CHEST - 2 VIEW COMPARISON:  None FINDINGS: The heart is enlarged. There is mild interstitial pulmonary edema. No focal consolidations or pleural effusions. IMPRESSION: Cardiomegaly and mild interstitial edema. Electronically Signed   By: Norva Pavlov M.D.   On: 03/25/2021 08:46   CT ANGIO CHEST PE W OR  WO CONTRAST  Addendum Date: 04/08/2021   ADDENDUM REPORT: 04/08/2021 13:32 ADDENDUM: These results were called by telephone at the time of interpretation on 04/08/2021 at 1:32 pm to provider Dr. Dalene Seltzer, who verbally acknowledged these results. Electronically Signed   By: Ted Mcalpine M.D.   On: 04/08/2021 13:32   Result Date: 04/08/2021 CLINICAL DATA:  PE suspected. EXAM: CT ANGIOGRAPHY CHEST WITH CONTRAST TECHNIQUE: Multidetector CT imaging of the chest was performed using the standard protocol during bolus administration of intravenous contrast. Multiplanar CT image reconstructions and MIPs were obtained to evaluate the vascular anatomy. CONTRAST:  OMNIPAQUE IOHEXOL 350 MG/ML SOLN COMPARISON:  None. FINDINGS: Cardiovascular: Satisfactory opacification of the pulmonary arteries to the segmental level. There is an occlusive pulmonary embolus within a segmental artery in the right lower lobe. Normal heart size. No pericardial effusion. Mediastinum/Nodes: Mildly enlarged periesophageal lymph nodes. Esophagus, trachea and the thyroid gland look normal. Lungs/Pleura: Geometric area of ground-glass opacity peripherally in the superior segment of the right lower lobe in the distribution of the pulmonary embolus. Upper Abdomen: No acute abnormality. Musculoskeletal: No chest wall abnormality. No acute or significant osseous findings. Review of the MIP images confirms the above findings. IMPRESSION: 1. Occlusive  pulmonary embolus within a segmental artery in the right lower lobe. 2. Geometric area of ground-glass opacity peripherally in the superior segment of the right lower lobe in the distribution of the pulmonary embolus. This may represent an area of pulmonary infarct. 3. Mildly enlarged periesophageal lymph nodes, nonspecific. Electronically Signed: By: Ted Mcalpine M.D. On: 04/08/2021 13:27   CARDIAC CATHETERIZATION  Result Date: 03/28/2021  Prox RCA lesion is 20% stenosed.  RPAV  lesion is 20% stenosed.  Dist RCA lesion is 20% stenosed.  Ost Cx to Mid Cx lesion is 20% stenosed.  Ramus lesion is 30% stenosed.  1st Mrg lesion is 50% stenosed.  1.  Mild nonobstructive coronary artery disease. 2.  Right heart catheterization showed low filling pressures, normal pulmonary pressure and normal cardiac output.  No evidence of significant mitral regurgitation based on waveforms. Recommendations: The patient has nonischemic cardiomyopathy.  Recommend medical therapy. He seems to be mildly volume depleted and mildly hypotensive.  He was given 250 mill bolus of normal saline.  Hold furosemide today.  I also held hydralazine and Imdur for the same reason and elected to add small dose carvedilol 3.125 mg twice daily.  He is already on [place] and spironolactone.   DG Chest Port 1 View  Result Date: 04/08/2021 CLINICAL DATA:  55 year old male with clinical concern for potential fluid overload. EXAM: PORTABLE CHEST 1 VIEW COMPARISON:  Chest x-ray 03/25/2021. FINDINGS: Opacity in the periphery of the right lung base obscuring the lateral aspect of the right hemidiaphragm. Left lung is clear. No definite pleural effusions. No evidence of pulmonary edema. No pneumothorax. Heart size is normal. Upper mediastinal contours are within normal limits. Aortic atherosclerosis. IMPRESSION: 1. Ill-defined opacity in the periphery of the right lung base concerning for pneumonia. Followup PA and lateral chest X-ray is recommended in 3-4 weeks following trial of antibiotic therapy to ensure resolution and exclude underlying malignancy. 2. Aortic atherosclerosis. 3. No findings to suggest pulmonary edema or fluid overload. Electronically Signed   By: Trudie Reed M.D.   On: 04/08/2021 12:32   ECHOCARDIOGRAM COMPLETE  Result Date: 04/09/2021    ECHOCARDIOGRAM REPORT   Patient Name:   Brennen Boomhower Date of Exam: 04/09/2021 Medical Rec #:  [id number]     Height:       67.0 in Accession #:    [id number]     Weight:       222.7 lb Date of Birth:  1966-10-17     BSA:          2.117 m Patient Age:    55 years      BP:           118/93 mmHg Patient Gender: M             HR:           94 bpm. Exam Location:  Inpatient Procedure: 2D Echo, Cardiac Doppler and Color Doppler Indications:    Pulmonary Embolus I26.09  History:        Patient has prior history of Echocardiogram examinations, most                 recent 03/28/2021. CHF; Risk Factors:Hypertension and                 Dyslipidemia.  Sonographer:    Eulah Pont RDCS Referring Phys: [id number] Kieth Brightly MACNEIL IMPRESSIONS  1. MR appears to be mild on present study compared to previous though not well interrogated.  2. Left  ventricular ejection fraction, by estimation, is 25 to 30%. The left ventricle has severely decreased function. The left ventricle demonstrates global hypokinesis. The left ventricular internal cavity size was moderately dilated. Left ventricular diastolic parameters are consistent with Grade II diastolic dysfunction (pseudonormalization).  3. Right ventricular systolic function is normal. The right ventricular size is normal.  4. The mitral valve is normal in structure. Mild mitral valve regurgitation. No evidence of mitral stenosis.  5. The aortic valve is tricuspid. Aortic valve regurgitation is mild. No aortic stenosis is present.  6. The inferior vena cava is normal in size with greater than 50% respiratory variability, suggesting right atrial pressure of 3 mmHg. FINDINGS  Left Ventricle: Left ventricular ejection fraction, by estimation, is 25 to 30%. The left ventricle has severely decreased function. The left ventricle demonstrates global hypokinesis. The left ventricular internal cavity size was moderately dilated. There is no left ventricular hypertrophy. Left ventricular diastolic parameters are consistent with Grade II diastolic dysfunction (pseudonormalization). Right Ventricle: The right ventricular size is normal. Right ventricular  systolic function is normal. Left Atrium: Left atrial size was normal in size. Right Atrium: Right atrial size was normal in size. Pericardium: There is no evidence of pericardial effusion. Mitral Valve: The mitral valve is normal in structure. Mild mitral valve regurgitation. No evidence of mitral valve stenosis. Tricuspid Valve: The tricuspid valve is normal in structure. Tricuspid valve regurgitation is trivial. No evidence of tricuspid stenosis. Aortic Valve: The aortic valve is tricuspid. Aortic valve regurgitation is mild. No aortic stenosis is present. Pulmonic Valve: The pulmonic valve was normal in structure. Pulmonic valve regurgitation is not visualized. No evidence of pulmonic stenosis. Aorta: The aortic root is normal in size and structure. Venous: The inferior vena cava is normal in size with greater than 50% respiratory variability, suggesting right atrial pressure of 3 mmHg. IAS/Shunts: No atrial level shunt detected by color flow Doppler. Additional Comments: MR appears to be mild on present study compared to previous though not well interrogated.  LEFT VENTRICLE PLAX 2D LVIDd:         5.90 cm      Diastology LVIDs:         4.80 cm      LV e' medial:    6.83 cm/s LV PW:         1.40 cm      LV E/e' medial:  6.7 LV IVS:        1.10 cm      LV e' lateral:   6.12 cm/s LVOT diam:     2.40 cm      LV E/e' lateral: 7.5 LV SV:         58 LV SV Index:   28 LVOT Area:     4.52 cm  LV Volumes (MOD) LV vol d, MOD A2C: 170.0 ml LV vol d, MOD A4C: 132.0 ml LV vol s, MOD A2C: 104.0 ml LV vol s, MOD A4C: 83.0 ml LV SV MOD A2C:     66.0 ml LV SV MOD A4C:     132.0 ml LV SV MOD BP:      56.0 ml RIGHT VENTRICLE RV S prime:     10.60 cm/s TAPSE (M-mode): 2.2 cm LEFT ATRIUM             Index       RIGHT ATRIUM           Index LA diam:        4.40 cm  2.08 cm/m  RA Area:     12.20 cm LA Vol (A2C):   61.9 ml 29.24 ml/m RA Volume:   24.80 ml  11.72 ml/m LA Vol (A4C):   55.5 ml 26.22 ml/m LA Biplane Vol: 63.5 ml 30.00  ml/m  AORTIC VALVE LVOT Vmax:   85.90 cm/s LVOT Vmean:  54.100 cm/s LVOT VTI:    0.129 m  AORTA Ao Root diam: 3.50 cm Ao Asc diam:  3.40 cm MITRAL VALVE MV Area (PHT): 3.37 cm    SHUNTS MV Decel Time: 225 msec    Systemic VTI:  0.13 m MV E velocity: 45.70 cm/s  Systemic Diam: 2.40 cm MV A velocity: 59.10 cm/s MV E/A ratio:  0.77 Olga Millers MD Electronically signed by Olga Millers MD Signature Date/Time: 04/09/2021/9:56:16 AM    Final    ECHOCARDIOGRAM COMPLETE  Result Date: 03/26/2021    ECHOCARDIOGRAM REPORT   Patient Name:   Wael Ballentine Date of Exam: 03/26/2021 Medical Rec #:  [id number]     Height:       67.0 in Accession #:    [id number]    Weight:       231.6 lb Date of Birth:  03/06/1966     BSA:          2.152 m Patient Age:    55 years      BP:           131/94 mmHg Patient Gender: M             HR:           100 bpm. Exam Location:  Inpatient Procedure: 2D Echo, Cardiac Doppler and Color Doppler Indications:    CHF  History:        Patient has no prior history of Echocardiogram examinations.                 Risk Factors:Diabetes and Hypertension.  Sonographer:    Neomia Dear RDCS Referring Phys: [id number] KUBER GHIMIRE IMPRESSIONS  1. Endocardium is difficult to visualize. 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 Would recomm limited echo with Definity to confirm. . Left ventricular ejection fraction, by estimation, is 45%. The left ventricle has mildly decreased function. The left ventricular internal cavity size was moderately dilated. Left ventricular diastolic parameters are indeterminate.  2. Right ventricular systolic function is normal. The right ventricular size is normal. There is normal pulmonary artery systolic pressure.  3. Left atrial size was severely dilated.  4. Right atrial size was moderately dilated.  5. MR is eccentric, directed posteriorly to back of left atrium . The mitral valve is normal in structure. Moderate to severe  mitral valve regurgitation.  6. The aortic valve is tricuspid. Aortic valve regurgitation is mild. Mild aortic valve sclerosis is present, with no evidence of aortic valve stenosis. FINDINGS  Left Ventricle: Endocardium is difficult to visualize. 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 Would recomm limited echo with Definity to confirm. Left ventricular ejection fraction, by estimation, is 45%. The left ventricle has mildly decreased function. The left ventricular internal cavity size was moderately dilated. There is no left ventricular hypertrophy. Left ventricular diastolic parameters are indeterminate. Right Ventricle: The right ventricular size is normal. No increase in right ventricular wall thickness. Right ventricular systolic function is normal. There is normal pulmonary artery systolic pressure. The tricuspid regurgitant velocity is 2.84 m/s, and  with an assumed right  atrial pressure of 3 mmHg, the estimated right ventricular systolic pressure is 35.3 mmHg. Left Atrium: Left atrial size was severely dilated. Right Atrium: Right atrial size was moderately dilated. Pericardium: There is no evidence of pericardial effusion. Mitral Valve: MR is eccentric, directed posteriorly to back of left atrium. The mitral valve is normal in structure. Moderate to severe mitral valve regurgitation. Tricuspid Valve: The tricuspid valve is normal in structure. Tricuspid valve regurgitation is mild. Aortic Valve: The aortic valve is tricuspid. Aortic valve regurgitation is mild. Mild aortic valve sclerosis is present, with no evidence of aortic valve stenosis. Aortic valve mean gradient measures 4.0 mmHg. Aortic valve peak gradient measures 6.2 mmHg. Aortic valve area, by VTI measures 2.37 cm. Pulmonic Valve: The pulmonic valve was normal in structure. Pulmonic valve regurgitation is mild. Aorta: The aortic root and ascending aorta are structurally normal, with no  evidence of dilitation. IAS/Shunts: No atrial level shunt detected by color flow Doppler.  LEFT VENTRICLE PLAX 2D LVIDd:         5.90 cm      Diastology LVIDs:         5.00 cm      LV e' medial:    8.05 cm/s LV PW:         1.10 cm      LV E/e' medial:  11.4 LV IVS:        0.90 cm      LV e' lateral:   10.40 cm/s LVOT diam:     2.25 cm      LV E/e' lateral: 8.9 LV SV:         47 LV SV Index:   22 LVOT Area:     3.98 cm  LV Volumes (MOD) LV vol d, MOD A2C: 157.0 ml LV vol d, MOD A4C: 138.0 ml LV vol s, MOD A2C: 125.0 ml LV vol s, MOD A4C: 93.0 ml LV SV MOD A2C:     32.0 ml LV SV MOD A4C:     138.0 ml LV SV MOD BP:      39.3 ml RIGHT VENTRICLE RV S prime:     14.80 cm/s TAPSE (M-mode): 1.8 cm LEFT ATRIUM              Index       RIGHT ATRIUM           Index LA diam:        5.20 cm  2.42 cm/m  RA Area:     26.10 cm LA Vol (A2C):   95.1 ml  44.18 ml/m RA Volume:   97.30 ml  45.21 ml/m LA Vol (A4C):   149.0 ml 69.23 ml/m LA Biplane Vol: 121.0 ml 56.22 ml/m  AORTIC VALVE AV Area (Vmax):    2.61 cm AV Area (Vmean):   2.36 cm AV Area (VTI):     2.37 cm AV Vmax:           125.00 cm/s AV Vmean:          91.200 cm/s AV VTI:            0.198 m AV Peak Grad:      6.2 mmHg AV Mean Grad:      4.0 mmHg LVOT Vmax:         82.20 cm/s LVOT Vmean:        54.200 cm/s LVOT VTI:          0.118 m LVOT/AV VTI ratio: 0.60  AORTA Ao  Root diam: 3.30 cm Ao Asc diam:  3.00 cm MR Peak grad:    114.9 mmHg  TRICUSPID VALVE MR Mean grad:    74.0 mmHg   TR Peak grad:   32.3 mmHg MR Vmax:         536.00 cm/s TR Vmax:        284.00 cm/s MR Vmean:        399.0 cm/s MR PISA:         0.57 cm    SHUNTS MR PISA Eff ROA: 3 mm       Systemic VTI:  0.12 m MR PISA Radius:  0.30 cm     Systemic Diam: 2.25 cm MV E velocity: 92.10 cm/s Dietrich Pates MD Electronically signed by Dietrich Pates MD Signature Date/Time: 03/26/2021/7:18:22 PM    Final    VAS [place] LOWER EXTREMITY VENOUS (DVT)  Result Date: 04/11/2021  Lower Venous DVT Study Patient Name:  NIKITAS DAVTYAN  Date of Exam:   04/09/2021 Medical Rec #: [id number]      Accession #:    [id number] Date of Birth: 1966/05/13      Patient Gender: M Patient Age:   55Y Exam Location:  [facility] Procedure:      VAS [place] LOWER EXTREMITY VENOUS (DVT) Referring Phys: [id number] Kieth Brightly MACNEIL --------------------------------------------------------------------------------  Indications: Pulmonary embolism.  Comparison Study: No previous exams. Performing Technologist: Ernestene Mention  Examination Guidelines: A complete evaluation includes B-mode imaging, spectral Doppler, color Doppler, and power Doppler as needed of all accessible portions of each vessel. Bilateral testing is considered an integral part of a complete examination. Limited examinations for reoccurring indications may be performed as noted. The reflux portion of the exam is performed with the patient in reverse Trendelenburg.  +---------+---------------+---------+-----------+----------+--------------+ RIGHT    CompressibilityPhasicitySpontaneityPropertiesThrombus Aging +---------+---------------+---------+-----------+----------+--------------+ CFV      Full           Yes      Yes                                 +---------+---------------+---------+-----------+----------+--------------+ SFJ      Full                                                        +---------+---------------+---------+-----------+----------+--------------+ FV Prox  Full           Yes      Yes                                 +---------+---------------+---------+-----------+----------+--------------+ FV Mid   Full           Yes      Yes                                 +---------+---------------+---------+-----------+----------+--------------+ FV DistalFull           Yes      Yes                                 +---------+---------------+---------+-----------+----------+--------------+ PFV      Full                                                         +---------+---------------+---------+-----------+----------+--------------+  POP      Full           Yes      Yes                                 +---------+---------------+---------+-----------+----------+--------------+ PTV      Full                                                        +---------+---------------+---------+-----------+----------+--------------+ PERO     Full                                                        +---------+---------------+---------+-----------+----------+--------------+   +---------+---------------+---------+-----------+----------+-------------------+ LEFT     CompressibilityPhasicitySpontaneityPropertiesThrombus Aging      +---------+---------------+---------+-----------+----------+-------------------+ CFV      Full           Yes      Yes                                      +---------+---------------+---------+-----------+----------+-------------------+ SFJ      Full                                                             +---------+---------------+---------+-----------+----------+-------------------+ FV Prox  Full           Yes      Yes                                      +---------+---------------+---------+-----------+----------+-------------------+ FV Mid   Full           Yes      Yes                                      +---------+---------------+---------+-----------+----------+-------------------+ FV DistalFull           Yes      Yes                                      +---------+---------------+---------+-----------+----------+-------------------+ PFV      Full                                                             +---------+---------------+---------+-----------+----------+-------------------+ POP      Full           Yes      Yes                                      +---------+---------------+---------+-----------+----------+-------------------+  PTV      Full                                          Not well visualized +---------+---------------+---------+-----------+----------+-------------------+ PERO     Full                                         Not well visualized +---------+---------------+---------+-----------+----------+-------------------+     *See table(s) above for measurements and observations. Electronically signed by Coral Else MD on 04/11/2021 at 10:23:02 PM.    Final    ECHOCARDIOGRAM LIMITED  Result Date: 03/28/2021    ECHOCARDIOGRAM LIMITED REPORT   Patient Name:   Jakeb Juneau Date of Exam: 03/28/2021 Medical Rec #:  [id number]     Height:       67.0 in Accession #:    [id number]    Weight:       230.0 lb Date of Birth:  Mar 06, 1966     BSA:          2.146 m Patient Age:    55 years      BP:           129/106 mmHg Patient Gender: M             HR:           102 bpm. Exam Location:  Inpatient Procedure: Limited Echo and Intracardiac Opacification Agent Indications:    CHF-Acute Systolic  History:        Patient has prior history of Echocardiogram examinations, most                 recent 03/26/2021. CHF; Risk Factors:Diabetes and Hypertension.  Sonographer:    Ross Ludwig RDCS (AE) Referring Phys: [id number] RHONDA G BARRETT IMPRESSIONS  1. Limited study: Left ventricular ejection fraction, by estimation, is 25 to 30%. The left ventricle has severely decreased function. The left ventricle demonstrates global hypokinesis. LV myocardium is thin. No LV thrombus noted. Comparison(s): A prior study was performed on 03/26/21. Prior images reviewed side by side. LV function appears further diminished; though present study done with contrast. FINDINGS  Left Ventricle: Left ventricular ejection fraction, by estimation, is 25 to 30%. The left ventricle has severely decreased function. The left ventricle demonstrates global hypokinesis. Definity contrast agent was given IV to delineate the left ventricular endocardial borders. Riley Lam MD Electronically signed  by Riley Lam MD Signature Date/Time: 03/28/2021/3:54:11 PM    Final    DG ESOPHAGUS W DOUBLE CM (HD)  Result Date: 04/11/2021 CLINICAL DATA:  Fever and hemoptysis with dysphagia and globus sensation. EXAM: ESOPHOGRAM / BARIUM SWALLOW / BARIUM TABLET STUDY TECHNIQUE: Combined double contrast and single contrast examination performed using effervescent crystals, thick barium liquid, and thin barium liquid. The patient was observed with fluoroscopy swallowing a 13 mm barium sulphate tablet. FLUOROSCOPY TIME:  Fluoroscopy Time:  2 minutes 18 seconds Radiation Exposure Index (if provided by the fluoroscopic device): 38 mGy Number of Acquired Spot Images: 2 COMPARISON:  Chest CT of Apr 08, 2021. FINDINGS: Thin barium was swallowed without signs of gross aspiration or penetration initially. High-density barium was then administered following effervescent crystals showing normal distensibility and caliber of the esophagus. Mild fold thickening. 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 Prone RAO position with single  swallow showed intact primary wave without significant dysmotility. Small Schatzki's ring approximately 13 mm. Barium tablet passed without difficulty. No sign of gastroesophageal reflux observed during the evaluation. IMPRESSION: Normal motility, small Schatzki's ring suggested but without obstruction upon swallowing of barium tablet. Mild fold thickening may indicate mild esophagitis. Electronically Signed   By: Donzetta Kohut M.D.   On: 04/11/2021 16:06      Subjective: - no chest pain, shortness of breath, no abdominal pain, nausea or vomiting.   Discharge Exam: BP (!) 117/93 (BP Location: Right Arm)   Pulse 90   Temp 97.9 F (36.6 C) (Oral)   Resp 16   Ht 5\' 7"  (1.702 m)   Wt 101.6 kg   SpO2 96%   BMI 35.08 kg/m   General: Pt is alert, awake, not in acute distress Cardiovascular: RRR, S1/S2 +, no rubs, no gallops Respiratory: CTA bilaterally, no wheezing, no  rhonchi Abdominal: Soft, NT, ND, bowel sounds + Extremities: no edema, no cyanosis   The results of significant diagnostics from this hospitalization (including imaging, microbiology, ancillary and laboratory) are listed below for reference.     Microbiology: Recent Results (from the past 240 hour(s))  Resp Panel by RT-PCR (Flu A&B, Covid) Nasopharyngeal Swab     Status: None   Collection Time: 04/08/21  1:59 PM   Specimen: Nasopharyngeal Swab; Nasopharyngeal(NP) swabs in vial transport medium  Result Value Ref Range Status   SARS Coronavirus 2 by RT PCR NEGATIVE NEGATIVE Final    Comment: (NOTE) SARS-CoV-2 target nucleic acids are NOT DETECTED.  The SARS-CoV-2 RNA is generally detectable in upper respiratory specimens during the acute phase of infection. The lowest concentration of SARS-CoV-2 viral copies this assay can detect is 138 copies/mL. A negative result does not preclude SARS-Cov-2 infection and should not be used as the sole basis for treatment or other patient management decisions. A negative result may occur with  improper specimen collection/handling, submission of specimen other than nasopharyngeal swab, presence of viral mutation(s) within the areas targeted by this assay, and inadequate number of viral copies(<138 copies/mL). A negative result must be combined with clinical observations, patient history, and epidemiological information. The expected result is Negative.  Fact Sheet for Patients:  04/10/21  Fact Sheet for Healthcare Providers:  BloggerCourse.com  This test is no t yet approved or cleared by the [employer].[place] FDA and  has been authorized for detection and/or diagnosis of SARS-CoV-2 by FDA under an Emergency Use Authorization (EUA). This EUA will remain  in effect (meaning this test can be used) for the duration of the COVID-19 declaration under Section 564(b)(1) of the Act,  21 U.S.C.section 360bbb-3(b)(1), unless the authorization is terminated  or revoked sooner.       Influenza A by PCR NEGATIVE NEGATIVE Final   Influenza B by PCR NEGATIVE NEGATIVE Final    Comment: (NOTE) The Xpert Xpress SARS-CoV-2/FLU/RSV plus assay is intended as an aid in the diagnosis of influenza from Nasopharyngeal swab specimens and should not be used as a sole basis for treatment. Nasal washings and aspirates are unacceptable for Xpert Xpress SARS-CoV-2/FLU/RSV testing.  Fact Sheet for Patients: [place]  Fact Sheet for Healthcare Providers: BloggerCourse.com  This test is not yet approved or cleared by the [employer]ious[employer].[place] FDA and has been authorized for detection and/or diagnosis of SARS-CoV-2 by FDA under an Emergency Use Authorization (EUA). This EUA will remain in effect (meaning this test can be used) for the duration of the COVID-19 declaration under Section 564(b)(1)  of the Act, 21 U.S.C. section 360bbb-3(b)(1), unless the authorization is terminated or revoked.  Performed at [facility], [address], [place], [place] [address]      Labs: Basic Metabolic Panel: Recent Labs  Lab 04/09/21 [id number] 04/10/21 0121 04/11/21 0021 04/12/21 0114 04/13/21 0146 04/14/21 0052  NA 131* 131* 133* 134* 133* 134*  K 4.7 4.5 4.3 4.2 4.0 3.9  CL 101 101 101 101 102 103  CO2 18* 22 21* 24 21* 22  GLUCOSE 132* 116* 101* 106* 104* 96  BUN 20 18 17 17 14 11   CREATININE 1.46* 1.33* 1.29* 1.38* 1.14 1.10  CALCIUM 9.3 8.9 9.0 8.7* 8.1* 8.5*  MG 1.9  --   --   --   --   --   PHOS 5.5*  --   --   --   --   --    Liver Function Tests: Recent Labs  Lab 04/08/21 1146 04/09/21 0627  AST 32 24  ALT 30 27  ALKPHOS 66 69  BILITOT 0.7 1.2  PROT 7.7 7.9  ALBUMIN 3.5 3.5   CBC: Recent Labs  Lab 04/08/21 1146 04/09/21 0627 04/10/21 0121 04/11/21 0021 04/12/21 0114 04/13/21 0146 04/14/21 0052  WBC 10.2    < > 14.7* 15.6* 10.9* 8.5 9.1  NEUTROABS 5.3  --   --   --   --   --   --   HGB 14.9   < > 14.0 13.4 12.5* 13.1 13.3  HCT 46.1   < > 43.4 41.8 39.4 40.6 40.7  MCV 81.9   < > 82.5 82.8 81.9 82.5 81.4  PLT 341   < > 331 302 286 285 317   < > = values in this interval not displayed.   CBG: No results for input(s): GLUCAP in the last 168 hours. Hgb A1c No results for input(s): HGBA1C in the last 72 hours. Lipid Profile No results for input(s): CHOL, HDL, LDLCALC, TRIG, CHOLHDL, LDLDIRECT in the last 72 hours. Thyroid function studies No results for input(s): TSH, T4TOTAL, T3FREE, THYROIDAB in the last 72 hours.  Invalid input(s): FREET3 Urinalysis No results found for: COLORURINE, APPEARANCEUR, LABSPEC, PHURINE, GLUCOSEU, HGBUR, BILIRUBINUR, KETONESUR, PROTEINUR, UROBILINOGEN, NITRITE, LEUKOCYTESUR  FURTHER DISCHARGE INSTRUCTIONS:   Get Medicines reviewed and adjusted: Please take all your medications with you for your next visit with your Primary MD   Laboratory/radiological data: Please request your Primary MD to go over all hospital tests and procedure/radiological results at the follow up, please ask your Primary MD to get all Hospital records sent to his/her office.   In some cases, they will be blood work, cultures and biopsy results pending at the time of your discharge. Please request that your primary care M.D. goes through all the records of your hospital data and follows up on these results.   Also Note the following: If you experience worsening of your admission symptoms, develop shortness of breath, life threatening emergency, suicidal or homicidal thoughts you must seek medical attention immediately by calling 911 or calling your MD immediately  if symptoms less severe.   You must read complete instructions/literature along with all the possible adverse reactions/side effects for all the Medicines you take and that have been prescribed to you. Take any new Medicines after  you have completely understood and accpet all the possible adverse reactions/side effects.    Do not drive when taking Pain medications or sleeping medications (Benzodaizepines)   Do not take more than prescribed  Pain, Sleep and Anxiety Medications. It is not advisable to combine anxiety,sleep and pain medications without talking with your primary care practitioner   Special Instructions: If you have smoked or chewed Tobacco  in the last 2 yrs please stop smoking, stop any regular Alcohol  and or any Recreational drug use.   Wear Seat belts while driving.   Please note: You were cared for by a hospitalist during your hospital stay. Once you are discharged, your primary care physician will handle any further medical issues. Please note that NO REFILLS for any discharge medications will be authorized once you are discharged, as it is imperative that you return to your primary care physician (or establish a relationship with a primary care physician if you do not have one) for your post hospital discharge needs so that they can reassess your need for medications and monitor your lab values.  Time coordinating discharge: 35 minutes  SIGNED:  Pamella Pert, MD, PhD 04/14/2021, 9:18 AM

## 2021-04-14 NOTE — Plan of Care (Signed)

## 2021-04-26 ENCOUNTER — Telehealth (HOSPITAL_COMMUNITY): Payer: Self-pay

## 2021-04-26 ENCOUNTER — Other Ambulatory Visit (HOSPITAL_COMMUNITY): Payer: Self-pay

## 2021-04-26 NOTE — Telephone Encounter (Signed)
Transitions of Care Pharmacy   Call attempted for a pharmacy transitions of care follow-up. HIPAA appropriate voicemail was left with call back information provided.   Call attempt #2. Will follow-up in 2-3 days.    

## 2021-04-27 ENCOUNTER — Telehealth (HOSPITAL_COMMUNITY): Payer: Self-pay | Admitting: Pharmacist

## 2021-04-27 ENCOUNTER — Other Ambulatory Visit (HOSPITAL_COMMUNITY): Payer: Self-pay

## 2021-04-27 NOTE — Telephone Encounter (Signed)
Unable to reach patient, final attempt. 

## 2021-05-06 NOTE — Progress Notes (Deleted)
[facility]  Cardiology Office Note:   Date:  05/06/2021  NAME:  Andres Roman    MRN: [id number] DOB:  [birth year]   PCP:  Gillian Scarce, MD  Cardiologist:  None  Electrophysiologist:  None   Referring MD: Gillian Scarce, MD   No chief complaint on file. ***  History of Present Illness:   Andres Roman is a 55 y.o. male with a hx of systolic heart failure (EF 25-30%), nonobstructive CAD, hypertension, hyperlipidemia, recent PE who presents for follow-up.  He was seen in the hospital in May 2022 for new onset systolic heart failure.  Left heart cath showed nonobstructive CAD.  This was attributed to uncontrolled hypertension.  Readmitted nearly 2 weeks after that stay.  Found to have an acute PE with hemoptysis.  Problem List Systolic HF -EF 25-30% 03/2021 -2/2 HTN Non-obstructive CAD -LHC 03/28/2021 -50% OM1 -20% RCA HTN HLD -T chol 191, HDL 32, LDL 140, TG 93 5. PE  -RLL segmental artery 04/08/2021 6. GERD  Past Medical History: Past Medical History:  Diagnosis Date   Acute systolic CHF (congestive heart failure) (HCC) 03/26/2021   Hyperlipidemia    Hypertension    Obese     Past Surgical History: Past Surgical History:  Procedure Laterality Date   APPENDECTOMY     RIGHT/LEFT HEART CATH AND CORONARY ANGIOGRAPHY N/A 03/28/2021   Procedure: RIGHT/LEFT HEART CATH AND CORONARY ANGIOGRAPHY;  Surgeon: Iran Ouch, MD;  Location: [facility]AS[facility];  Service: Cardiovascular;  Laterality: N/A;    Current Medications: No outpatient medications have been marked as taking for the 05/07/21 encounter (Appointment) with O'Neal, Ronnald Ramp, MD.     Allergies:    Patient has no known allergies.   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    Comment: part time  Tobacco Use   Smoking status: Never   Smokeless tobacco: Never  Vaping Use   Vaping Use: Never used  Substance and Sexual Activity   Alcohol use: No   Drug use: No   Sexual activity: Yes    Partners: Female    Birth control/protection: None  Other Topics Concern   Not on file  Social History Narrative   Marital Status:  Separated Marketing executive)    Children:  Daughters (3) Step Son (1)    Pets: None    Living Situation: Lives alone   Occupation:  Naval architect (Occupational psychologist); Part-Time Health and safety inspector)   Education:  Environmental consultant    Tobacco Use/Exposure:  None    Alcohol Use:  None    Drug Use:  None   Diet:  Regular   Exercise:  He does not do formal exercise but his jobs are very physical.     Hobbies:  Basketball                 Social Determinants of Health   Financial Resource Strain: High Risk   Difficulty of Paying Living Expenses: Hard  Food Insecurity: No Food Insecurity   Worried About Programme researcher, broadcasting/film/video in the Last Year: Never true   Ran Out of Food in the Last Year: Never true  Transportation Needs: No  Transportation Needs   Lack of Transportation (Medical): No   Lack of Transportation (Non-Medical): No  Physical Activity: Not on file  Stress: Not on file  Social Connections: Not on file     Family History: The patient's ***family history includes Diabetes in his brother and mother; Heart disease in his paternal grandmother; Hypertension in his brother and mother; Stroke in his mother.  ROS:   All other ROS reviewed and negative. Pertinent positives noted in the HPI.     EKGs/Labs/Other Studies Reviewed:   The following studies were personally reviewed by me today:  EKG:  EKG is *** ordered today.  The ekg ordered today demonstrates ***, and was personally reviewed by me.   TTE 04/09/2021  1. MR appears to be mild on present study compared to previous though  not  well interrogated.   2. Left ventricular ejection fraction, by estimation, is 25 to 30%. The  left ventricle has severely decreased function. The left ventricle  demonstrates global hypokinesis. The left ventricular internal cavity size  was moderately dilated. Left  ventricular diastolic parameters are consistent with Grade II diastolic  dysfunction (pseudonormalization).   3. Right ventricular systolic function is normal. The right ventricular  size is normal.   4. The mitral valve is normal in structure. Mild mitral valve  regurgitation. No evidence of mitral stenosis.   5. The aortic valve is tricuspid. Aortic valve regurgitation is mild. No  aortic stenosis is present.   6. The inferior vena cava is normal in size with greater than 50%  respiratory variability, suggesting right atrial pressure of 3 mmHg.   LHC 03/28/2021  Prox RCA lesion is 20% stenosed. RPAV lesion is 20% stenosed. Dist RCA lesion is 20% stenosed. Ost Cx to Mid Cx lesion is 20% stenosed. Ramus lesion is 30% stenosed. 1st Mrg lesion is 50% stenosed.   1.  Mild nonobstructive coronary artery disease. 2.  Right heart catheterization showed low filling pressures, normal pulmonary pressure and normal cardiac output.  No evidence of significant mitral regurgitation based on waveforms.  Recent Labs: 04/08/2021: B Natriuretic Peptide 172.7 04/09/2021: ALT 27; Magnesium 1.9 04/14/2021: BUN 11; Creatinine, Ser 1.10; Hemoglobin 13.3; Platelets 317; Potassium 3.9; Sodium 134   Recent Lipid Panel    Component Value Date/Time   CHOL 191 03/26/2021 0919   TRIG 93 03/26/2021 0919   HDL 32 (L) 03/26/2021 0919   CHOLHDL 6.0 03/26/2021 0919   VLDL 19 03/26/2021 0919   LDLCALC 140 (H) 03/26/2021 0919    Physical Exam:   VS:  There were no vitals taken for this visit.   Wt Readings from Last 3 Encounters:  04/14/21 223 lb 15.8 oz (101.6 kg)  04/03/21 228 lb 4 oz (103.5 kg)  03/29/21 224 lb 11.2 oz (101.9 kg)     General: Well nourished, well developed, in no acute distress Head: Atraumatic, normal size  Eyes: PEERLA, EOMI  Neck: Supple, no JVD Endocrine: No thryomegaly Cardiac: Normal S1, S2; RRR; no murmurs, rubs, or gallops Lungs: Clear to auscultation bilaterally, no wheezing, rhonchi or rales  Abd: Soft, nontender, no hepatomegaly  Ext: No edema, pulses 2+ Musculoskeletal: No deformities, BUE and BLE strength normal and equal Skin: Warm and dry, no rashes   Neuro: Alert and oriented to person, place, time, and situation, CNII-XII grossly intact, no focal deficits  Psych: Normal mood and affect   ASSESSMENT:   Andres Roman is a 55 y.o. male who presents for the following: No diagnosis found.  PLAN:   There are no diagnoses linked to this encounter.  {Are you ordering a CV Procedure (e.g. stress test, cath, DCCV, TEE, etc)?   Press F2        :[id number]}  Disposition: No follow-ups on file.  Medication Adjustments/Labs and Tests Ordered: Current medicines are reviewed at length with the patient today.  Concerns regarding medicines are outlined above.  No orders of the defined types were placed in this encounter.  No orders of the defined types were placed in this encounter.   There are no Patient Instructions on file for this visit.   Time Spent with Patient: I have spent a total of *** minutes with patient reviewing hospital notes, telemetry, EKGs, labs and examining the patient as well as establishing an assessment and plan that was discussed with the patient.  > 50% of time was spent in direct patient care.  Signed, Lenna Gilford. Flora Lipps, MD, [facility]  [address], [address] [place], [place] [address] [phone]  05/06/2021 5:21 PM

## 2021-05-07 ENCOUNTER — Ambulatory Visit (HOSPITAL_BASED_OUTPATIENT_CLINIC_OR_DEPARTMENT_OTHER): Payer: Self-pay | Admitting: Cardiovascular Disease

## 2021-05-21 ENCOUNTER — Encounter: Payer: Self-pay | Admitting: Nurse Practitioner

## 2021-05-21 ENCOUNTER — Other Ambulatory Visit: Payer: Self-pay

## 2021-05-21 ENCOUNTER — Ambulatory Visit: Payer: Self-pay | Attending: Nurse Practitioner | Admitting: Nurse Practitioner

## 2021-05-21 DIAGNOSIS — Z1211 Encounter for screening for malignant neoplasm of colon: Secondary | ICD-10-CM

## 2021-05-21 DIAGNOSIS — E785 Hyperlipidemia, unspecified: Secondary | ICD-10-CM

## 2021-05-21 DIAGNOSIS — Z125 Encounter for screening for malignant neoplasm of prostate: Secondary | ICD-10-CM

## 2021-05-21 DIAGNOSIS — R7303 Prediabetes: Secondary | ICD-10-CM

## 2021-05-21 DIAGNOSIS — Z7689 Persons encountering health services in other specified circumstances: Secondary | ICD-10-CM

## 2021-05-21 DIAGNOSIS — K219 Gastro-esophageal reflux disease without esophagitis: Secondary | ICD-10-CM

## 2021-05-21 DIAGNOSIS — E871 Hypo-osmolality and hyponatremia: Secondary | ICD-10-CM

## 2021-05-21 DIAGNOSIS — I2699 Other pulmonary embolism without acute cor pulmonale: Secondary | ICD-10-CM

## 2021-05-21 DIAGNOSIS — I5021 Acute systolic (congestive) heart failure: Secondary | ICD-10-CM

## 2021-05-21 MED ORDER — PANTOPRAZOLE SODIUM 40 MG PO TBEC
40.0000 mg | DELAYED_RELEASE_TABLET | Freq: Every day | ORAL | 1 refills | Status: AC
Start: 2021-05-21 — End: 2022-05-23
  Filled 2021-05-21 – 2021-05-31 (×2): qty 30, 30d supply, fill #0
  Filled 2021-07-19: qty 30, 30d supply, fill #1
  Filled 2021-09-06: qty 30, 30d supply, fill #2
  Filled 2021-10-31: qty 30, 30d supply, fill #3
  Filled 2022-01-11: qty 30, 30d supply, fill #0

## 2021-05-21 MED ORDER — FUROSEMIDE 20 MG PO TABS
20.0000 mg | ORAL_TABLET | Freq: Every day | ORAL | 3 refills | Status: DC | PRN
  Filled 2021-05-21 – 2021-05-31 (×2): qty 30, 30d supply, fill #0
  Filled 2021-07-19: qty 30, 30d supply, fill #1
  Filled 2021-09-06: qty 30, 30d supply, fill #2
  Filled 2021-10-31: qty 30, 30d supply, fill #3

## 2021-05-21 MED ORDER — SPIRONOLACTONE 25 MG PO TABS
25.0000 mg | ORAL_TABLET | Freq: Every day | ORAL | 0 refills | Status: DC
  Filled 2021-05-21 – 2021-05-31 (×2): qty 30, 30d supply, fill #0
  Filled 2021-07-19: qty 30, 30d supply, fill #1
  Filled 2021-09-06: qty 30, 30d supply, fill #2

## 2021-05-21 MED ORDER — ROSUVASTATIN CALCIUM 40 MG PO TABS
40.0000 mg | ORAL_TABLET | Freq: Every day | ORAL | 1 refills | Status: DC
  Filled 2021-05-21 – 2021-05-31 (×2): qty 30, 30d supply, fill #0
  Filled 2021-07-19: qty 30, 30d supply, fill #1
  Filled 2021-09-06: qty 30, 30d supply, fill #2
  Filled 2021-10-31: qty 30, 30d supply, fill #3
  Filled 2022-01-11: qty 30, 30d supply, fill #0

## 2021-05-21 MED ORDER — DAPAGLIFLOZIN PROPANEDIOL 10 MG PO TABS
10.0000 mg | ORAL_TABLET | Freq: Every day | ORAL | 0 refills | Status: DC
  Filled 2021-05-21 – 2021-05-31 (×2): qty 30, 30d supply, fill #0

## 2021-05-21 NOTE — Progress Notes (Signed)
Virtual Visit via Telephone Note Due to national recommendations of social distancing due to [place] 19, telehealth visit is felt to be most appropriate for this patient at this time.  I discussed the limitations, risks, security and privacy concerns of performing an evaluation and management service by telephone and the availability of in person appointments. I also discussed with the patient that there may be a patient responsible charge related to this service. The patient expressed understanding and agreed to proceed.    I connected with Parke Poisson on 05/21/21  at   9:30 AM EDT  EDT by telephone and verified that I am speaking with the correct person using two identifiers.  Location of Patient: Private Residence   Location of Provider: [place] [facility] [employer] Office    Persons participating in Telemedicine visit: Geryl Rankins FNP-BC [place]    History of Present Illness: Telemedicine visit for: Establish Care  has a past medical history of Acute systolic CHF  EF 32-54% (98/26/4158), CKD stage 3, GFR 30-59 ml/min ([place]), Hyperlipidemia, Hypertension, Obese, and Pulmonary embolism ([place]).   Recently dc'd from the hospital in May with new onset CHF then readmitted and discharged last month with acute PE of the RLL and CAP (Treated with IV abx). He was also noted for prediabetes with A1c 6.1. He is currently taking eliquis. Will likely require this for the next 3 - 6 months.   Today he denies any cough, hemoptysis, worsening shortness of breath or increased BLE edema. He does not monitor his blood pressure at home. Will need to come into the office for blood pressure check due to soft readings noted in hospital.  Currently prescribed spironolactone 25 mg daily Entresto 49-51 mg twice daily, Lasix 20 mg daily as needed bilateral lower extremity edema or shortness of breath, carvedilol 6.25 mg twice daily.  He currently denies chest pain, shortness of breath, palpitations,  lightheadedness, dizziness, headaches or BLE edema.   BP Readings from Last 3 Encounters:  04/14/21 97/83  04/03/21 98/66  03/29/21 114/81     Prediabetes He was started on farxiga 10 mg daily for prediabetes. States he was unaware of this diagnosis.   Past Medical History:  Diagnosis Date   Acute systolic CHF (congestive heart failure) ([place]) 03/26/2021   CKD (chronic kidney disease) stage 3, GFR 30-59 ml/min (HCC)    Hyperlipidemia    Hypertension    Obese    Pulmonary embolism (HCC)     Past Surgical History:  Procedure Laterality Date   APPENDECTOMY     RIGHT/LEFT HEART CATH AND CORONARY ANGIOGRAPHY N/A 03/28/2021   Procedure: RIGHT/LEFT HEART CATH AND CORONARY ANGIOGRAPHY;  Surgeon: Wellington Hampshire, MD;  Location: [facility];  Service: Cardiovascular;  Laterality: N/A;    Family History  Problem Relation Age of Onset   Diabetes Mother    Hypertension Mother    Stroke Mother    Hypertension Brother    Diabetes Brother    Heart disease Paternal Grandmother     Social History   Socioeconomic History   Marital status: Single    Spouse name: Not on file   Number of children: 4   Years of education: 12   Highest education level: High school graduate  Occupational History   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    Comment: part time  Tobacco Use   Smoking status: Never   Smokeless tobacco: Never  Vaping Use   Vaping Use: Never used  Substance and Sexual Activity   Alcohol use: No   Drug use: No   Sexual activity: Yes    Partners: Female    Birth control/protection: None  Other Topics Concern   Not on file  Social History Narrative   Marital Status:  Separated Clinical research associate)    Children:  Daughters (3) Step Son (1)    Pets: None    Living Situation: Lives alone   Occupation:  Proofreader (Marketing executive); Part-Time Doctor, hospital)   Education:  Merchant navy officer    Tobacco Use/Exposure:  None    Alcohol Use:  None    Drug Use:  None    Diet:  Regular   Exercise:  He does not do formal exercise but his jobs are very physical.     Hobbies:  Basketball                 Social Determinants of Health   Financial Resource Strain: High Risk   Difficulty of Paying Living Expenses: Hard  Food Insecurity: No Food Insecurity   Worried About Charity fundraiser in the Last Year: Never true   Arboriculturist in the Last Year: Never true  Transportation Needs: No Transportation Needs   Lack of Transportation (Medical): No   Lack of Transportation (Non-Medical): No  Physical Activity: Not on file  Stress: Not on file  Social Connections: Not on file     Observations/Objective: Awake, alert and oriented x 3   Review of Systems  Constitutional:  Negative for fever, malaise/fatigue and weight loss.  HENT: Negative.  Negative for nosebleeds.   Eyes: Negative.  Negative for blurred vision, double vision and photophobia.  Respiratory: Negative.  Negative for cough and shortness of breath.   Cardiovascular: Negative.  Negative for chest pain, palpitations and leg swelling.  Gastrointestinal:  Positive for heartburn. Negative for nausea and vomiting.  Musculoskeletal: Negative.  Negative for myalgias.  Neurological: Negative.  Negative for dizziness, focal weakness, seizures and headaches.  Psychiatric/Behavioral: Negative.  Negative for suicidal ideas.    Assessment and Plan: Diagnoses and all orders for this visit:  Encounter to establish care Blood pressure check tomorrow   Acute pulmonary embolism without acute cor pulmonale, unspecified pulmonary embolism type ([place]) Continue Eliquis as prescribed follow-up with cardiology next month as instructed  Hyponatremia -     CMP14+EGFR; Future  Colon cancer screening -     Fecal occult blood, imunochemical([facility]); Future  Prostate cancer screening -     PSA; Future  Acute systolic heart failure (HCC) -     furosemide (LASIX) 20 MG tablet; Take 1 tablet  (20 mg total) by mouth daily as needed for fluid or edema. -     spironolactone (ALDACTONE) 25 MG tablet; Take 1 tablet (25 mg total) by mouth daily.  GERD without esophagitis -     pantoprazole (PROTONIX) 40 MG tablet; Take 1 tablet (40 mg total) by mouth daily.  Dyslipidemia, goal LDL below 100 -     rosuvastatin (CRESTOR) 40 MG tablet; Take 1 tablet (40 mg total) by mouth daily.  Prediabetes  -     dapagliflozin propanediol (FARXIGA) 10 MG TABS tablet; Take 1 tablet (10 mg total) by mouth daily.  Follow Up Instructions Return in about 1 day (around 05/22/2021) for BP CHECK .     I discussed the assessment and treatment plan with the patient. The patient was provided an opportunity to ask questions and all were answered. The patient agreed  with the plan and demonstrated an understanding of the instructions.   The patient was advised to call back or seek an in-person evaluation if the symptoms worsen or if the condition fails to improve as anticipated.  I provided 18 minutes of non-face-to-face time during this encounter including median intraservice time, reviewing previous notes, labs, imaging, medications and explaining diagnosis and management.  Gildardo Pounds, FNP-BC

## 2021-05-22 ENCOUNTER — Encounter: Payer: Self-pay | Admitting: Nurse Practitioner

## 2021-05-22 ENCOUNTER — Other Ambulatory Visit: Payer: Self-pay

## 2021-05-22 NOTE — Addendum Note (Signed)
Addended byMemory Dance on: 05/22/2021 08:40 AM   Modules accepted: Orders

## 2021-05-22 NOTE — Progress Notes (Signed)
Patient seen in office today for blood pressure check.  Blood pressure was checked by the RMA with reading as follows: 122/80 Denies chest pain, shortness of breath, palpitations, lightheadedness, dizziness, headaches or BLE edema.

## 2021-05-23 LAB — CMP14+EGFR
ALT: 9 IU/L (ref 0–44)
AST: 16 IU/L (ref 0–40)
Albumin/Globulin Ratio: 1.4 (ref 1.2–2.2)
Albumin: 4.2 g/dL (ref 3.8–4.9)
Alkaline Phosphatase: 77 IU/L (ref 44–121)
BUN/Creatinine Ratio: 7 — ABNORMAL LOW (ref 9–20)
BUN: 8 mg/dL (ref 6–24)
Bilirubin Total: 1.3 mg/dL — ABNORMAL HIGH (ref 0.0–1.2)
CO2: 20 mmol/L (ref 20–29)
Calcium: 9.7 mg/dL (ref 8.7–10.2)
Chloride: 101 mmol/L (ref 96–106)
Creatinine, Ser: 1.17 mg/dL (ref 0.76–1.27)
Globulin, Total: 3 g/dL (ref 1.5–4.5)
Glucose: 97 mg/dL (ref 65–99)
Potassium: 4 mmol/L (ref 3.5–5.2)
Sodium: 137 mmol/L (ref 134–144)
Total Protein: 7.2 g/dL (ref 6.0–8.5)
eGFR: 74 mL/min/{1.73_m2} (ref >59)

## 2021-05-23 LAB — PSA: Prostate Specific Ag, Serum: 1.2 ng/mL (ref 0.0–4.0)

## 2021-05-29 ENCOUNTER — Other Ambulatory Visit: Payer: Self-pay

## 2021-05-31 ENCOUNTER — Other Ambulatory Visit: Payer: Self-pay

## 2021-06-01 ENCOUNTER — Other Ambulatory Visit: Payer: Self-pay

## 2021-06-24 NOTE — Progress Notes (Deleted)
Cardiology Office Note:   Date:  06/24/2021  NAME:  Andres Roman    MRN: [id number] DOB:  1966-05-05   PCP:  Claiborne Rigg, NP  Cardiologist:  None  Electrophysiologist:  None   Referring MD: Gillian Scarce, MD   No chief complaint on file. ***  History of Present Illness:   Andres Roman is a 55 y.o. male with a hx of CHF, HTN, non-obstructive CAD, PE who presents for follow-up. Seen in May for new onset systolic CHF. LHC with non-obstructive CAD. Returned to hospital in early June with diagnosis of acute PE. Suspect provoked.   Problem List Systolic HF -EF 25-30% 03/2021 -non-ischemic -2/2 HTN 2. HTN 3. Non-obstructive CAD 4. PE -04/08/2021  (after hospitalization)  Past Medical History: Past Medical History:  Diagnosis Date   Acute systolic CHF (congestive heart failure) (HCC) 03/26/2021   CKD (chronic kidney disease) stage 3, GFR 30-59 ml/min (HCC)    Hyperlipidemia    Hypertension    Obese    Pulmonary embolism (HCC)     Past Surgical History: Past Surgical History:  Procedure Laterality Date   APPENDECTOMY     RIGHT/LEFT HEART CATH AND CORONARY ANGIOGRAPHY N/A 03/28/2021   Procedure: RIGHT/LEFT HEART CATH AND CORONARY ANGIOGRAPHY;  Surgeon: Iran Ouch, MD;  [facility];  Service: Cardiovascular;  Laterality: N/A;    Current Medications: No outpatient medications have been marked as taking for the 06/25/21 encounter (Appointment) with O'Neal, Ronnald Ramp, MD.     Allergies:    Patient has no known allergies.   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  Tobacco Use   Smoking status: Never   Smokeless tobacco: Never  Vaping Use   Vaping Use: Never used   Substance and Sexual Activity   Alcohol use: No   Drug use: No   Sexual activity: Yes    Partners: Female    Birth control/protection: None  Other Topics Concern   Not on file  Social History Narrative   Marital Status:  Separated Marketing executive)    Children:  Daughters (3) Step Son (1)    Pets: None    Living Situation: Lives alone   Occupation:  Naval architect (Occupational psychologist); Part-Time Health and safety inspector)   Education:  Environmental consultant    Tobacco Use/Exposure:  None    Alcohol Use:  None    Drug Use:  None   Diet:  Regular   Exercise:  He does not do formal exercise but his jobs are very physical.     Hobbies:  Basketball                 Social Determinants of Health   Financial Resource Strain: High Risk   Difficulty of Paying Living Expenses: Hard  Food Insecurity: No Food Insecurity   Worried About Programme researcher, broadcasting/film/video in the Last Year: Never true   Barista in the Last Year: Never true  Transportation Needs: No Transportation Needs   Lack of Transportation (Medical): No   Lack of Transportation (Non-Medical): No  Physical Activity: Not on file  Stress: Not on file  Social Connections: Not on file  Family History: The patient's ***family history includes Diabetes in his brother and mother; Heart disease in his paternal grandmother; Hypertension in his brother and mother; Stroke in his mother.  ROS:   All other ROS reviewed and negative. Pertinent positives noted in the HPI.     EKGs/Labs/Other Studies Reviewed:   The following studies were personally reviewed by me today:  EKG:  EKG is *** ordered today.  The ekg ordered today demonstrates ***, and was personally reviewed by me.   TTE 04/09/2021   1. MR appears to be mild on present study compared to previous though not  well interrogated.   2. Left ventricular ejection fraction, by estimation, is 25 to 30%. The  left ventricle has severely decreased function. The left ventricle  demonstrates global  hypokinesis. The left ventricular internal cavity size  was moderately dilated. Left  ventricular diastolic parameters are consistent with Grade II diastolic  dysfunction (pseudonormalization).   3. Right ventricular systolic function is normal. The right ventricular  size is normal.   4. The mitral valve is normal in structure. Mild mitral valve  regurgitation. No evidence of mitral stenosis.   5. The aortic valve is tricuspid. Aortic valve regurgitation is mild. No  aortic stenosis is present.   6. The inferior vena cava is normal in size with greater than 50%  respiratory variability, suggesting right atrial pressure of 3 mmHg.   LHC 03/28/2021 Prox RCA lesion is 20% stenosed. RPAV lesion is 20% stenosed. Dist RCA lesion is 20% stenosed. Ost Cx to Mid Cx lesion is 20% stenosed. Ramus lesion is 30% stenosed. 1st Mrg lesion is 50% stenosed.   1.  Mild nonobstructive coronary artery disease. 2.  Right heart catheterization showed low filling pressures, normal pulmonary pressure and normal cardiac output.  No evidence of significant mitral regurgitation based on waveforms.  Recent Labs: 04/08/2021: B Natriuretic Peptide 172.7 04/09/2021: Magnesium 1.9 04/14/2021: Hemoglobin 13.3; Platelets 317 05/22/2021: ALT 9; BUN 8; Creatinine, Ser 1.17; Potassium 4.0; Sodium 137   Recent Lipid Panel    Component Value Date/Time   CHOL 191 03/26/2021 0919   TRIG 93 03/26/2021 0919   HDL 32 (L) 03/26/2021 0919   CHOLHDL 6.0 03/26/2021 0919   VLDL 19 03/26/2021 0919   LDLCALC 140 (H) 03/26/2021 0919    Physical Exam:   VS:  There were no vitals taken for this visit.   Wt Readings from Last 3 Encounters:  04/14/21 223 lb 15.8 oz (101.6 kg)  04/03/21 228 lb 4 oz (103.5 kg)  03/29/21 224 lb 11.2 oz (101.9 kg)    General: Well nourished, well developed, in no acute distress Head: Atraumatic, normal size  Eyes: PEERLA, EOMI  Neck: Supple, no JVD Endocrine: No thryomegaly Cardiac: Normal S1,  S2; RRR; no murmurs, rubs, or gallops Lungs: Clear to auscultation bilaterally, no wheezing, rhonchi or rales  Abd: Soft, nontender, no hepatomegaly  Ext: No edema, pulses 2+ Musculoskeletal: No deformities, BUE and BLE strength normal and equal Skin: Warm and dry, no rashes   Neuro: Alert and oriented to person, place, time, and situation, CNII-XII grossly intact, no focal deficits  Psych: Normal mood and affect   ASSESSMENT:   Mckoy Bhakta is a 55 y.o. male who presents for the following: No diagnosis found.  PLAN:   There are no diagnoses linked to this encounter.  {Are you ordering a CV Procedure (e.g. stress test, cath, DCCV, TEE, etc)?   Press F2        :  [id number]}  Disposition: No follow-ups on file.  Medication Adjustments/Labs and Tests Ordered: Current medicines are reviewed at length with the patient today.  Concerns regarding medicines are outlined above.  No orders of the defined types were placed in this encounter.  No orders of the defined types were placed in this encounter.   There are no Patient Instructions on file for this visit.   Time Spent with Patient: I have spent a total of *** minutes with patient reviewing hospital notes, telemetry, EKGs, labs and examining the patient as well as establishing an assessment and plan that was discussed with the patient.  > 50% of time was spent in direct patient care.  Signed, Lenna Gilford. Flora Lipps, MD, [facility]  [facility]  [address], [address] [place], [place] [address] [phone]  06/24/2021 9:58 AM

## 2021-06-25 ENCOUNTER — Ambulatory Visit (HOSPITAL_BASED_OUTPATIENT_CLINIC_OR_DEPARTMENT_OTHER): Payer: Self-pay | Admitting: Cardiovascular Disease

## 2021-06-25 DIAGNOSIS — I25810 Atherosclerosis of coronary artery bypass graft(s) without angina pectoris: Secondary | ICD-10-CM

## 2021-06-25 DIAGNOSIS — I5022 Chronic systolic (congestive) heart failure: Secondary | ICD-10-CM

## 2021-06-25 DIAGNOSIS — I2693 Single subsegmental pulmonary embolism without acute cor pulmonale: Secondary | ICD-10-CM

## 2021-06-25 DIAGNOSIS — E782 Mixed hyperlipidemia: Secondary | ICD-10-CM

## 2021-06-25 DIAGNOSIS — I428 Other cardiomyopathies: Secondary | ICD-10-CM

## 2021-06-25 DIAGNOSIS — I10 Essential (primary) hypertension: Secondary | ICD-10-CM

## 2021-07-19 ENCOUNTER — Other Ambulatory Visit: Payer: Self-pay

## 2021-08-29 NOTE — Progress Notes (Deleted)
Cardiology Office Note:   Date:  08/29/2021  NAME:  Andres Roman    MRN: [id number] DOB:  August 24, 1966   PCP:  Claiborne Rigg, NP  Cardiologist:  None  Electrophysiologist:  None   Referring MD: Gillian Scarce, MD   No chief complaint on file. ***  History of Present Illness:   Andres Roman is a 55 y.o. male with a hx of systolic HF, HLD, non-obstructive CAD, PE, CKD who presents for follow-up.   Problem List Systolic HF -EF 25-30% 03/29/2021 -Mild CAD 03/28/2021 2. HTN 3. Mild non-obstructive CAD  -LHC 03/28/2021 4. CKD 3a 5. PE 04/2021 6. HLD -T chol 191, HDL 32, LDL 140, TG 93  Past Medical History: Past Medical History:  Diagnosis Date   Acute systolic CHF (congestive heart failure) (HCC) 03/26/2021   CKD (chronic kidney disease) stage 3, GFR 30-59 ml/min (HCC)    Hyperlipidemia    Hypertension    Obese    Pulmonary embolism (HCC)     Past Surgical History: Past Surgical History:  Procedure Laterality Date   APPENDECTOMY     RIGHT/LEFT HEART CATH AND CORONARY ANGIOGRAPHY N/A 03/28/2021   Procedure: RIGHT/LEFT HEART CATH AND CORONARY ANGIOGRAPHY;  Surgeon: Iran Ouch, MD;  [facility]: [facility];  Service: Cardiovascular;  Laterality: N/A;    Current Medications: No outpatient medications have been marked as taking for the 08/30/21 encounter (Appointment) with O'Neal, Ronnald Ramp, MD.     Allergies:    Patient has no known allergies.   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  Tobacco Use   Smoking status: Never   Smokeless tobacco: Never  Vaping Use   Vaping Use: Never used  Substance and Sexual Activity   Alcohol use: No   Drug use: No   Sexual activity: Yes     Partners: Female    Birth control/protection: None  Other Topics Concern   Not on file  Social History Narrative   Marital Status:  Separated Marketing executive)    Children:  Daughters (3) Step Son (1)    Pets: None    Living Situation: Lives alone   Occupation:  Naval architect (Occupational psychologist); Part-Time Health and safety inspector)   Education:  Environmental consultant    Tobacco Use/Exposure:  None    Alcohol Use:  None    Drug Use:  None   Diet:  Regular   Exercise:  He does not do formal exercise but his jobs are very physical.     Hobbies:  Basketball                 Social Determinants of Health   Financial Resource Strain: High Risk   Difficulty of Paying Living Expenses: Hard  Food Insecurity: No Food Insecurity   Worried About Programme researcher, broadcasting/film/video in the Last Year: Never true   Barista in the Last Year: Never true  Transportation Needs: No Transportation Needs   Lack of Transportation (Medical): No   Lack of Transportation (Non-Medical): No  Physical Activity: Not on file  Stress: Not on file  Social Connections: Not on file     Family History: The patient's ***  family history includes Diabetes in his brother and mother; Heart disease in his paternal grandmother; Hypertension in his brother and mother; Stroke in his mother.  ROS:   All other ROS reviewed and negative. Pertinent positives noted in the HPI.     EKGs/Labs/Other Studies Reviewed:   The following studies were personally reviewed by me today:  EKG:  EKG is *** ordered today.  The ekg ordered today demonstrates ***, and was personally reviewed by me.   TTE 04/09/2021  1. MR appears to be mild on present study compared to previous though not  well interrogated.   2. Left ventricular ejection fraction, by estimation, is 25 to 30%. The  left ventricle has severely decreased function. The left ventricle  demonstrates global hypokinesis. The left ventricular internal cavity size  was moderately dilated. Left   ventricular diastolic parameters are consistent with Grade II diastolic  dysfunction (pseudonormalization).   3. Right ventricular systolic function is normal. The right ventricular  size is normal.   4. The mitral valve is normal in structure. Mild mitral valve  regurgitation. No evidence of mitral stenosis.   5. The aortic valve is tricuspid. Aortic valve regurgitation is mild. No  aortic stenosis is present.   6. The inferior vena cava is normal in size with greater than 50%  respiratory variability, suggesting right atrial pressure of 3 mmHg.   Recent Labs: 04/08/2021: B Natriuretic Peptide 172.7 04/09/2021: Magnesium 1.9 04/14/2021: Hemoglobin 13.3; Platelets 317 05/22/2021: ALT 9; BUN 8; Creatinine, Ser 1.17; Potassium 4.0; Sodium 137   Recent Lipid Panel    Component Value Date/Time   CHOL 191 03/26/2021 0919   TRIG 93 03/26/2021 0919   HDL 32 (L) 03/26/2021 0919   CHOLHDL 6.0 03/26/2021 0919   VLDL 19 03/26/2021 0919   LDLCALC 140 (H) 03/26/2021 0919    Physical Exam:   VS:  There were no vitals taken for this visit.   Wt Readings from Last 3 Encounters:  04/14/21 223 lb 15.8 oz (101.6 kg)  04/03/21 228 lb 4 oz (103.5 kg)  03/29/21 224 lb 11.2 oz (101.9 kg)    General: Well nourished, well developed, in no acute distress Head: Atraumatic, normal size  Eyes: PEERLA, EOMI  Neck: Supple, no JVD Endocrine: No thryomegaly Cardiac: Normal S1, S2; RRR; no murmurs, rubs, or gallops Lungs: Clear to auscultation bilaterally, no wheezing, rhonchi or rales  Abd: Soft, nontender, no hepatomegaly  Ext: No edema, pulses 2+ Musculoskeletal: No deformities, BUE and BLE strength normal and equal Skin: Warm and dry, no rashes   Neuro: Alert and oriented to person, place, time, and situation, CNII-XII grossly intact, no focal deficits  Psych: Normal mood and affect   ASSESSMENT:   Andres Roman is a 55 y.o. male who presents for the following: No diagnosis found.  PLAN:   There  are no diagnoses linked to this encounter.  {Are you ordering a CV Procedure (e.g. stress test, cath, DCCV, TEE, etc)?   Press F2        :[id number]}  Disposition: No follow-ups on file.  Medication Adjustments/Labs and Tests Ordered: Current medicines are reviewed at length with the patient today.  Concerns regarding medicines are outlined above.  No orders of the defined types were placed in this encounter.  No orders of the defined types were placed in this encounter.   There are no Patient Instructions on file for this visit.   Time Spent with Patient: I have spent a total of *** minutes  with patient reviewing hospital notes, telemetry, EKGs, labs and examining the patient as well as establishing an assessment and plan that was discussed with the patient.  > 50% of time was spent in direct patient care.  Signed, Lenna Gilford. Flora Lipps, MD, [facility]  [facility]  [address] [place], [place] [address] [phone]  08/29/2021 8:24 PM

## 2021-08-30 ENCOUNTER — Ambulatory Visit (HOSPITAL_BASED_OUTPATIENT_CLINIC_OR_DEPARTMENT_OTHER): Payer: Self-pay | Admitting: Cardiovascular Disease

## 2021-08-30 DIAGNOSIS — I5022 Chronic systolic (congestive) heart failure: Secondary | ICD-10-CM

## 2021-08-30 DIAGNOSIS — I10 Essential (primary) hypertension: Secondary | ICD-10-CM

## 2021-08-30 DIAGNOSIS — I428 Other cardiomyopathies: Secondary | ICD-10-CM

## 2021-08-30 DIAGNOSIS — I2693 Single subsegmental pulmonary embolism without acute cor pulmonale: Secondary | ICD-10-CM

## 2021-08-30 DIAGNOSIS — E782 Mixed hyperlipidemia: Secondary | ICD-10-CM

## 2021-08-30 DIAGNOSIS — I2510 Atherosclerotic heart disease of native coronary artery without angina pectoris: Secondary | ICD-10-CM

## 2021-09-06 ENCOUNTER — Other Ambulatory Visit: Payer: Self-pay

## 2021-09-07 ENCOUNTER — Other Ambulatory Visit: Payer: Self-pay

## 2021-10-31 ENCOUNTER — Other Ambulatory Visit: Payer: Self-pay | Admitting: Nurse Practitioner

## 2021-10-31 ENCOUNTER — Other Ambulatory Visit: Payer: Self-pay

## 2021-10-31 DIAGNOSIS — I5021 Acute systolic (congestive) heart failure: Secondary | ICD-10-CM

## 2021-10-31 MED ORDER — SPIRONOLACTONE 25 MG PO TABS
25.0000 mg | ORAL_TABLET | Freq: Every day | ORAL | 0 refills | Status: DC
  Filled 2021-10-31 – 2022-01-11 (×2): qty 30, 30d supply, fill #0

## 2021-10-31 NOTE — Telephone Encounter (Signed)
Requested Prescriptions  Pending Prescriptions Disp Refills   spironolactone (ALDACTONE) 25 MG tablet 90 tablet 0    Sig: Take 1 tablet (25 mg total) by mouth daily.     Cardiovascular: Diuretics - Aldosterone Antagonist Passed - 10/31/2021 10:14 AM      Passed - Cr in normal range and within 360 days    Creat  Date Value Ref Range Status  12/30/2013 0.89 0.50 - 1.35 mg/dL Final   Creatinine, Ser  Date Value Ref Range Status  05/22/2021 1.17 0.76 - 1.27 mg/dL Final         Passed - K in normal range and within 360 days    Potassium  Date Value Ref Range Status  05/22/2021 4.0 3.5 - 5.2 mmol/L Final         Passed - Na in normal range and within 360 days    Sodium  Date Value Ref Range Status  05/22/2021 137 134 - 144 mmol/L Final         Passed - Last BP in normal range    BP Readings from Last 1 Encounters:  04/14/21 97/83         Passed - Valid encounter within last 6 months    Recent Outpatient Visits          5 months ago Encounter to establish care   [facility] Claiborne Rigg, NP      Future Appointments            In 1 month O'Neal, Ronnald Ramp, MD [facility]

## 2021-11-02 ENCOUNTER — Other Ambulatory Visit: Payer: Self-pay

## 2021-12-04 ENCOUNTER — Ambulatory Visit (HOSPITAL_BASED_OUTPATIENT_CLINIC_OR_DEPARTMENT_OTHER): Payer: Self-pay | Admitting: Cardiovascular Disease

## 2021-12-31 ENCOUNTER — Telehealth: Payer: Self-pay | Admitting: Cardiovascular Disease

## 2021-12-31 NOTE — Telephone Encounter (Signed)
Patient states he hasn't received his Entresto through his patient assistance.  He doesn't have the number to call the company.

## 2021-12-31 NOTE — Telephone Encounter (Signed)
Attempted to contact patient to discuss further. Unable to reach patient to give him the number  [phone].  Will try again later.

## 2022-01-01 NOTE — Telephone Encounter (Signed)
Left the number for entresto on the patients voicemail.

## 2022-01-11 ENCOUNTER — Other Ambulatory Visit: Payer: Self-pay

## 2022-01-11 ENCOUNTER — Other Ambulatory Visit: Payer: Self-pay | Admitting: Nurse Practitioner

## 2022-01-11 DIAGNOSIS — I5021 Acute systolic (congestive) heart failure: Secondary | ICD-10-CM

## 2022-01-11 NOTE — Telephone Encounter (Signed)
Requested medication (s) are due for refill today: yes ? ?Requested medication (s) are on the active medication list: yes ? ?Last refill:  05/21/21 #30/3 ? ?Future visit scheduled: no ? ?Notes to clinic:  pt is overdue for an appt and labs. LVMTCB ? ? ?  ?Requested Prescriptions  ?Pending Prescriptions Disp Refills  ? furosemide (LASIX) 20 MG tablet 30 tablet 3  ?  Sig: Take 1 tablet (20 mg total) by mouth daily as needed for fluid or edema.  ?  ? Cardiovascular:  Diuretics - Loop Failed - 01/11/2022 12:41 PM  ?  ?  Failed - K in normal range and within 180 days  ?  Potassium  ?Date Value Ref Range Status  ?05/22/2021 4.0 3.5 - 5.2 mmol/L Final  ?  ?  ?  ?  Failed - Ca in normal range and within 180 days  ?  Calcium  ?Date Value Ref Range Status  ?05/22/2021 9.7 8.7 - 10.2 mg/dL Final  ? ?Calcium, Ion  ?Date Value Ref Range Status  ?03/28/2021 1.18 1.15 - 1.40 mmol/L Final  ?  ?  ?  ?  Failed - Na in normal range and within 180 days  ?  Sodium  ?Date Value Ref Range Status  ?05/22/2021 137 134 - 144 mmol/L Final  ?  ?  ?  ?  Failed - Cr in normal range and within 180 days  ?  Creat  ?Date Value Ref Range Status  ?12/30/2013 0.89 0.50 - 1.35 mg/dL Final  ? ?Creatinine, Ser  ?Date Value Ref Range Status  ?05/22/2021 1.17 0.76 - 1.27 mg/dL Final  ?  ?  ?  ?  Failed - Cl in normal range and within 180 days  ?  Chloride  ?Date Value Ref Range Status  ?05/22/2021 101 96 - 106 mmol/L Final  ?  ?  ?  ?  Failed - Mg Level in normal range and within 180 days  ?  Magnesium  ?Date Value Ref Range Status  ?04/09/2021 1.9 1.7 - 2.4 mg/dL Final  ?  Comment:  ?  Performed at [facility], [address] [place], [place] [address]  ?  ?  ?  ?  Failed - Valid encounter within last 6 months  ?  Recent Outpatient Visits   ? ?      ? 7 months ago Encounter to establish care  ? [facility] Claiborne Rigg, NP  ? ?  ?  ?Future Appointments   ? ?        ? In 2 weeks O'Neal, Ronnald Ramp, MD [facility]  [facility]  ? ?  ? ?  ?  ?  Passed - Last BP in normal range  ?  BP Readings from Last 1 Encounters:  ?04/14/21 97/83  ?  ?  ?  ?  ? ?

## 2022-01-11 NOTE — Telephone Encounter (Signed)
Patient called, left VM to return the call to the office to scheduled an appt for medication refill request.   

## 2022-01-15 ENCOUNTER — Other Ambulatory Visit: Payer: Self-pay

## 2022-01-17 ENCOUNTER — Other Ambulatory Visit: Payer: Self-pay

## 2022-01-28 NOTE — Progress Notes (Deleted)
?Cardiology Office Note:   ?Date:  01/28/2022  ?NAME:  Andres Roman    ?MRN: [id number] ?DOB:  1966-07-16  ? ?PCP:  Claiborne Rigg, NP  ?Cardiologist:  None  ?Electrophysiologist:  None  ? ?Referring MD: Gillian Scarce, MD  ? ?No chief complaint on file. ?*** ? ?History of Present Illness:   ?Andres Roman is a 56 y.o. male with a hx of uncontrolled HTN, CHF, HLD who presents for follow-up.  ? ?Problem List ?Systolic HF ?-EF 74-14% ?-2/2 uncontrolled HTN ?2. Non-obstructive CAD ?3. HTN ?4. HLD ? ?Past Medical History: ?Past Medical History:  ?Diagnosis Date  ? Acute systolic CHF (congestive heart failure) (HCC) 03/26/2021  ? CKD (chronic kidney disease) stage 3, GFR 30-59 ml/min (HCC)   ? Hyperlipidemia   ? Hypertension   ? Obese   ? Pulmonary embolism (HCC)   ? ? ?Past Surgical History: ?Past Surgical History:  ?Procedure Laterality Date  ? APPENDECTOMY    ? RIGHT/LEFT HEART CATH AND CORONARY ANGIOGRAPHY N/A 03/28/2021  ? Procedure: RIGHT/LEFT HEART CATH AND CORONARY ANGIOGRAPHY;  Surgeon: Iran Ouch, MD;  Location: [facility];  Service: Cardiovascular;  Laterality: N/A;  ? ? ?Current Medications: ?No outpatient medications have been marked as taking for the 01/31/22 encounter (Appointment) with O'Neal, Ronnald Ramp, MD.  ?  ? ?Allergies:    ?Patient has no known allergies.  ? ?Social History: ?Social History  ? ?Socioeconomic History  ? Marital status: Single  ?  Spouse name: Not on file  ? Number of children: 4  ? Years of education: 27  ? Highest education level: High school graduate  ?Occupational History  ? Occupation: wearhouse cleaning  ?  Employer: [employer]  ?  Comment: part time  ?Tobacco Use  ? Smoking status: Never  ? Smokeless tobacco: Never  ?Vaping Use  ? Vaping Use: Never used  ?Substance and Sexual Activity  ? Alcohol use: No  ? Drug use: No  ? Sexual activity: Yes  ?  Partners: Female  ?  Birth control/protection: None  ?Other Topics Concern  ? Not on file  ?Social History  Narrative  ? Marital Status:  Separated (Angie)   ? Children:  Daughters (3) Step Son (1)   ? Pets: None   ? Living Situation: Lives alone  ? Occupation:  Warehouse Engineer, materials); Part-Time Health and safety inspector)  ? Education:  [employer]   ? Tobacco Use/Exposure:  None   ? Alcohol Use:  None   ? Drug Use:  None  ? Diet:  Regular  ? Exercise:  He does not do formal exercise but his jobs are very physical.    ? Hobbies:  Basketball    ?   ?   ?   ?   ? ?Social Determinants of Health  ? ?Financial Resource Strain: High Risk  ? Difficulty of Paying Living Expenses: Hard  ?Food Insecurity: No Food Insecurity  ? Worried About Programme researcher, broadcasting/film/video in the Last Year: Never true  ? Ran Out of Food in the Last Year: Never true  ?Transportation Needs: No Transportation Needs  ? Lack of Transportation (Medical): No  ? Lack of Transportation (Non-Medical): No  ?Physical Activity: Not on file  ?Stress: Not on file  ?Social Connections: Not on file  ?  ? ?Family History: ?The patient's ***family history includes Diabetes in his brother and mother; Heart disease in his paternal grandmother; Hypertension in his brother and mother; Stroke in his  mother. ? ?ROS:   ?All other ROS reviewed and negative. Pertinent positives noted in the HPI.    ? ?EKGs/Labs/Other Studies Reviewed:   ?The following studies were personally reviewed by me today: ? ?EKG:  EKG is *** ordered today.  The ekg ordered today demonstrates ***, and was personally reviewed by me.  ? ?LHC 03/28/2021 ?Prox RCA lesion is 20% stenosed. ?RPAV lesion is 20% stenosed. ?Dist RCA lesion is 20% stenosed. ?Ost Cx to Mid Cx lesion is 20% stenosed. ?Ramus lesion is 30% stenosed. ?1st Mrg lesion is 50% stenosed. ?  ?1.  Mild nonobstructive coronary artery disease. ?2.  Right heart catheterization showed low filling pressures, normal pulmonary pressure and normal cardiac output.  No evidence of significant mitral regurgitation based on waveforms. ? ?TTE 04/09/2021 ? 1. MR  appears to be mild on present study compared to previous though not  ?well interrogated.  ? 2. Left ventricular ejection fraction, by estimation, is 25 to 30%. The  ?left ventricle has severely decreased function. The left ventricle  ?demonstrates global hypokinesis. The left ventricular internal cavity size  ?was moderately dilated. Left  ?ventricular diastolic parameters are consistent with Grade II diastolic  ?dysfunction (pseudonormalization).  ? 3. Right ventricular systolic function is normal. The right ventricular  ?size is normal.  ? 4. The mitral valve is normal in structure. Mild mitral valve  ?regurgitation. No evidence of mitral stenosis.  ? 5. The aortic valve is tricuspid. Aortic valve regurgitation is mild. No  ?aortic stenosis is present.  ? 6. The inferior vena cava is normal in size with greater than 50%  ?respiratory variability, suggesting right atrial pressure of 3 mmHg.  ? ?Recent Labs: ?04/08/2021: B Natriuretic Peptide 172.7 ?04/09/2021: Magnesium 1.9 ?04/14/2021: Hemoglobin 13.3; Platelets 317 ?05/22/2021: ALT 9; BUN 8; Creatinine, Ser 1.17; Potassium 4.0; Sodium 137  ? ?Recent Lipid Panel ?   ?Component Value Date/Time  ? CHOL 191 03/26/2021 0919  ? TRIG 93 03/26/2021 0919  ? HDL 32 (L) 03/26/2021 0919  ? CHOLHDL 6.0 03/26/2021 0919  ? VLDL 19 03/26/2021 0919  ? LDLCALC 140 (H) 03/26/2021 0919  ? ? ?Physical Exam:   ?VS:  There were no vitals taken for this visit.   ?Wt Readings from Last 3 Encounters:  ?04/14/21 223 lb 15.8 oz (101.6 kg)  ?04/03/21 228 lb 4 oz (103.5 kg)  ?03/29/21 224 lb 11.2 oz (101.9 kg)  ?  ?General: Well nourished, well developed, in no acute distress ?Head: Atraumatic, normal size  ?Eyes: PEERLA, EOMI  ?Neck: Supple, no JVD ?Endocrine: No thryomegaly ?Cardiac: Normal S1, S2; RRR; no murmurs, rubs, or gallops ?Lungs: Clear to auscultation bilaterally, no wheezing, rhonchi or rales  ?Abd: Soft, nontender, no hepatomegaly  ?Ext: No edema, pulses 2+ ?Musculoskeletal: No  deformities, BUE and BLE strength normal and equal ?Skin: Warm and dry, no rashes   ?Neuro: Alert and oriented to person, place, time, and situation, CNII-XII grossly intact, no focal deficits  ?Psych: Normal mood and affect  ? ?ASSESSMENT:   ?Andres Roman is a 56 y.o. male who presents for the following: ?No diagnosis found. ? ?PLAN:   ?There are no diagnoses linked to this encounter. ? ?{Are you ordering a CV Procedure (e.g. stress test, cath, DCCV, TEE, etc)?   Press F2        :[id number]} ? ?Disposition: No follow-ups on file. ? ?Medication Adjustments/Labs and Tests Ordered: ?Current medicines are reviewed at length with the patient today.  Concerns regarding medicines are outlined  above.  ?No orders of the defined types were placed in this encounter. ? ?No orders of the defined types were placed in this encounter. ? ? ?There are no Patient Instructions on file for this visit.  ? ?Time Spent with Patient: I have spent a total of *** minutes with patient reviewing hospital notes, telemetry, EKGs, labs and examining the patient as well as establishing an assessment and plan that was discussed with the patient.  > 50% of time was spent in direct patient care. ? ?Signed, ?[place] T. Audie Box, MD, [facility] ?[address]  ?[place], [address] ?[place], [place] [address] ?([phone]  ?01/28/2022 7:06 PM    ? ?

## 2022-01-29 ENCOUNTER — Other Ambulatory Visit: Payer: Self-pay

## 2022-01-31 ENCOUNTER — Ambulatory Visit: Payer: Self-pay | Admitting: Cardiovascular Disease

## 2022-01-31 DIAGNOSIS — I428 Other cardiomyopathies: Secondary | ICD-10-CM

## 2022-01-31 DIAGNOSIS — I5022 Chronic systolic (congestive) heart failure: Secondary | ICD-10-CM

## 2022-01-31 DIAGNOSIS — E782 Mixed hyperlipidemia: Secondary | ICD-10-CM

## 2022-01-31 DIAGNOSIS — I10 Essential (primary) hypertension: Secondary | ICD-10-CM

## 2022-01-31 DIAGNOSIS — I2510 Atherosclerotic heart disease of native coronary artery without angina pectoris: Secondary | ICD-10-CM

## 2022-04-08 NOTE — Progress Notes (Unsigned)
Cardiology Office Note:   Date:  04/11/2022  NAME:  Andres Roman    MRN: [id number] DOB:  1966/10/28   PCP:  Gildardo Pounds, NP  Cardiologist:  None  Electrophysiologist:  None   Referring MD: Jonathon Resides, MD   Chief Complaint  Patient presents with   Follow-up   History of Present Illness:   [name] is a 56 y.o. male with a hx of systolic HF, HTN, HLD, PE who presents for follow-up.  He presents for follow-up.  Has not been seen in over a year.  Diagnosed with congestive heart failure May 2022.  Left heart catheterization with nonobstructive CAD.  Congestive heart failure attributed to hypertension.  He reports he is still taking some of his medications but needs refills.  I do not believe he is taking all of them.  Blood pressure 160/90.  He is euvolemic on exam.  No chest pains or trouble breathing.  He reports occasional swelling in his legs.  He needs a full panel of labs.  Nothing has been checked in over a year.  Diagnosed with a pulmonary embolism in June 2022 after his heart failure hospitalization.  Has completed Eliquis.  Also needs a repeat echocardiogram.  EKG with sinus rhythm and nonspecific ST-T changes.  Overall doing well.  Just needs to get back on his medications.  Problem List Systolic CHF -EF [id number] 2. Non-obstructive CAD 3. HTN 4. HLD 5. PE -04/2021  Past Medical History: Past Medical History:  Diagnosis Date   Acute systolic CHF (congestive heart failure) ([place]) 03/26/2021   CKD (chronic kidney disease) stage 3, GFR 30-59 ml/min (HCC)    Hyperlipidemia    Hypertension    Obese    Pulmonary embolism (HCC)     Past Surgical History: Past Surgical History:  Procedure Laterality Date   APPENDECTOMY     RIGHT/LEFT HEART CATH AND CORONARY ANGIOGRAPHY N/A 03/28/2021   Procedure: RIGHT/LEFT HEART CATH AND CORONARY ANGIOGRAPHY;  Surgeon: Wellington Hampshire, MD;  Location: [facility];  Service: Cardiovascular;  Laterality: N/A;     Current Medications: Current Meds  Medication Sig   acetaminophen (TYLENOL) 325 MG tablet Take 325-650 mg by mouth every 6 (six) hours as needed (for headaches).   APIXABAN (ELIQUIS) VTE STARTER PACK (10MG  AND 5MG ) Take as directed on package: start with two-5mg  tablets twice daily for 7 days. On day 8, switch to one-5mg  tablet twice daily.   carvedilol (COREG) 6.25 MG tablet Take 1 tablet (6.25 mg total) by mouth 2 (two) times daily with a meal.   dapagliflozin propanediol (FARXIGA) 10 MG TABS tablet Take 1 tablet (10 mg total) by mouth daily.   sacubitril-valsartan (ENTRESTO) 49-51 MG Take 1 tablet by mouth 2 (two) times daily.     Allergies:    Patient has no known allergies.   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  Tobacco Use   Smoking status: Never   Smokeless tobacco: Never  Vaping Use   Vaping Use: Never used  Substance and Sexual Activity   Alcohol use: No   Drug use: No   Sexual activity: Yes    Partners: Female  Birth control/protection: None  Other Topics Concern   Not on file  Social History Narrative   Marital Status:  Separated Clinical research associate)    Children:  Daughters (3) Step Son (1)    Pets: None    Living Situation: Lives alone   Occupation:  Proofreader (Marketing executive); Part-Time Doctor, hospital)   Education:  Merchant navy officer    Tobacco Use/Exposure:  None    Alcohol Use:  None    Drug Use:  None   Diet:  Regular   Exercise:  He does not do formal exercise but his jobs are very physical.     Hobbies:  Basketball                 Social Determinants of Radio broadcast assistant Strain: Not on file  Food Insecurity: Not on file  Transportation Needs: Not on file  Physical  Activity: Not on file  Stress: Not on file  Social Connections: Not on file     Family History: The patient's family history includes Diabetes in his brother and mother; Heart disease in his paternal grandmother; Hypertension in his brother and mother; Stroke in his mother.  ROS:   All other ROS reviewed and negative. Pertinent positives noted in the HPI.     EKGs/Labs/Other Studies Reviewed:   The following studies were personally reviewed by me today:  EKG:  EKG is ordered today.  The ekg ordered today demonstrates normal sinus rhythm heart rate 73, nonspecific ST-T changes, and was personally reviewed by me.   Recent Labs: 04/14/2021: Hemoglobin 13.3; Platelets 317 05/22/2021: ALT 9; BUN 8; Creatinine, Ser 1.17; Potassium 4.0; Sodium 137   Recent Lipid Panel    Component Value Date/Time   CHOL 191 03/26/2021 0919   TRIG 93 03/26/2021 0919   HDL 32 (L) 03/26/2021 0919   CHOLHDL 6.0 03/26/2021 0919   VLDL 19 03/26/2021 0919   LDLCALC 140 (H) 03/26/2021 0919    Physical Exam:   VS:  BP (!) 160/90   Pulse 73   Ht 5\' 7"  (1.702 m)   Wt 252 lb (114.3 kg)   SpO2 97%   BMI 39.47 kg/m    Wt Readings from Last 3 Encounters:  04/11/22 252 lb (114.3 kg)  04/14/21 223 lb 15.8 oz (101.6 kg)  04/03/21 228 lb 4 oz (103.5 kg)    General: Well nourished, well developed, in no acute distress Head: Atraumatic, normal size  Eyes: PEERLA, EOMI  Neck: Supple, no JVD Endocrine: No thryomegaly Cardiac: Normal S1, S2; RRR; no murmurs, rubs, or gallops Lungs: Clear to auscultation bilaterally, no wheezing, rhonchi or rales  Abd: Soft, nontender, no hepatomegaly  Ext: No edema, pulses 2+ Musculoskeletal: No deformities, BUE and BLE strength normal and equal Skin: Warm and dry, no rashes   Neuro: Alert and oriented to person, place, time, and situation, CNII-XII grossly intact, no focal deficits  Psych: Normal mood and affect   ASSESSMENT:   Andres Roman is a 56 y.o. male who presents  for the following: 1. Chronic systolic CHF (congestive heart failure) ([place])   2. NICM (nonischemic cardiomyopathy) ([place])   3. Essential hypertension, benign   4. Mixed hyperlipidemia   5. Acute systolic heart failure ([place])   6. Dyslipidemia, goal LDL below 100   7. Encounter to establish care     PLAN:   1. Chronic systolic CHF (congestive heart failure) ([place]) 2. NICM (nonischemic cardiomyopathy) ([place]) -Diagnosed with congestive heart failure May 2022.  This  was attributed to hypertension.  Left heart catheterization with nonobstructive CAD.  Blood pressure elevated today.  Needs to get back on medications.  Increase Coreg to 12.5 mg twice daily.  Refill Entresto 49-51 mg twice daily, Aldactone 25 mg daily, Jardiance 10 mg daily, Lasix 20 mg daily as needed.  No signs of volume overload.  Euvolemic on exam.  I would like to recheck an echocardiogram as this has not been checked in over a year.  He will need to see me back in 6 weeks to discuss further.  He can pursue what ever activity he likes.  I have encouraged him to remain active as well as to lose weight.  3. Essential hypertension, benign -Refill heart failure medications as above.  4. Mixed hyperlipidemia -Nonobstructive CAD on left heart cath last year.  Refill Crestor 40 mg daily.  He needs a full panel of labs including CBC, CMP, lipids, A1c, TSH.  We need to make sure his kidney function is stable due to heart failure regimen.  Also need to see what his cholesterol levels look like.  Disposition: Return in about 6 weeks (around 05/23/2022).  Medication Adjustments/Labs and Tests Ordered: Current medicines are reviewed at length with the patient today.  Concerns regarding medicines are outlined above.  No orders of the defined types were placed in this encounter.  No orders of the defined types were placed in this encounter.   Patient Instructions  Medication Instructions:  Increase Coreg to 12.5 mg twice daily  START  Jardiance 10 mg daily   *If you need a refill on your cardiac medications before your next appointment, please call your pharmacy*   Lab Work: LIPID, CBC, CMET, A1C, TSH today   If you have labs (blood work) drawn today and your tests are completely normal, you will receive your results only by: [facility] (if you have MyChart) OR A paper copy in the mail If you have any lab test that is abnormal or we need to change your treatment, we will call you to review the results.   Testing/Procedures: Echocardiogram - Your physician has requested that you have an echocardiogram. Echocardiography is a painless test that uses sound waves to create images of your heart. It provides your doctor with information about the size and shape of your heart and how well your heart's chambers and valves are working. This procedure takes approximately one hour. There are no restrictions for this procedure.     Follow-Up: At [facility], you and your health needs are our priority.  As part of our continuing mission to provide you with exceptional heart care, we have created designated Provider Care Teams.  These Care Teams include your primary Cardiologist (physician) and Advanced Practice Providers (APPs -  Physician Assistants and Nurse Practitioners) who all work together to provide you with the care you need, when you need it.  We recommend signing up for the patient portal called "MyChart".  Sign up information is provided on this After Visit Summary.  MyChart is used to connect with patients for Virtual Visits (Telemedicine).  Patients are able to view lab/test results, encounter notes, upcoming appointments, etc.  Non-urgent messages can be sent to your provider as well.   To learn more about what you can do with MyChart, go to [email or web address].    Your next appointment:   6 week(s)  The format for your next appointment:   In Person  Provider:   Eleonore Chiquito, MD  Time Spent with Patient: I have spent a total of 35 minutes with patient reviewing hospital notes, telemetry, EKGs, labs and examining the patient as well as establishing an assessment and plan that was discussed with the patient.  > 50% of time was spent in direct patient care.  Signed, Addison Naegeli. Audie Box, MD, [facility]  [address], [place], [place] [address] [phone]  04/11/2022 9:30 AM

## 2022-04-10 ENCOUNTER — Ambulatory Visit: Payer: Self-pay | Admitting: Nurse Practitioner

## 2022-04-11 ENCOUNTER — Encounter: Payer: Self-pay | Admitting: Cardiovascular Disease

## 2022-04-11 ENCOUNTER — Other Ambulatory Visit: Payer: Self-pay

## 2022-04-11 ENCOUNTER — Ambulatory Visit (INDEPENDENT_AMBULATORY_CARE_PROVIDER_SITE_OTHER): Payer: Self-pay | Admitting: Cardiovascular Disease

## 2022-04-11 VITALS — BP 160/90 | HR 73 | Ht 67.0 in | Wt 252.0 lb

## 2022-04-11 DIAGNOSIS — I10 Essential (primary) hypertension: Secondary | ICD-10-CM

## 2022-04-11 DIAGNOSIS — I5022 Chronic systolic (congestive) heart failure: Secondary | ICD-10-CM

## 2022-04-11 DIAGNOSIS — I428 Other cardiomyopathies: Secondary | ICD-10-CM

## 2022-04-11 DIAGNOSIS — E785 Hyperlipidemia, unspecified: Secondary | ICD-10-CM

## 2022-04-11 DIAGNOSIS — E782 Mixed hyperlipidemia: Secondary | ICD-10-CM

## 2022-04-11 DIAGNOSIS — I5021 Acute systolic (congestive) heart failure: Secondary | ICD-10-CM

## 2022-04-11 DIAGNOSIS — Z7689 Persons encountering health services in other specified circumstances: Secondary | ICD-10-CM

## 2022-04-11 LAB — COMPREHENSIVE METABOLIC PANEL
ALT: 44 IU/L (ref 0–44)
AST: 37 IU/L (ref 0–40)
Albumin/Globulin Ratio: 1.4 (ref 1.2–2.2)
Albumin: 4.2 g/dL (ref 3.8–4.9)
Alkaline Phosphatase: 88 IU/L (ref 44–121)
BUN/Creatinine Ratio: 8 — ABNORMAL LOW (ref 9–20)
BUN: 9 mg/dL (ref 6–24)
Bilirubin Total: 1.1 mg/dL (ref 0.0–1.2)
CO2: 22 mmol/L (ref 20–29)
Calcium: 9.3 mg/dL (ref 8.7–10.2)
Chloride: 101 mmol/L (ref 96–106)
Creatinine, Ser: 1.14 mg/dL (ref 0.76–1.27)
Globulin, Total: 2.9 g/dL (ref 1.5–4.5)
Glucose: 89 mg/dL (ref 70–99)
Potassium: 4.2 mmol/L (ref 3.5–5.2)
Sodium: 136 mmol/L (ref 134–144)
Total Protein: 7.1 g/dL (ref 6.0–8.5)
eGFR: 75 mL/min/{1.73_m2} (ref >59)

## 2022-04-11 LAB — LIPID PANEL
Chol/HDL Ratio: 6.9 ratio — ABNORMAL HIGH (ref 0.0–5.0)
Cholesterol, Total: 240 mg/dL — ABNORMAL HIGH (ref 100–199)
HDL: 35 mg/dL — ABNORMAL LOW (ref >39)
LDL Chol Calc (NIH): 180 mg/dL — ABNORMAL HIGH (ref 0–99)
Triglycerides: 137 mg/dL (ref 0–149)
VLDL Cholesterol Cal: 25 mg/dL (ref 5–40)

## 2022-04-11 LAB — TSH: TSH: 1.55 u[IU]/mL (ref 0.450–4.500)

## 2022-04-11 LAB — CBC
Hematocrit: 44.5 % (ref 37.5–51.0)
Hemoglobin: 15.3 g/dL (ref 13.0–17.7)
MCH: 27.4 pg (ref 26.6–33.0)
MCHC: 34.4 g/dL (ref 31.5–35.7)
MCV: 80 fL (ref 79–97)
Platelets: 262 10*3/uL (ref 150–450)
RBC: 5.59 x10E6/uL (ref 4.14–5.80)
RDW: 14.4 % (ref 11.6–15.4)
WBC: 6.6 10*3/uL (ref 3.4–10.8)

## 2022-04-11 LAB — HEMOGLOBIN A1C
Est. average glucose Bld gHb Est-mCnc: 120 mg/dL
Hgb A1c MFr Bld: 5.8 % — ABNORMAL HIGH (ref 4.8–5.6)

## 2022-04-11 MED ORDER — CARVEDILOL 12.5 MG PO TABS
12.5000 mg | ORAL_TABLET | Freq: Two times a day (BID) | ORAL | 1 refills | Status: DC
  Filled 2022-04-11: qty 180, 90d supply, fill #0

## 2022-04-11 MED ORDER — FUROSEMIDE 20 MG PO TABS
20.0000 mg | ORAL_TABLET | Freq: Every day | ORAL | 1 refills | Status: DC | PRN
  Filled 2022-04-11: qty 90, 90d supply, fill #0

## 2022-04-11 MED ORDER — SACUBITRIL-VALSARTAN 49-51 MG PO TABS
1.0000 | ORAL_TABLET | Freq: Two times a day (BID) | ORAL | 2 refills | Status: DC
  Filled 2022-04-11: qty 60, 30d supply, fill #0

## 2022-04-11 MED ORDER — ROSUVASTATIN CALCIUM 40 MG PO TABS
40.0000 mg | ORAL_TABLET | Freq: Every day | ORAL | 1 refills | Status: DC
  Filled 2022-04-11: qty 90, 90d supply, fill #0

## 2022-04-11 MED ORDER — DAPAGLIFLOZIN PROPANEDIOL 10 MG PO TABS
10.0000 mg | ORAL_TABLET | Freq: Every day | ORAL | 1 refills | Status: DC
  Filled 2022-04-11: qty 90, 90d supply, fill #0

## 2022-04-11 MED ORDER — SPIRONOLACTONE 25 MG PO TABS
25.0000 mg | ORAL_TABLET | Freq: Every day | ORAL | 1 refills | Status: DC
  Filled 2022-04-11: qty 90, 90d supply, fill #0

## 2022-04-11 NOTE — Patient Instructions (Addendum)
Medication Instructions:  Increase Coreg to 12.5 mg twice daily  START Jardiance 10 mg daily   *If you need a refill on your cardiac medications before your next appointment, please call your pharmacy*   Lab Work: LIPID, CBC, CMET, A1C, TSH today   If you have labs (blood work) drawn today and your tests are completely normal, you will receive your results only by: [place] (if you have MyChart) OR A paper copy in the mail If you have any lab test that is abnormal or we need to change your treatment, we will call you to review the results.   Testing/Procedures: Echocardiogram - Your physician has requested that you have an echocardiogram. Echocardiography is a painless test that uses sound waves to create images of your heart. It provides your doctor with information about the size and shape of your heart and how well your heart's chambers and valves are working. This procedure takes approximately one hour. There are no restrictions for this procedure.     Follow-Up: At [facility], you and your health needs are our priority.  As part of our continuing mission to provide you with exceptional heart care, we have created designated Provider Care Teams.  These Care Teams include your primary Cardiologist (physician) and Advanced Practice Providers (APPs -  Physician Assistants and Nurse Practitioners) who all work together to provide you with the care you need, when you need it.  We recommend signing up for the patient portal called "MyChart".  Sign up information is provided on this After Visit Summary.  MyChart is used to connect with patients for Virtual Visits (Telemedicine).  Patients are able to view lab/test results, encounter notes, upcoming appointments, etc.  Non-urgent messages can be sent to your provider as well.   To learn more about what you can do with MyChart, go to [email or web address].    Your next appointment:   6 week(s)  The format for your next  appointment:   In Person  Provider:   Eleonore Chiquito, MD

## 2022-04-23 ENCOUNTER — Telehealth: Payer: Self-pay | Admitting: Cardiovascular Disease

## 2022-04-23 NOTE — Telephone Encounter (Signed)
Patient wanted lab results. Per Dr. Glean Hess cholesterol is a bit elevated.  Would recommend you restart your cholesterol medication as we discussed yesterday.  Your A1c shows that you are not diabetic.  Your kidney function is stable." Patient will restart rosuvastatin 40 mg daily. No other concerns at this time.

## 2022-04-23 NOTE — Telephone Encounter (Signed)
Patient called to get the results of his echo

## 2022-04-24 ENCOUNTER — Ambulatory Visit: Payer: Self-pay | Admitting: Nurse Practitioner

## 2022-04-29 ENCOUNTER — Ambulatory Visit (HOSPITAL_COMMUNITY): Payer: No Typology Code available for payment source | Attending: Internal Medicine

## 2022-04-29 DIAGNOSIS — I5022 Chronic systolic (congestive) heart failure: Secondary | ICD-10-CM | POA: Insufficient documentation

## 2022-04-29 LAB — ECHOCARDIOGRAM COMPLETE
Area-P 1/2: 2.95 cm2
P 1/2 time: 859 msec
S' Lateral: 3.4 cm

## 2022-04-30 ENCOUNTER — Telehealth: Payer: Self-pay | Admitting: Cardiovascular Disease

## 2022-04-30 NOTE — Telephone Encounter (Signed)
Patient returned call for his echo results 

## 2022-04-30 NOTE — Telephone Encounter (Signed)
Called patient back, gave results. Patient verbalized understanding.  

## 2022-05-21 NOTE — Progress Notes (Unsigned)
Cardiology Office Note:   Date:  05/23/2022  NAME:  Andres Roman    MRN: [id number] DOB:  06/10/1966   PCP:  Gildardo Pounds, NP  Cardiologist:  None  Electrophysiologist:  None   Referring MD: Gildardo Pounds, NP   Chief Complaint  Patient presents with   Follow-up        History of Present Illness:   Andres Roman is a 56 y.o. male with a hx of CHF, HTN, PE who presents for follow-up. Started back on home medications. Follow-up today.  He reports he is doing well.  Blood pressure still not at goal.  We are increasing his carvedilol and Entresto today.  He denies any chest pain or trouble breathing.  No volume overload.  He is watching his diet.  Start a walk on a treadmill 30 minutes every other day.  I have congratulated him on this.  He needs to cut back on soda and carbohydrates to help lose his weight.  He will work on this.  Overall doing well.  Cholesterol level not at goal but we are working on this.  Problem List Systolic CHF -EF [id number] -EF 50-55% 04/2022 2. Non-obstructive CAD 3. HTN 4. HLD -T chol 240, HDL 35, LDL 180, TG 137 5. PE -04/2021  Past Medical History: Past Medical History:  Diagnosis Date   Acute systolic CHF (congestive heart failure) ([place]) 03/26/2021   CKD (chronic kidney disease) stage 3, GFR 30-59 ml/min (HCC)    Hyperlipidemia    Hypertension    Obese    Pulmonary embolism (HCC)     Past Surgical History: Past Surgical History:  Procedure Laterality Date   APPENDECTOMY     RIGHT/LEFT HEART CATH AND CORONARY ANGIOGRAPHY N/A 03/28/2021   Procedure: RIGHT/LEFT HEART CATH AND CORONARY ANGIOGRAPHY;  Surgeon: Wellington Hampshire, MD;  Location: [facility];  Service: Cardiovascular;  Laterality: N/A;    Current Medications: Current Meds  Medication Sig   acetaminophen (TYLENOL) 325 MG tablet Take 325-650 mg by mouth every 6 (six) hours as needed (for headaches).   dapagliflozin propanediol (FARXIGA) 10 MG TABS tablet Take 1  tablet (10 mg total) by mouth daily.   furosemide (LASIX) 20 MG tablet Take 1 tablet (20 mg total) by mouth daily as needed for fluid or edema.   pantoprazole (PROTONIX) 40 MG tablet Take 1 tablet (40 mg total) by mouth daily.   rosuvastatin (CRESTOR) 40 MG tablet Take 1 tablet (40 mg total) by mouth daily.   sacubitril-valsartan (ENTRESTO) 97-103 MG Take 1 tablet by mouth 2 (two) times daily.   spironolactone (ALDACTONE) 25 MG tablet Take 1 tablet (25 mg total) by mouth daily.   [DISCONTINUED] carvedilol (COREG) 12.5 MG tablet Take 1 tablet (12.5 mg total) by mouth 2 (two) times daily with a meal.   [DISCONTINUED] sacubitril-valsartan (ENTRESTO) 49-51 MG Take 1 tablet by mouth 2 (two) times daily.     Allergies:    Patient has no known allergies.   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  Tobacco Use   Smoking status: Never   Smokeless tobacco: Never  Vaping Use   Vaping Use: Never used  Substance and Sexual Activity   Alcohol use: No   Drug use: No   Sexual activity: Yes    Partners: Female    Birth control/protection: None  Other Topics Concern   Not on file  Social History Narrative   Marital Status:  Separated Marketing executive)    Children:  Daughters (3) Step Son (1)    Pets: None    Living Situation: Lives alone   Occupation:  Naval architect (Occupational psychologist); Part-Time Health and safety inspector)   Education:  Environmental consultant    Tobacco Use/Exposure:  None    Alcohol Use:  None    Drug Use:  None   Diet:  Regular   Exercise:  He does not do formal exercise but his jobs are very physical.     Hobbies:  Basketball                 Social Determinants of Health   Financial Resource Strain: High Risk (03/27/2021)    Overall Financial Resource Strain (CARDIA)    Difficulty of Paying Living Expenses: Hard  Food Insecurity: No Food Insecurity (03/27/2021)   Hunger Vital Sign    Worried About Running Out of Food in the Last Year: Never true    Ran Out of Food in the Last Year: Never true  Transportation Needs: No Transportation Needs (03/27/2021)   PRAPARE - Administrator, Civil Service (Medical): No    Lack of Transportation (Non-Medical): No  Physical Activity: Not on file  Stress: Not on file  Social Connections: Not on file     Family History: The patient's family history includes Diabetes in his brother and mother; Heart disease in his paternal grandmother; Hypertension in his brother and mother; Stroke in his mother.  ROS:   All other ROS reviewed and negative. Pertinent positives noted in the HPI.     EKGs/Labs/Other Studies Reviewed:   The following studies were personally reviewed by me today:  TTE 04/29/2022  1. The aortic valve is tricuspid. There is mild thickening of the aortic  valve. Aortic valve regurgitation is eccentric with prominent splay  artifact. Aortic valve regurgitation appears mild to moderate, however  with eccentric component may be moderate  and underestimated. See Recommendations. No aortic stenosis is present.   2. Left ventricular ejection fraction, by estimation, is 50 to 55%. The  left ventricle has low normal function. The left ventricle has no definite  regional wall motion abnormalities. There is mild left ventricular  hypertrophy. Left ventricular diastolic   parameters were low normal for age. The average left ventricular global  longitudinal strain is -21.9 %. The global longitudinal strain is normal.   3. Right ventricular systolic function is normal. The right ventricular  size is normal. Tricuspid regurgitation signal is inadequate for assessing  PA pressure.   4. The mitral valve is normal in structure. Mild mitral valve  regurgitation. No  evidence of mitral stenosis.   5. The inferior vena cava is normal in size with greater than 50%  respiratory variability, suggesting right atrial pressure of 3 mmHg.   Recent Labs: 04/11/2022: ALT 44; BUN 9; Creatinine, Ser 1.14; Hemoglobin 15.3; Platelets 262; Potassium 4.2; Sodium 136; TSH 1.550   Recent Lipid Panel    Component Value Date/Time   CHOL 240 (H) 04/11/2022 0940   TRIG 137 04/11/2022 0940   HDL 35 (L) 04/11/2022 0940   CHOLHDL 6.9 (H) 04/11/2022 0940   CHOLHDL 6.0 03/26/2021 0919   VLDL 19 03/26/2021 0919  LDLCALC 180 (H) 04/11/2022 0940    Physical Exam:   VS:  BP (!) 152/88   Pulse 75   Ht 5\' 7"  (1.702 m)   Wt 252 lb 9.6 oz (114.6 kg)   SpO2 96%   BMI 39.56 kg/m    Wt Readings from Last 3 Encounters:  05/23/22 252 lb 9.6 oz (114.6 kg)  04/11/22 252 lb (114.3 kg)  04/14/21 223 lb 15.8 oz (101.6 kg)    General: Well nourished, well developed, in no acute distress Head: Atraumatic, normal size  Eyes: PEERLA, EOMI  Neck: Supple, no JVD Endocrine: No thryomegaly Cardiac: Normal S1, S2; RRR; no murmurs, rubs, or gallops Lungs: Clear to auscultation bilaterally, no wheezing, rhonchi or rales  Abd: Soft, nontender, no hepatomegaly  Ext: No edema, pulses 2+ Musculoskeletal: No deformities, BUE and BLE strength normal and equal Skin: Warm and dry, no rashes   Neuro: Alert and oriented to person, place, time, and situation, CNII-XII grossly intact, no focal deficits  Psych: Normal mood and affect   ASSESSMENT:   Kamyron Georger is a 56 y.o. male who presents for the following: 1. Chronic systolic CHF (congestive heart failure) ([place])   2. NICM (nonischemic cardiomyopathy) ([place])   3. Essential hypertension, benign   4. Mixed hyperlipidemia     PLAN:   1. Chronic systolic CHF (congestive heart failure) ([place]) 2. NICM (nonischemic cardiomyopathy) ([place]) 3. Essential hypertension, benign -Nonischemic cardiomyopathy in the setting of elevated hypertension.   Ejection fraction has recovered.  BP still not at goal.  We will increase his carvedilol to 25 mg twice daily.  Increase Entresto to 97-103 mg twice daily.  Continue Aldactone 25 mg daily.  Kidney function is stable.  Continue Farxiga 10 mg daily.  On Lasix as needed.  Overall doing well.  EF is recovered.  We will go with aggressive blood pressure control.  He is exercising appropriately he will work on weight loss and salt reduction.  Overall doing well.  4. Mixed hyperlipidemia -Poorly controlled lipids.  Was not on medication.  Restarted his Crestor.  We will plan to recheck this in 3 to 4 months.  He also should continue aspirin therapy.  Denies any symptoms in office of angina.     Disposition: Return in about 3 months (around 08/23/2022).  Medication Adjustments/Labs and Tests Ordered: Current medicines are reviewed at length with the patient today.  Concerns regarding medicines are outlined above.  No orders of the defined types were placed in this encounter.  Meds ordered this encounter  Medications   carvedilol (COREG) 25 MG tablet    Sig: Take 1 tablet (25 mg total) by mouth 2 (two) times daily with a meal.    Dispense:  180 tablet    Refill:  1   sacubitril-valsartan (ENTRESTO) 97-103 MG    Sig: Take 1 tablet by mouth 2 (two) times daily.    Dispense:  60 tablet    Refill:  3    Patient Instructions  Medication Instructions:  Increase Coreg 25 twice daily  Increase Entresto 97-103 twice daily   *If you need a refill on your cardiac medications before your next appointment, please call your pharmacy*   Follow-Up: At [facility], you and your health needs are our priority.  As part of our continuing mission to provide you with exceptional heart care, we have created designated Provider Care Teams.  These Care Teams include your primary Cardiologist (physician) and Advanced Practice Providers (APPs -  Physician Assistants  and Nurse Practitioners) who all work together to  provide you with the care you need, when you need it.  We recommend signing up for the patient portal called "MyChart".  Sign up information is provided on this After Visit Summary.  MyChart is used to connect with patients for Virtual Visits (Telemedicine).  Patients are able to view lab/test results, encounter notes, upcoming appointments, etc.  Non-urgent messages can be sent to your provider as well.   To learn more about what you can do with MyChart, go to [email or web address].    Your next appointment:   3 month(s)  The format for your next appointment:   In Person  Provider:   Lennie Odor, MD           Time Spent with Patient: I have spent a total of 25 minutes with patient reviewing hospital notes, telemetry, EKGs, labs and examining the patient as well as establishing an assessment and plan that was discussed with the patient.  > 50% of time was spent in direct patient care.  Signed, Lenna Gilford. Flora Lipps, MD, [facility]  [facility]  [address] [place], [place] [address] [phone]  05/23/2022 8:51 AM

## 2022-05-23 ENCOUNTER — Encounter: Payer: Self-pay | Admitting: Cardiovascular Disease

## 2022-05-23 ENCOUNTER — Ambulatory Visit (INDEPENDENT_AMBULATORY_CARE_PROVIDER_SITE_OTHER): Payer: No Typology Code available for payment source | Admitting: Cardiovascular Disease

## 2022-05-23 VITALS — BP 152/88 | HR 75 | Ht 67.0 in | Wt 252.6 lb

## 2022-05-23 DIAGNOSIS — I5022 Chronic systolic (congestive) heart failure: Secondary | ICD-10-CM | POA: Diagnosis not present

## 2022-05-23 DIAGNOSIS — I10 Essential (primary) hypertension: Secondary | ICD-10-CM | POA: Diagnosis not present

## 2022-05-23 DIAGNOSIS — I428 Other cardiomyopathies: Secondary | ICD-10-CM

## 2022-05-23 DIAGNOSIS — E782 Mixed hyperlipidemia: Secondary | ICD-10-CM

## 2022-05-23 MED ORDER — SACUBITRIL-VALSARTAN 97-103 MG PO TABS: 1.0000 | ORAL_TABLET | Freq: Two times a day (BID) | ORAL | 3 refills | Status: DC

## 2022-05-23 MED ORDER — CARVEDILOL 25 MG PO TABS: 25.0000 mg | ORAL_TABLET | Freq: Two times a day (BID) | ORAL | 1 refills | Status: DC

## 2022-05-23 NOTE — Patient Instructions (Signed)
Medication Instructions:  Increase Coreg 25 twice daily  Increase Entresto 97-103 twice daily   *If you need a refill on your cardiac medications before your next appointment, please call your pharmacy*   Follow-Up: At [facility], you and your health needs are our priority.  As part of our continuing mission to provide you with exceptional heart care, we have created designated Provider Care Teams.  These Care Teams include your primary Cardiologist (physician) and Advanced Practice Providers (APPs -  Physician Assistants and Nurse Practitioners) who all work together to provide you with the care you need, when you need it.  We recommend signing up for the patient portal called "MyChart".  Sign up information is provided on this After Visit Summary.  MyChart is used to connect with patients for Virtual Visits (Telemedicine).  Patients are able to view lab/test results, encounter notes, upcoming appointments, etc.  Non-urgent messages can be sent to your provider as well.   To learn more about what you can do with MyChart, go to [email or web address].    Your next appointment:   3 month(s)  The format for your next appointment:   In Person  Provider:   Lennie Odor, MD

## 2022-07-26 ENCOUNTER — Ambulatory Visit: Payer: Self-pay | Admitting: Nurse Practitioner

## 2022-09-04 ENCOUNTER — Ambulatory Visit: Payer: No Typology Code available for payment source | Admitting: Cardiovascular Disease

## 2022-09-11 ENCOUNTER — Ambulatory Visit: Payer: Self-pay | Admitting: Nurse Practitioner

## 2022-10-11 ENCOUNTER — Telehealth: Payer: Self-pay | Admitting: Cardiovascular Disease

## 2022-10-11 MED ORDER — SACUBITRIL-VALSARTAN 97-103 MG PO TABS: 1.0000 | ORAL_TABLET | Freq: Two times a day (BID) | ORAL | 3 refills | Status: DC

## 2022-10-11 NOTE — Telephone Encounter (Signed)
*  STAT* If patient is at the pharmacy, call can be transferred to refill team.   1. Which medications need to be refilled? (please list name of each medication and dose if known)  sacubitril-valsartan (ENTRESTO) 97-103 MG  dapagliflozin propanediol (FARXIGA) 10 MG TABS tablet  2. Which pharmacy/location (including street and city if local pharmacy) is medication to be sent to?  [facility] [id number] - [place],  - [address]    3. Do they need a 30 day or 90 day supply? 90 day supply

## 2022-12-15 NOTE — Progress Notes (Deleted)
Cardiology Office Note:   Date:  12/15/2022  NAME:  Andres Roman    MRN: [id number] DOB:  Jul 21, 1966   PCP:  Gildardo Pounds, NP  Cardiologist:  None  Electrophysiologist:  None   Referring MD: Gildardo Pounds, NP   No chief complaint on file. ***  History of Present Illness:   Andres Roman is a 57 y.o. male with a hx of NICM, CAD, HTN, HLD who presents for follow-up.   Problem List Systolic CHF -EF 34-19% 04/2228 -EF 50-55% 04/2022 2. Non-obstructive CAD 3. HTN 4. HLD -T chol 240, HDL 35, LDL 180, TG 137 5. PE -04/2021  Past Medical History: Past Medical History:  Diagnosis Date   Acute systolic CHF (congestive heart failure) ([place]) 03/26/2021   CKD (chronic kidney disease) stage 3, GFR 30-59 ml/min (HCC)    Hyperlipidemia    Hypertension    Obese    Pulmonary embolism (HCC)     Past Surgical History: Past Surgical History:  Procedure Laterality Date   APPENDECTOMY     RIGHT/LEFT HEART CATH AND CORONARY ANGIOGRAPHY N/A 03/28/2021   Procedure: RIGHT/LEFT HEART CATH AND CORONARY ANGIOGRAPHY;  Surgeon: Wellington Hampshire, MD;  Location: [facility];  Service: Cardiovascular;  Laterality: N/A;    Current Medications: No outpatient medications have been marked as taking for the 12/18/22 encounter (Appointment) with O'Neal, Cassie Freer, MD.     Allergies:    Patient has no known allergies.   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  Tobacco Use   Smoking status: Never   Smokeless tobacco: Never  Vaping Use   Vaping Use: Never used  Substance and Sexual Activity   Alcohol use: No   Drug use: No   Sexual activity: Yes    Partners: Female    Birth control/protection: None  Other  Topics Concern   Not on file  Social History Narrative   Marital Status:  Separated Clinical research associate)    Children:  Daughters (3) Step Son (1)    Pets: None    Living Situation: Lives alone   Occupation:  Proofreader (Marketing executive); Part-Time Doctor, hospital)   Education:  Merchant navy officer    Tobacco Use/Exposure:  None    Alcohol Use:  None    Drug Use:  None   Diet:  Regular   Exercise:  He does not do formal exercise but his jobs are very physical.     Hobbies:  Basketball                 Social Determinants of Health   Financial Resource Strain: High Risk (03/27/2021)   Overall Financial Resource Strain (CARDIA)    Difficulty of Paying Living Expenses: Hard  Food Insecurity: No Food Insecurity (03/27/2021)   Hunger Vital Sign    Worried About Running Out of Food in the Last Year: Never true    [place] in the Last Year: Never true  Transportation Needs: No Transportation Needs (03/27/2021)   PRAPARE - Hydrologist (Medical): No    Lack of Transportation (Non-Medical): No  Physical Activity: Not on file  Stress: Not on file  Social Connections: Not on file     Family History: The patient's ***family history includes Diabetes in his brother and mother; Heart disease in his paternal grandmother; Hypertension in his brother and mother; Stroke in his mother.  ROS:   All other ROS reviewed and negative. Pertinent positives noted in the HPI.     EKGs/Labs/Other Studies Reviewed:   The following studies were personally reviewed by me today:  EKG:  EKG is *** ordered today.  The ekg ordered today demonstrates ***, and was personally reviewed by me.   Recent Labs: 04/11/2022: ALT 44; BUN 9; Creatinine, Ser 1.14; Hemoglobin 15.3; Platelets 262; Potassium 4.2; Sodium 136; TSH 1.550   Recent Lipid Panel    Component Value Date/Time   CHOL 240 (H) 04/11/2022 0940   TRIG 137 04/11/2022 0940   HDL 35 (L) 04/11/2022 0940   CHOLHDL 6.9 (H) 04/11/2022  0940   CHOLHDL 6.0 03/26/2021 0919   VLDL 19 03/26/2021 0919   LDLCALC 180 (H) 04/11/2022 0940    Physical Exam:   VS:  There were no vitals taken for this visit.   Wt Readings from Last 3 Encounters:  05/23/22 252 lb 9.6 oz (114.6 kg)  04/11/22 252 lb (114.3 kg)  04/14/21 223 lb 15.8 oz (101.6 kg)    General: Well nourished, well developed, in no acute distress Head: Atraumatic, normal size  Eyes: PEERLA, EOMI  Neck: Supple, no JVD Endocrine: No thryomegaly Cardiac: Normal S1, S2; RRR; no murmurs, rubs, or gallops Lungs: Clear to auscultation bilaterally, no wheezing, rhonchi or rales  Abd: Soft, nontender, no hepatomegaly  Ext: No edema, pulses 2+ Musculoskeletal: No deformities, BUE and BLE strength normal and equal Skin: Warm and dry, no rashes   Neuro: Alert and oriented to person, place, time, and situation, CNII-XII grossly intact, no focal deficits  Psych: Normal mood and affect   ASSESSMENT:   Andres Roman is a 57 y.o. male who presents for the following: No diagnosis found.  PLAN:   There are no diagnoses linked to this encounter.  {Are you ordering a CV Procedure (e.g. stress test, cath, DCCV, TEE, etc)?   Press F2        :[id number]}  Disposition: No follow-ups on file.  Medication Adjustments/Labs and Tests Ordered: Current medicines are reviewed at length with the patient today.  Concerns regarding medicines are outlined above.  No orders of the defined types were placed in this encounter.  No orders of the defined types were placed in this encounter.   There are no Patient Instructions on file for this visit.   Time Spent with Patient: I have spent a total of *** minutes with patient reviewing hospital notes, telemetry, EKGs, labs and examining the patient as well as establishing an assessment and plan that was discussed with the patient.  > 50% of time was spent in direct patient care.  Signed, Addison Naegeli. Audie Box, MD, [place] [facility]   [address] [place], [place] [address] [phone]  12/15/2022 3:14 PM

## 2022-12-18 ENCOUNTER — Ambulatory Visit: Payer: No Typology Code available for payment source | Admitting: Cardiovascular Disease

## 2022-12-18 DIAGNOSIS — E782 Mixed hyperlipidemia: Secondary | ICD-10-CM

## 2022-12-18 DIAGNOSIS — I5022 Chronic systolic (congestive) heart failure: Secondary | ICD-10-CM

## 2022-12-18 DIAGNOSIS — I10 Essential (primary) hypertension: Secondary | ICD-10-CM

## 2022-12-18 DIAGNOSIS — I428 Other cardiomyopathies: Secondary | ICD-10-CM

## 2023-01-02 ENCOUNTER — Other Ambulatory Visit: Payer: Self-pay

## 2023-01-02 ENCOUNTER — Telehealth: Payer: Self-pay | Admitting: Cardiovascular Disease

## 2023-01-02 DIAGNOSIS — E785 Hyperlipidemia, unspecified: Secondary | ICD-10-CM

## 2023-01-02 MED ORDER — SACUBITRIL-VALSARTAN 97-103 MG PO TABS: 1.0000 | ORAL_TABLET | Freq: Two times a day (BID) | ORAL | 3 refills | Status: DC

## 2023-01-02 MED ORDER — ROSUVASTATIN CALCIUM 40 MG PO TABS: 40.0000 mg | ORAL_TABLET | Freq: Every day | ORAL | 1 refills | Status: AC

## 2023-01-02 NOTE — Telephone Encounter (Signed)
*  STAT* If patient is at the pharmacy, call can be transferred to refill team.   1. Which medications need to be refilled? (please list name of each medication and dose if known) Entresto and Rosuvastatin  2. Which pharmacy/location (including street and city if local pharmacy) is medication to be sent to?[facility] [address] [place]  3. Do they need a 30 day or 90 day supply? 60 days and refills- please call in today if possible

## 2023-01-15 NOTE — Progress Notes (Signed)
Cardiology Clinic Note   Patient Name: Andres Roman Date of Encounter: 01/17/2023  Primary Care Provider:  Gildardo Pounds, NP Primary Cardiologist:  Dr. Audie Roman   Patient Profile    57 year old male with history of nonobstructive CAD, chronic systolic heart failure with most recent echocardiogram dated 04/29/2022 revealing normal LVEF of 50 to 55%, mild to moderate aortic valve regurgitation, hypertension, hyperlipidemia, and PE.  Last seen by Dr. Audie Roman on 05/23/2022.  At that time Crestor was restarted due to elevated lipids, carvedilol was increased to 25 mg twice daily, spironolactone 25 mg daily, Entresto was increased to 97/103 mg twice daily, along with Farxiga 10 mg daily.  He was to take Lasix as needed.  Blood pressure control was priority.  Past Medical History    Past Medical History:  Diagnosis Date   Acute systolic CHF (congestive heart failure) ([place]) 03/26/2021   CKD (chronic kidney disease) stage 3, GFR 30-59 ml/min (HCC)    Hyperlipidemia    Hypertension    Obese    Pulmonary embolism ([place])    Past Surgical History:  Procedure Laterality Date   APPENDECTOMY     RIGHT/LEFT HEART CATH AND CORONARY ANGIOGRAPHY N/A 03/28/2021   Procedure: RIGHT/LEFT HEART CATH AND CORONARY ANGIOGRAPHY;  Surgeon: Andres Hampshire, MD;  Location: [facility];  Service: Cardiovascular;  Laterality: N/A;    Allergies  No Known Allergies  History of Present Illness    Mr. Andres Roman presents today for ongoing assessment and management of NICM, chronic systolic heart failure with recent echocardiogram in 2023 revealing normalization of EF, on GDMT as described above to include Entresto, Farxiga, carvedilol, spironolactone.  He also has mixed hyperlipidemia and was started back on rosuvastatin on last office visit with Dr. Audie Roman.  He is in need of refills today on office visit.    He comes today without any complaints.  He states he is uncertain which medications he needs to be taking  as he has noticed that his blood pressure is not well-controlled.  On review of his medications he has not been taking carvedilol nor has he been on Farxiga as directed.  He has been taking rosuvastatin, Entresto, and spironolactone.  He denies any chest discomfort, dyspnea on exertion.  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  He denies any exertional associated fatigue dizziness or chest discomfort.  He denies any fluid retention, edema, or abdominal fullness. Home Medications    Current Outpatient Medications  Medication Sig Dispense Refill   dapagliflozin propanediol (FARXIGA) 10 MG TABS tablet Take 1 tablet (10 mg total) by mouth daily before breakfast. 21 tablet 0   carvedilol (COREG) 25 MG tablet Take 1 tablet (25 mg total) by mouth 2 (two) times daily with a meal. (Patient not taking: Reported on 01/17/2023) 180 tablet 1   dapagliflozin propanediol (FARXIGA) 10 MG TABS tablet Take 1 tablet (10 mg total) by mouth daily. (Patient not taking: Reported on 01/17/2023) 90 tablet 1   furosemide (LASIX) 20 MG tablet Take 1 tablet (20 mg total) by mouth daily as needed for fluid or edema. (Patient not taking: Reported on 01/17/2023) 90 tablet 1   pantoprazole (PROTONIX) 40 MG tablet Take 1 tablet (40 mg total) by mouth daily. (Patient not taking: Reported on 01/17/2023) 90 tablet 1   rosuvastatin (CRESTOR) 40 MG tablet Take 1 tablet (40 mg total) by mouth daily. (Patient not taking: Reported on 01/17/2023) 90 tablet 1   sacubitril-valsartan (  ENTRESTO) 97-103 MG Take 1 tablet by mouth 2 (two) times daily. (Patient not taking: Reported on 01/17/2023) 60 tablet 3   spironolactone (ALDACTONE) 25 MG tablet Take 1 tablet (25 mg total) by mouth daily. (Patient not taking: Reported on 01/17/2023) 90 tablet 1   No current facility-administered medications for this visit.     Family History    Family History  Problem Relation Age of Onset   Diabetes Mother    Hypertension Mother    Stroke  Mother    Hypertension Brother    Diabetes Brother    Heart disease Paternal Grandmother    He indicated that his mother is deceased. He indicated that his father is deceased. He indicated that both of his sisters are alive. He indicated that his brother is alive. He indicated that his maternal grandmother is deceased. He indicated that his maternal grandfather is deceased. He indicated that his paternal grandmother is deceased. He indicated that his paternal grandfather is deceased. He indicated that all of his three daughters are alive.  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    Comment: part time  Tobacco Use   Smoking status: Never   Smokeless tobacco: Never  Vaping Use   Vaping Use: Never used  Substance and Sexual Activity   Alcohol use: No   Drug use: No   Sexual activity: Yes    Partners: Female    Birth control/protection: None  Other Topics Concern   Not on file  Social History Narrative   Marital Status:  Separated Clinical research associate)    Children:  Daughters (3) Step Son (1)    Pets: None    Living Situation: Lives alone   Occupation:  Proofreader (Marketing executive); Part-Time Doctor, hospital)   Education:  Merchant navy officer    Tobacco Use/Exposure:  None    Alcohol Use:  None    Drug Use:  None   Diet:  Regular   Exercise:  He does not do formal exercise but his jobs are very physical.     Hobbies:  Basketball                 Social Determinants of Health   Financial Resource Strain: High Risk (03/27/2021)   Overall Financial Resource Strain (CARDIA)    Difficulty of Paying Living Expenses: Hard  Food Insecurity: No Food Insecurity (03/27/2021)   Hunger Vital Sign    Worried About Running Out of Food in the Last Year:  Never true    [place] in the Last Year: Never true  Transportation Needs: No Transportation Needs (03/27/2021)   PRAPARE - Hydrologist (Medical): No    Lack of Transportation (Non-Medical): No  Physical Activity: Not on file  Stress: Not on file  Social Connections: Not on file  Intimate Partner Violence: Not on file     Review of Systems    General:  No chills, fever, night sweats or weight changes.  Cardiovascular:  No chest pain, dyspnea on exertion, edema, orthopnea, palpitations, paroxysmal nocturnal dyspnea. Dermatological: No rash, lesions/masses Respiratory: No cough, dyspnea Urologic: No hematuria, dysuria Abdominal:   No nausea, vomiting, diarrhea, bright red blood per  rectum, melena, or hematemesis Neurologic:  No visual changes, wkns, changes in mental status. All other systems reviewed and are otherwise negative except as noted above.     Physical Exam    VS:  BP (!) 164/76 (BP Location: Left Arm, Patient Position: Sitting, Cuff Size: Large)   Pulse 71   Ht 5\' 7"  (1.702 m)   Wt 247 lb 12.8 oz (112.4 kg)   SpO2 95%   BMI 38.81 kg/m  , BMI Body mass index is 38.81 kg/m.     GEN: Well nourished, well developed, in no acute distress.  Obese HEENT: normal. Neck: Supple, no JVD, carotid bruits, or masses. Cardiac: RRR, no murmurs, rubs, or gallops. No clubbing, cyanosis, edema.  Radials/DP/PT 2+ and equal bilaterally.  Respiratory:  Respirations regular and unlabored, clear to auscultation bilaterally. GI: Soft, nontender, nondistended, BS + x 4. MS: no deformity or atrophy. Skin: warm and dry, no rash. Neuro:  Strength and sensation are intact. Psych: Normal affect.  Accessory Clinical Findings    ECG personally reviewed by me today-normal sinus rhythm, heart rate 71 bpm.  T wave inversion noted in the apical and lateral leads.- No acute changes  Lab Results  Component Value Date   WBC 6.6 04/11/2022   HGB 15.3  04/11/2022   HCT 44.5 04/11/2022   MCV 80 04/11/2022   PLT 262 04/11/2022   Lab Results  Component Value Date   CREATININE 1.14 04/11/2022   BUN 9 04/11/2022   NA 136 04/11/2022   K 4.2 04/11/2022   CL 101 04/11/2022   CO2 22 04/11/2022   Lab Results  Component Value Date   ALT 44 04/11/2022   AST 37 04/11/2022   ALKPHOS 88 04/11/2022   BILITOT 1.1 04/11/2022   Lab Results  Component Value Date   CHOL 240 (H) 04/11/2022   HDL 35 (L) 04/11/2022   LDLCALC 180 (H) 04/11/2022   TRIG 137 04/11/2022   CHOLHDL 6.9 (H) 04/11/2022    Lab Results  Component Value Date   HGBA1C 5.8 (H) 04/11/2022    Review of Prior Studies: Echocardiogram 04/29/2022 1. The aortic valve is tricuspid. There is mild thickening of the aortic  valve. Aortic valve regurgitation is eccentric with prominent splay  artifact. Aortic valve regurgitation appears mild to moderate, however  with eccentric component may be moderate  and underestimated. See Recommendations. No aortic stenosis is present.   2. Left ventricular ejection fraction, by estimation, is 50 to 55%. The  left ventricle has low normal function. The left ventricle has no definite  regional wall motion abnormalities. There is mild left ventricular  hypertrophy. Left ventricular diastolic   parameters were low normal for age. The average left ventricular global  longitudinal strain is -21.9 %. The global longitudinal strain is normal.   3. Right ventricular systolic function is normal. The right ventricular  size is normal. Tricuspid regurgitation signal is inadequate for assessing  PA pressure.   4. The mitral valve is normal in structure. Mild mitral valve  regurgitation. No evidence of mitral stenosis.   5. The inferior vena cava is normal in size with greater than 50%  respiratory variability, suggesting right atrial pressure of 3 mmHg.   Right and Left Heart Cath 03/28/2021 Prox RCA lesion is 20% stenosed. RPAV lesion is 20%  stenosed. Dist RCA lesion is 20% stenosed. Ost Cx to Mid Cx lesion is 20% stenosed. Ramus lesion is 30% stenosed. 1st Mrg lesion is 50% stenosed.   1.  Mild nonobstructive coronary artery disease. 2.  Right heart catheterization showed low filling pressures, normal pulmonary pressure and normal cardiac output.  No evidence of significant mitral regurgitation based on waveforms.  Assessment & Plan   1.  Hypertension: Blood pressure is not well-controlled.  I have given him samples of Farxiga 10 mg daily, new prescription, discount card and patient assistance form for cost.  Carvedilol is prescribed as well.  He will come back in 1 month for reevaluation of blood pressure control and response to medication.  2.  Chronic systolic CHF: Continue GDMT.  New prescriptions provided along with samples as discussed above.  Follow-up CMET is ordered for evaluation of kidney function.  He is advised on low-sodium diet and daily weights and to increase activity with purposeful exercise  3.  Nonobstructive CAD: Continue secondary prevention with blood pressure control, weight loss, statin therapy, and purposeful exercise.  4.  Hypercholesterolemia: Goal of LDL less than 70.  Remains on atorvastatin 40 mg daily.  Follow-up lipids will be completed with CMet after fasting.    Current medicines are reviewed at length with the patient today.  I have spent 25 min's  dedicated to the care of this patient on the date of this encounter to include pre-visit review of records, assessment, management and diagnostic testing,with shared decision making. Signed, Phill Myron. West Pugh, ANP, AACC   01/17/2023 5:15 PM      Office [phone] Fax [phone]  Notice: This dictation was prepared with Dragon dictation along with smaller phrase technology. Any transcriptional errors that result from this process are unintentional and may not be corrected upon review.

## 2023-01-17 ENCOUNTER — Encounter: Payer: Self-pay | Admitting: Adult Health

## 2023-01-17 ENCOUNTER — Ambulatory Visit: Payer: No Typology Code available for payment source | Attending: Cardiovascular Disease | Admitting: Adult Health

## 2023-01-17 VITALS — BP 164/76 | HR 71 | Ht 67.0 in | Wt 247.8 lb

## 2023-01-17 DIAGNOSIS — E785 Hyperlipidemia, unspecified: Secondary | ICD-10-CM | POA: Diagnosis not present

## 2023-01-17 DIAGNOSIS — E7800 Pure hypercholesterolemia, unspecified: Secondary | ICD-10-CM | POA: Diagnosis not present

## 2023-01-17 DIAGNOSIS — I5022 Chronic systolic (congestive) heart failure: Secondary | ICD-10-CM

## 2023-01-17 DIAGNOSIS — I10 Essential (primary) hypertension: Secondary | ICD-10-CM

## 2023-01-17 MED ORDER — DAPAGLIFLOZIN PROPANEDIOL 10 MG PO TABS: 10.0000 mg | ORAL_TABLET | Freq: Every day | ORAL | 0 refills | Status: DC

## 2023-01-17 NOTE — Patient Instructions (Signed)
Medication Instructions:  No Changes *If you need a refill on your cardiac medications before your next appointment, please call your pharmacy*   Lab Work: CMET, Lipid Panel  If you have labs (blood work) drawn today and your tests are completely normal, you will receive your results only by: [place] (if you have MyChart) OR A paper copy in the mail If you have any lab test that is abnormal or we need to change your treatment, we will call you to review the results.   Testing/Procedures: No Testing   Follow-Up: At [facility], you and your health needs are our priority.  As part of our continuing mission to provide you with exceptional heart care, we have created designated Provider Care Teams.  These Care Teams include your primary Cardiologist (physician) and Advanced Practice Providers (APPs -  Physician Assistants and Nurse Practitioners) who all work together to provide you with the care you need, when you need it.  We recommend signing up for the patient portal called "MyChart".  Sign up information is provided on this After Visit Summary.  MyChart is used to connect with patients for Virtual Visits (Telemedicine).  Patients are able to view lab/test results, encounter notes, upcoming appointments, etc.  Non-urgent messages can be sent to your provider as well.   To learn more about what you can do with MyChart, go to [email or web address].    Your next appointment:   3 month(s)  Provider:   [place] T. Audie Box, MD

## 2023-01-21 IMAGING — CR DG CHEST 2V
2 series · 2 of 2 positions shown · non-contrast
Comparison: April 11, 2021

CLINICAL DATA: Pleural effusion

EXAM:
CHEST - 2 VIEW

[chest pa]
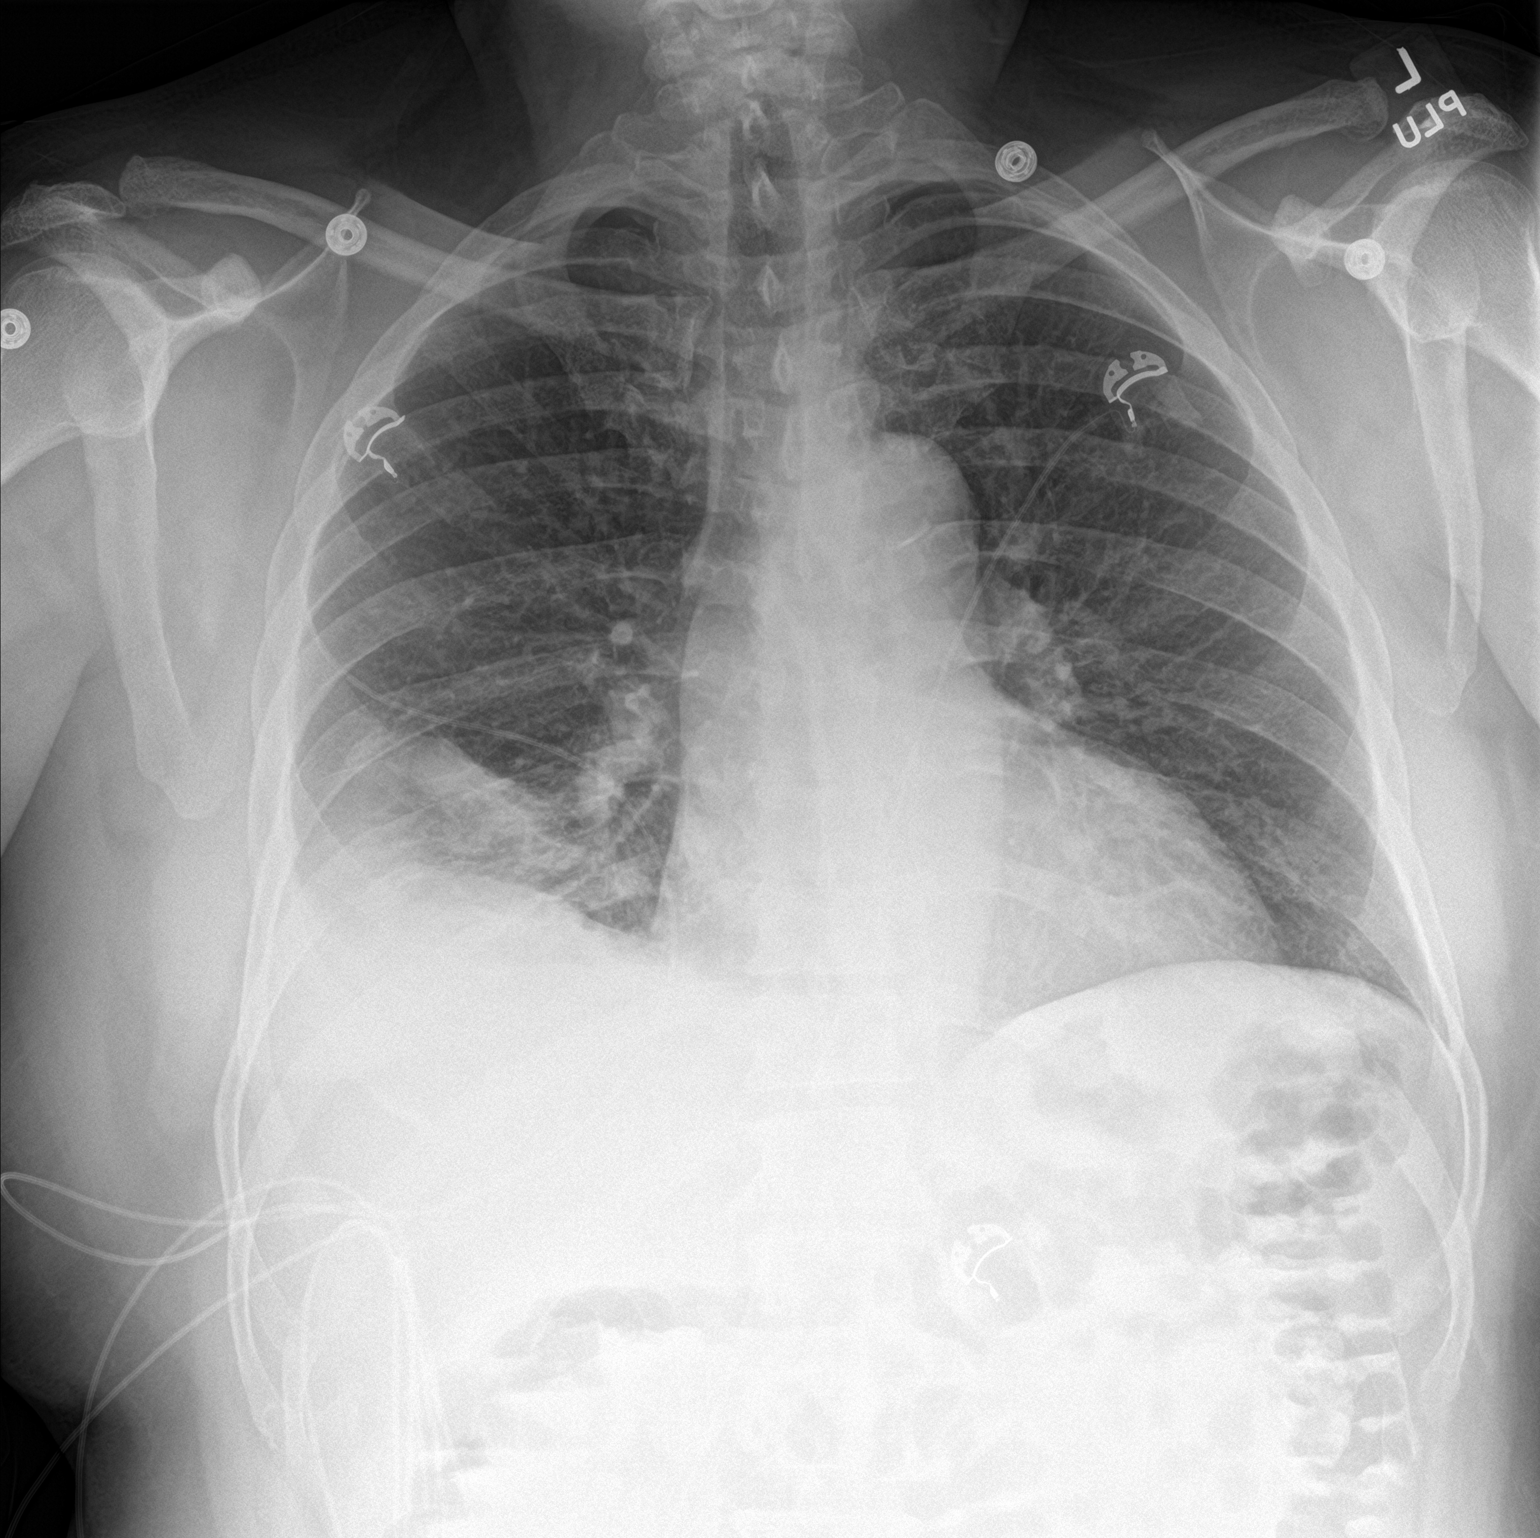

[chest lat]
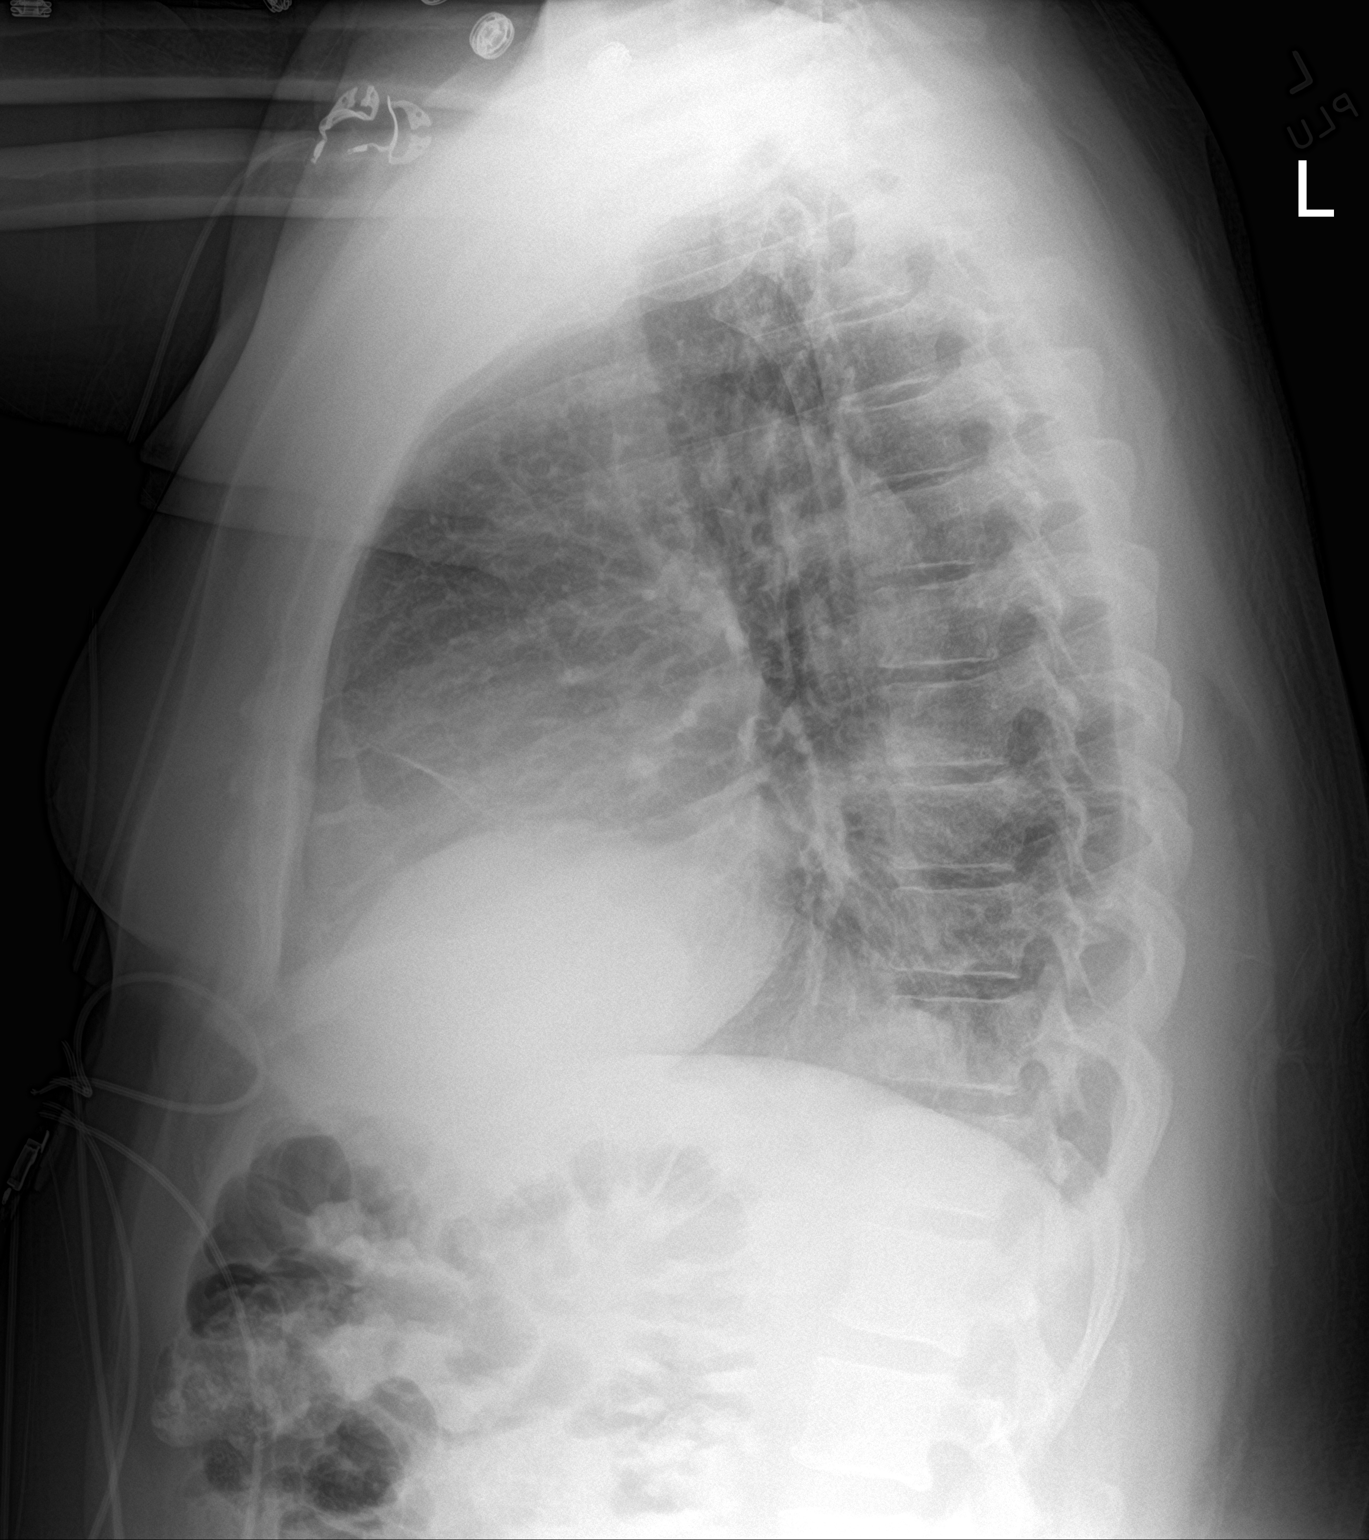

[2 of 2 positions shown; findings below may reference images not displayed]

FINDINGS: There is a persistent small right pleural effusion with airspace
opacity in the right lower lobe and in portions of the right middle
lobe. Left lung is clear. Heart size and pulmonary vascular normal.
No adenopathy. There is aortic atherosclerosis.
IMPRESSION: Small right pleural effusion. Persistent areas of airspace opacity
in the right lower lobe and to a lesser degree in the right middle
lobe. There may be a degree of partial clearing in the right middle
lobe. Left lung clear. Stable cardiac silhouette.

Aortic Atherosclerosis (A65BG-HG2.2).

## 2023-04-29 ENCOUNTER — Ambulatory Visit: Payer: No Typology Code available for payment source | Admitting: Cardiovascular Disease

## 2023-06-08 NOTE — Progress Notes (Deleted)
Cardiology Office Note:   Date:  06/08/2023  NAME:  Andres Roman    MRN: [id number] DOB:  22-Jun-1966   PCP:  Claiborne Rigg, NP  Cardiologist:  None  Electrophysiologist:  None   Referring MD: Claiborne Rigg, NP   No chief complaint on file.   History of Present Illness:   Andres Roman is a 57 y.o. male with a hx of CHF, HTN, HLD who presents for follow-up.   Problem List Systolic CHF -EF 08-65% 03/2021 -EF 50-55% 04/2022 2. Non-obstructive CAD 3. HTN 4. HLD -T chol 240, HDL 35, LDL 180, TG 137 5. PE -04/2021  Past Medical History: Past Medical History:  Diagnosis Date   Acute systolic CHF (congestive heart failure) (HCC) 03/26/2021   CKD (chronic kidney disease) stage 3, GFR 30-59 ml/min (HCC)    Hyperlipidemia    Hypertension    Obese    Pulmonary embolism ([facility])     Past Surgical History: Past Surgical History:  Procedure Laterality Date   APPENDECTOMY     RIGHT/LEFT HEART CATH AND CORONARY ANGIOGRAPHY N/A 03/28/2021   Procedure: RIGHT/LEFT HEART CATH AND CORONARY ANGIOGRAPHY;  Surgeon: Iran Ouch, MD;  Location: [facility];  Service: Cardiovascular;  Laterality: N/A;    Current Medications: No outpatient medications have been marked as taking for the 06/11/23 encounter (Appointment) with O'Neal, Ronnald Ramp, MD.     Allergies:    Patient has no known allergies.   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  Tobacco Use   Smoking status: Never   Smokeless tobacco: Never  Vaping Use   Vaping status: Never Used  Substance and Sexual Activity   Alcohol use: No   Drug use: No   Sexual activity: Yes    Partners: Female    Birth control/protection: None  Other Topics  Concern   Not on file  Social History Narrative   Marital Status:  Separated Marketing executive)    Children:  Daughters (3) Step Son (1)    Pets: None    Living Situation: Lives alone   Occupation:  Naval architect (Occupational psychologist); Part-Time Health and safety inspector)   Education:  Environmental consultant    Tobacco Use/Exposure:  None    Alcohol Use:  None    Drug Use:  None   Diet:  Regular   Exercise:  He does not do formal exercise but his jobs are very physical.     Hobbies:  Basketball                 Social Determinants of Health   Financial Resource Strain: High Risk (03/27/2021)   Overall Financial Resource Strain (CARDIA)    Difficulty of Paying Living Expenses: Hard  Food Insecurity: No Food Insecurity (03/27/2021)   Hunger Vital Sign    Worried About Running Out of Food in the Last Year: Never true    Ran Out of Food in the Last Year: Never true  Transportation Needs: No Transportation Needs (03/27/2021)   PRAPARE - Administrator, Civil Service (Medical): No    Lack of Transportation (Non-Medical): No  Physical Activity: Not on file  Stress: Not on file  Social Connections: Not on file     Family History: The patient's family history includes Diabetes in his brother and mother; Heart disease in his paternal grandmother; Hypertension in his brother and mother; Stroke in his mother.  ROS:   All other ROS reviewed and negative. Pertinent positives noted in the HPI.     EKGs/Labs/Other Studies Reviewed:   The following studies were personally reviewed by me today:  EKG:  EKG is *** ordered today.        TTE 04/29/2022  1. The aortic valve is tricuspid. There is mild thickening of the aortic  valve. Aortic valve regurgitation is eccentric with prominent splay  artifact. Aortic valve regurgitation appears mild to moderate, however  with eccentric component may be moderate  and underestimated. See Recommendations. No aortic stenosis is present.   2. Left ventricular ejection  fraction, by estimation, is 50 to 55%. The  left ventricle has low normal function. The left ventricle has no definite  regional wall motion abnormalities. There is mild left ventricular  hypertrophy. Left ventricular diastolic   parameters were low normal for age. The average left ventricular global  longitudinal strain is -21.9 %. The global longitudinal strain is normal.   3. Right ventricular systolic function is normal. The right ventricular  size is normal. Tricuspid regurgitation signal is inadequate for assessing  PA pressure.   4. The mitral valve is normal in structure. Mild mitral valve  regurgitation. No evidence of mitral stenosis.   5. The inferior vena cava is normal in size with greater than 50%  respiratory variability, suggesting right atrial pressure of 3 mmHg.   Recent Labs: No results found for requested labs within last 365 days.   Recent Lipid Panel    Component Value Date/Time   CHOL 240 (H) 04/11/2022 0940   TRIG 137 04/11/2022 0940   HDL 35 (L) 04/11/2022 0940   CHOLHDL 6.9 (H) 04/11/2022 0940   CHOLHDL 6.0 03/26/2021 0919   VLDL 19 03/26/2021 0919   LDLCALC 180 (H) 04/11/2022 0940    Physical Exam:   VS:  There were no vitals taken for this visit.   Wt Readings from Last 3 Encounters:  01/17/23 247 lb 12.8 oz (112.4 kg)  05/23/22 252 lb 9.6 oz (114.6 kg)  04/11/22 252 lb (114.3 kg)    General: Well nourished, well developed, in no acute distress Head: Atraumatic, normal size  Eyes: PEERLA, EOMI  Neck: Supple, no JVD Endocrine: No thryomegaly Cardiac: Normal S1, S2; RRR; no murmurs, rubs, or gallops Lungs: Clear to auscultation bilaterally, no wheezing, rhonchi or rales  Abd: Soft, nontender, no hepatomegaly  Ext: No edema, pulses 2+ Musculoskeletal: No deformities, BUE and BLE strength normal and equal Skin: Warm and dry, no rashes   Neuro: Alert and oriented to person, place, time, and situation, CNII-XII grossly intact, no focal deficits   Psych: Normal mood and affect   ASSESSMENT:   Andres Roman is a 57 y.o. male who presents for the following: No diagnosis found.  PLAN:   There are no diagnoses linked to this encounter.  {Are you ordering a CV Procedure (e.g. stress test, cath, DCCV, TEE, etc)?   Press F2        :[id number]}  Disposition: No follow-ups on file.  Medication Adjustments/Labs and Tests Ordered: Current medicines are reviewed at length with the patient today.  Concerns regarding medicines are outlined above.  No orders of the defined types were placed in this encounter.  No orders  of the defined types were placed in this encounter.  There are no Patient Instructions on file for this visit.   Time Spent with Patient: I have spent a total of *** minutes with patient reviewing hospital notes, telemetry, EKGs, labs and examining the patient as well as establishing an assessment and plan that was discussed with the patient.  > 50% of time was spent in direct patient care.  Signed, Lenna Gilford. Flora Lipps, MD, [facility]  [facility]  [address] [place], [place] [address] [phone]  06/08/2023 7:26 PM

## 2023-06-11 ENCOUNTER — Ambulatory Visit: Payer: No Typology Code available for payment source | Admitting: Cardiovascular Disease

## 2023-06-11 DIAGNOSIS — I10 Essential (primary) hypertension: Secondary | ICD-10-CM

## 2023-06-11 DIAGNOSIS — I5022 Chronic systolic (congestive) heart failure: Secondary | ICD-10-CM

## 2023-06-11 DIAGNOSIS — I2510 Atherosclerotic heart disease of native coronary artery without angina pectoris: Secondary | ICD-10-CM

## 2023-06-11 DIAGNOSIS — E7800 Pure hypercholesterolemia, unspecified: Secondary | ICD-10-CM

## 2023-07-10 ENCOUNTER — Ambulatory Visit: Payer: No Typology Code available for payment source | Admitting: Adult Health

## 2023-08-29 NOTE — Progress Notes (Deleted)
Cardiology Office Note:  .   Date:  08/29/2023  ID:  Andres Roman, DOB 02/24/1966, MRN [id number] PCP: Claiborne Rigg, NP  [facility] Providers Cardiologist:  Reatha Harps, MD { }   History of Present Illness: [name]   Andres Roman is a 57 y.o. male history of nonobstructive CAD, chronic systolic heart failure, with normalized LVEF of 50 to 55% with mild to moderate aortic valve regurgitation, hypertension, hyperlipidemia, and PE.  Last seen by me on 01/17/2023, blood pressure was not well-controlled, and therefore Farxiga 10 mg was ordered along with carvedilol was continued at 25 mg twice daily..   ROS: ***  Studies Reviewed: .       Echocardiogram 04/29/2022  1. The aortic valve is tricuspid. There is mild thickening of the aortic  valve. Aortic valve regurgitation is eccentric with prominent splay  artifact. Aortic valve regurgitation appears mild to moderate, however  with eccentric component may be moderate  and underestimated. See Recommendations. No aortic stenosis is present.   2. Left ventricular ejection fraction, by estimation, is 50 to 55%. The  left ventricle has low normal function. The left ventricle has no definite  regional wall motion abnormalities. There is mild left ventricular  hypertrophy. Left ventricular diastolic   parameters were low normal for age. The average left ventricular global  longitudinal strain is -21.9 %. The global longitudinal strain is normal.   3. Right ventricular systolic function is normal. The right ventricular  size is normal. Tricuspid regurgitation signal is inadequate for assessing  PA pressure.   4. The mitral valve is normal in structure. Mild mitral valve  regurgitation. No evidence of mitral stenosis.   5. The inferior vena cava is normal in size with greater than 50%  respiratory variability, suggesting right atrial pressure of 3 mmHg.    *** EKG Interpretation Date/Time:    Ventricular Rate:    PR Interval:     QRS Duration:    QT Interval:    QTC Calculation:   R Axis:      Text Interpretation:     Physical Exam:   VS:  There were no vitals taken for this visit.   Wt Readings from Last 3 Encounters:  01/17/23 247 lb 12.8 oz (112.4 kg)  05/23/22 252 lb 9.6 oz (114.6 kg)  04/11/22 252 lb (114.3 kg)    GEN: Well nourished, well developed in no acute distress NECK: No JVD; No carotid bruits CARDIAC: ***RRR, no murmurs, rubs, gallops RESPIRATORY:  Clear to auscultation without rales, wheezing or rhonchi  ABDOMEN: Soft, non-tender, non-distended EXTREMITIES:  No edema; No deformity   ASSESSMENT AND PLAN: .   ***    {Are you ordering a CV Procedure (e.g. stress test, cath, DCCV, TEE, etc)?   Press F2        :[id number]}  Dispo: ***  Signed, Bettey Mare. Liborio Nixon, ANP, AACC

## 2023-09-01 ENCOUNTER — Ambulatory Visit: Payer: No Typology Code available for payment source | Admitting: Adult Health

## 2023-09-19 ENCOUNTER — Ambulatory Visit: Payer: No Typology Code available for payment source | Admitting: Adult Health

## 2023-10-19 NOTE — Progress Notes (Unsigned)
  Cardiology Office Note:  .   Date:  10/19/2023  ID:  Andres Roman, DOB 08/04/1966, MRN [id number] PCP: Claiborne Rigg, NP  [facility] Providers Cardiologist:  Reatha Harps, MD  }   History of Present Illness: [name]   Andres Roman is a 57 y.o. male with history of nonobstructive CAD, chronic systolic heart failure with most recent echocardiogram dated 04/29/2022 revealing normal LVEF of 50 to 55%, mild to moderate aortic valve regurgitation, hypertension, hyperlipidemia, and PE. Continued on GDMT. On last visit 01/17/2023 he was given refills on Farxiga and spironolactone as he had not been taking it.    ROS: ***  Studies Reviewed: .      Echocardiogram 04/29/2022 1. The aortic valve is tricuspid. There is mild thickening of the aortic  valve. Aortic valve regurgitation is eccentric with prominent splay  artifact. Aortic valve regurgitation appears mild to moderate, however  with eccentric component may be moderate  and underestimated. See Recommendations. No aortic stenosis is present.   2. Left ventricular ejection fraction, by estimation, is 50 to 55%. The  left ventricle has low normal function. The left ventricle has no definite  regional wall motion abnormalities. There is mild left ventricular  hypertrophy. Left ventricular diastolic   parameters were low normal for age. The average left ventricular global  longitudinal strain is -21.9 %. The global longitudinal strain is normal.   3. Right ventricular systolic function is normal. The right ventricular  size is normal. Tricuspid regurgitation signal is inadequate for assessing  PA pressure.   4. The mitral valve is normal in structure. Mild mitral valve  regurgitation. No evidence of mitral stenosis.   5. The inferior vena cava is normal in size with greater than 50%  respiratory variability, suggesting right atrial pressure of 3 mmHg.    Right and Left Heart Cath 03/28/2021 Prox RCA lesion is 20% stenosed. RPAV  lesion is 20% stenosed. Dist RCA lesion is 20% stenosed. Ost Cx to Mid Cx lesion is 20% stenosed. Ramus lesion is 30% stenosed. 1st Mrg lesion is 50% stenosed.   1.  Mild nonobstructive coronary artery disease. 2.  Right heart catheterization showed low filling pressures, normal pulmonary pressure and normal cardiac output.  No evidence of significant mitral regurgitation based on waveforms.  *** EKG Interpretation Date/Time:    Ventricular Rate:    PR Interval:    QRS Duration:    QT Interval:    QTC Calculation:   R Axis:      Text Interpretation:      Physical Exam:   VS:  There were no vitals taken for this visit.   Wt Readings from Last 3 Encounters:  01/17/23 247 lb 12.8 oz (112.4 kg)  05/23/22 252 lb 9.6 oz (114.6 kg)  04/11/22 252 lb (114.3 kg)    GEN: Well nourished, well developed in no acute distress NECK: No JVD; No carotid bruits CARDIAC: ***RRR, no murmurs, rubs, gallops RESPIRATORY:  Clear to auscultation without rales, wheezing or rhonchi  ABDOMEN: Soft, non-tender, non-distended EXTREMITIES:  No edema; No deformity   ASSESSMENT AND PLAN: .   ***    {Are you ordering a CV Procedure (e.g. stress test, cath, DCCV, TEE, etc)?   Press F2        :[id number]}    Signed, Bettey Mare. Liborio Nixon, ANP, AACC

## 2023-10-20 ENCOUNTER — Ambulatory Visit: Payer: No Typology Code available for payment source | Admitting: Adult Health

## 2023-11-18 NOTE — Progress Notes (Deleted)
  Cardiology Office Note:  .   Date:  11/18/2023  ID:  Andres Roman, DOB June 22, 1966, MRN [id number] PCP: Theotis Haze ORN, NP  [facility] Providers Cardiologist:  Darryle ONEIDA Decent, MD  }   History of Present Illness: Andres   Koron Roman is a 58 y.o. male with history of nonobstructive CAD, chronic systolic heart failure with most recent echocardiogram dated 04/29/2022 revealing normal LVEF of 50 to 55%, mild to moderate aortic valve regurgitation, hypertension, hyperlipidemia, and PE. Continued on GDMT. On last visit 01/17/2023 he was given refills on Farxiga  and spironolactone  as he had not been taking it.    ROS: ***  Studies Reviewed: .      Echocardiogram 04/29/2022 1. The aortic valve is tricuspid. There is mild thickening of the aortic  valve. Aortic valve regurgitation is eccentric with prominent splay  artifact. Aortic valve regurgitation appears mild to moderate, however  with eccentric component may be moderate  and underestimated. See Recommendations. No aortic stenosis is present.   2. Left ventricular ejection fraction, by estimation, is 50 to 55%. The  left ventricle has low normal function. The left ventricle has no definite  regional wall motion abnormalities. There is mild left ventricular  hypertrophy. Left ventricular diastolic   parameters were low normal for age. The average left ventricular global  longitudinal strain is -21.9 %. The global longitudinal strain is normal.   3. Right ventricular systolic function is normal. The right ventricular  size is normal. Tricuspid regurgitation signal is inadequate for assessing  PA pressure.   4. The mitral valve is normal in structure. Mild mitral valve  regurgitation. No evidence of mitral stenosis.   5. The inferior vena cava is normal in size with greater than 50%  respiratory variability, suggesting right atrial pressure of 3 mmHg.    Right and Left Heart Cath 03/28/2021 Prox RCA lesion is 20% stenosed. RPAV  lesion is 20% stenosed. Dist RCA lesion is 20% stenosed. Ost Cx to Mid Cx lesion is 20% stenosed. Ramus lesion is 30% stenosed. 1st Mrg lesion is 50% stenosed.   1.  Mild nonobstructive coronary artery disease. 2.  Right heart catheterization showed low filling pressures, normal pulmonary pressure and normal cardiac output.  No evidence of significant mitral regurgitation based on waveforms.  *** EKG Interpretation Date/Time:    Ventricular Rate:    PR Interval:    QRS Duration:    QT Interval:    QTC Calculation:   R Axis:      Text Interpretation:      Physical Exam:   VS:  There were no vitals taken for this visit.   Wt Readings from Last 3 Encounters:  01/17/23 247 lb 12.8 oz (112.4 kg)  05/23/22 252 lb 9.6 oz (114.6 kg)  04/11/22 252 lb (114.3 kg)    GEN: Well nourished, well developed in no acute distress NECK: No JVD; No carotid bruits CARDIAC: ***RRR, no murmurs, rubs, gallops RESPIRATORY:  Clear to auscultation without rales, wheezing or rhonchi  ABDOMEN: Soft, non-tender, non-distended EXTREMITIES:  No edema; No deformity   ASSESSMENT AND PLAN: .   ***    {Are you ordering a CV Procedure (e.g. stress test, cath, DCCV, TEE, etc)?   Press F2        :[id number]}    Signed, Lamarr HERO. Jerilynn CHOL, ANP, AACC

## 2023-11-21 ENCOUNTER — Ambulatory Visit: Payer: No Typology Code available for payment source | Admitting: Adult Health

## 2023-11-26 NOTE — Progress Notes (Signed)
Cardiology Office Note    Date:  11/27/2023  ID:  [name], DOB [birth year], MRN [id number] PCP:  Claiborne Rigg, NP  Cardiologist:  Reatha Harps, MD  Electrophysiologist:  None   Chief Complaint: Follow-up for hypertension  History of Present Illness: Andres Roman    Andres Roman is a 58 y.o. male with visit-pertinent history of nonobstructive CAD, hypertension, hyperlipidemia, PE ,chronic systolic heart failure with most recent echocardiogram on 04/29/2022 revealing normal LVEF of 50 to 55%, mild to moderate aortic valve regurgitation, hypertension, hyperlipidemia, and PE.   In May 2022 he was diagnosed with congestive heart failure, left heart catheterization showed nonobstructive CAD.  It was felt that this heart failure was attributed to hypertension.  He was diagnosed in June 2022 with pulmonary embolism after his heart failure hospitalization, he had completed his Eliquis.  He was last seen by Dr. Flora Lipps on 7/3 13/2023.  His carvedilol and Entresto was increased.  His last office visit on 01/17/2023 he was given refills on Farxiga and spironolactone as he had not been taking it.  He had otherwise remained stable from a cardiac standpoint.  Today he presents for follow-up.  He reports that he is doing very well overall.  He request refills today however he is unsure of what medications he has been taking.  He believes he has only been taking 3 out of the 6 medications that are currently on his med list.  He is able to confirm that he has been taking Entresto however he says he did not take it this morning, and he believes he has been taking [place] however he is unsure if he has been taking carvedilol or spironolactone.  On chart review it appears that the majority of his medications were discontinued in 2023 aside from [place].  ROS: .   Today he denies chest pain, shortness of breath, lower extremity edema, fatigue, palpitations, melena, hematuria, hemoptysis, diaphoresis, weakness,  presyncope, syncope, orthopnea, and PND.  All other systems are reviewed and otherwise negative.  Studies Reviewed: Andres Roman    EKG:  EKG is ordered today, personally reviewed, demonstrating  EKG Interpretation Date/Time:  Thursday November 27 2023 14:04:30 EST Ventricular Rate:  77 PR Interval:  170 QRS Duration:  100 QT Interval:  392 QTC Calculation: 443 R Axis:   22  Text Interpretation: Normal sinus rhythm Minimal voltage criteria for LVH, may be normal variant ( Cornell product ) Nonspecific T wave abnormality Confirmed by Reather Littler [phone]) on 11/27/2023 5:02:20 PM    CV Studies:  Cardiac Studies & Procedures   CARDIAC CATHETERIZATION  CARDIAC CATHETERIZATION 03/28/2021  Narrative  Prox RCA lesion is 20% stenosed.  RPAV lesion is 20% stenosed.  Dist RCA lesion is 20% stenosed.  Ost Cx to Mid Cx lesion is 20% stenosed.  Ramus lesion is 30% stenosed.  1st Mrg lesion is 50% stenosed.  1.  Mild nonobstructive coronary artery disease. 2.  Right heart catheterization showed low filling pressures, normal pulmonary pressure and normal cardiac output.  No evidence of significant mitral regurgitation based on waveforms.  Recommendations: The patient has nonischemic cardiomyopathy.  Recommend medical therapy. He seems to be mildly volume depleted and mildly hypotensive.  He was given 250 mill bolus of normal saline.  Hold furosemide today.  I also held hydralazine and Imdur for the same reason and elected to add small dose carvedilol 3.125 mg twice daily.  He is already on [place] and spironolactone.  Findings Coronary Findings Diagnostic  Dominance:  Right  Left Main The vessel exhibits minimal luminal irregularities.  Left Anterior Descending There is mild diffuse disease throughout the vessel.  Ramus Intermedius Ramus lesion is 30% stenosed.  Left Circumflex Ost Cx to Mid Cx lesion is 20% stenosed.  First Obtuse Marginal Branch 1st Mrg lesion is 50%  stenosed.  Right Coronary Artery Prox RCA lesion is 20% stenosed. Dist RCA lesion is 20% stenosed.  Right Posterior Atrioventricular Artery RPAV lesion is 20% stenosed.  Intervention  No interventions have been documented.    ECHOCARDIOGRAM  ECHOCARDIOGRAM COMPLETE 04/29/2022  Narrative ECHOCARDIOGRAM REPORT    Patient Name:   Andres Roman Date of Exam: 04/29/2022 Medical Rec #:  [id number]     Height:       67.0 in Accession #:    [id number]    Weight:       252.0 lb Date of Birth:  31-Jan-1966     BSA:          2.231 m Patient Age:    56 years      BP:           160/90 mmHg Patient Gender: M             HR:           69 bpm. Exam Location:  [address]  Procedure: 2D Echo, 3D Echo, Cardiac Doppler, Color Doppler and Strain Analysis  Indications:    I50.22 CHF  History:        Patient has prior history of Echocardiogram examinations, most recent 04/09/2021. CHF and Cardiomyopathy; Risk Factors:Hypertension and Dyslipidemia. Pulmonary embolus. CKD stage 3. Pre-diabetes.  Sonographer:    Jorje Guild BS, RDCS Referring Phys: [id number] [name]  IMPRESSIONS   1. The aortic valve is tricuspid. There is mild thickening of the aortic valve. Aortic valve regurgitation is eccentric with prominent splay artifact. Aortic valve regurgitation appears mild to moderate, however with eccentric component may be moderate and underestimated. See Recommendations. No aortic stenosis is present. 2. Left ventricular ejection fraction, by estimation, is 50 to 55%. The left ventricle has low normal function. The left ventricle has no definite regional wall motion abnormalities. There is mild left ventricular hypertrophy. Left ventricular diastolic parameters were low normal for age. The average left ventricular global longitudinal strain is -21.9 %. The global longitudinal strain is normal. 3. Right ventricular systolic function is normal. The right ventricular size is normal.  Tricuspid regurgitation signal is inadequate for assessing PA pressure. 4. The mitral valve is normal in structure. Mild mitral valve regurgitation. No evidence of mitral stenosis. 5. The inferior vena cava is normal in size with greater than 50% respiratory variability, suggesting right atrial pressure of 3 mmHg.  Comparison(s): A prior study was performed on 04/09/21. Prior images reviewed side by side. LVEF has improved.  Conclusion(s)/Recommendation(s): Aortic valve regurgitation may have increased, with prominent eccentric component on this exam compared to prior. BP noted to be 160/90 mmHg. LV remains normal size and now with recovered LV function. Consider cardiac MRI vs TEE for quantitation of eccentric aortic valve regurgitation when BP optimized.  FINDINGS Left Ventricle: Left ventricular ejection fraction, by estimation, is 50 to 55%. The left ventricle has low normal function. The left ventricle has no regional wall motion abnormalities. The average left ventricular global longitudinal strain is -21.9 %. The global longitudinal strain is normal. 3D left ventricular ejection fraction analysis performed but not reported based on interpreter judgement due to suboptimal tracking. The left ventricular  internal cavity size was normal in size. There is mild left ventricular hypertrophy. Left ventricular diastolic parameters were normal.  Right Ventricle: The right ventricular size is normal. No increase in right ventricular wall thickness. Right ventricular systolic function is normal. Tricuspid regurgitation signal is inadequate for assessing PA pressure.  Left Atrium: Left atrial size was normal in size.  Right Atrium: Right atrial size was normal in size.  Pericardium: There is no evidence of pericardial effusion.  Mitral Valve: The mitral valve is normal in structure. Mild mitral valve regurgitation. No evidence of mitral valve stenosis.  Tricuspid Valve: The tricuspid valve is normal  in structure. Tricuspid valve regurgitation is not demonstrated. No evidence of tricuspid stenosis.  Aortic Valve: The aortic valve is tricuspid. There is mild thickening of the aortic valve. Aortic valve regurgitation is mild to moderate. Aortic regurgitation PHT measures 859 msec. No aortic stenosis is present.  Pulmonic Valve: The pulmonic valve was grossly normal. Pulmonic valve regurgitation is trivial. No evidence of pulmonic stenosis.  Aorta: Asc Ao upper limit of normal for age indexed to BSA. The aortic root is normal in size and structure.  Venous: The inferior vena cava is normal in size with greater than 50% respiratory variability, suggesting right atrial pressure of 3 mmHg.  IAS/Shunts: No atrial level shunt detected by color flow Doppler.   LEFT VENTRICLE PLAX 2D LVIDd:         4.70 cm   Diastology LVIDs:         3.40 cm   LV e' medial:    6.53 cm/s LV PW:         1.20 cm   LV E/e' medial:  7.3 LV IVS:        1.10 cm   LV e' lateral:   8.70 cm/s LVOT diam:     2.40 cm   LV E/e' lateral: 5.5 LV SV:         70 LV SV Index:   31        2D Longitudinal Strain LVOT Area:     4.52 cm  2D Strain GLS (A2C):   -20.6 % 2D Strain GLS (A3C):   -24.7 % 2D Strain GLS (A4C):   -20.4 % 2D Strain GLS Avg:     -21.9 %  3D Volume EF: 3D EF:        57 % LV EDV:       180 ml LV ESV:       78 ml LV SV:        102 ml  RIGHT VENTRICLE            IVC RV Basal diam:  3.90 cm    IVC diam: 1.09 cm RV S prime:     9.83 cm/s TAPSE (M-mode): 1.9 cm  LEFT ATRIUM             Index        RIGHT ATRIUM           Index LA diam:        4.50 cm 2.02 cm/m   RA Pressure: 3.00 mmHg LA Vol (A2C):   49.6 ml 22.23 ml/m  RA Area:     16.60 cm LA Vol (A4C):   46.7 ml 20.93 ml/m  RA Volume:   45.30 ml  20.31 ml/m LA Biplane Vol: 48.1 ml 21.56 ml/m AORTIC VALVE LVOT Vmax:   74.50 cm/s LVOT Vmean:  47.900 cm/s LVOT VTI:    0.155 m  AI PHT:      859 msec  AORTA Ao Root diam: 3.60 cm Ao Asc  diam:  3.90 cm  MITRAL VALVE               TRICUSPID VALVE Estimated RAP:  3.00 mmHg MV Decel Time: 257 msec MV E velocity: 47.60 cm/s  SHUNTS MV A velocity: 44.70 cm/s  Systemic VTI:  0.16 m MV E/A ratio:  1.06        Systemic Diam: 2.40 cm  Weston Brass MD Electronically signed by Weston Brass MD Signature Date/Time: 04/29/2022/10:33:04 AM    Final             Current Reported Medications:.    Current Meds  Medication Sig   dapagliflozin propanediol (FARXIGA) 10 MG TABS tablet Take 1 tablet (10 mg total) by mouth daily before breakfast.   sacubitril-valsartan (ENTRESTO) 97-103 MG Take 1 tablet by mouth 2 (two) times daily.   Physical Exam:    VS:  BP (!) 162/90   Pulse 77   Ht 5\' 7"  (1.702 m)   Wt 249 lb (112.9 kg)   SpO2 97%   BMI 39.00 kg/m    Wt Readings from Last 3 Encounters:  11/27/23 249 lb (112.9 kg)  01/17/23 247 lb 12.8 oz (112.4 kg)  05/23/22 252 lb 9.6 oz (114.6 kg)    GEN: Well nourished, well developed in no acute distress NECK: No JVD; No carotid bruits CARDIAC: RRR, no murmurs, rubs, gallops RESPIRATORY:  Clear to auscultation without rales, wheezing or rhonchi  ABDOMEN: Soft, non-tender, non-distended EXTREMITIES:  No edema; No acute deformity   Asessement and Plan:.    HTN: Initial blood pressure today 170/98, on recheck was 162/90.  Patient reports that he did not take his Entresto this morning or any of his other medications.  Patient is overall unsure of what medications he is currently taking although notes that he does regularly take his Entresto.  Requested patient call the office to report what medications he is currently taking so that medication adjustments can be made and he can be safely restarted on specific meds.  Discussed the importance of medication compliance. Check CBC and CMET.   Chronic systolic HF: In May 2022 patient was diagnosed with CHF, cardiac catheterization showed nonobstructive CAD.  It was felt that his heart  failure was attributed to hypertension follow-up echo in 04/2022 indicated LVEF of 50 to 55%.  Patient has had questionable adherence to medication in recent years.  Today he denies chest pain, shortness of breath, lower extremity edema.  He is unsure of what medications he is currently taking aside from [place].  Requested he notify the office as noted above. Check CBC and CMET.   Nonobstructive CAD: Cardiac catheterization in May 2022 showed nonobstructive CAD.  Continue secondary prevention with blood pressure control, weight loss, statin therapy and purposeful exercise. Stable with no anginal symptoms. No indication for ischemic evaluation.    Hyperlipidemia: Last lipid profile on 04/11/2022 indicated total cholesterol 240, HDL 35, triglycerides 137 and LDL 180.  Patient unsure if he has continued on Crestor.  Plan for fasting lipid profile on follow-up.    Disposition: F/u with Reather Littler, NP in one month.   Signed, Rip Harbour, NP

## 2023-11-27 ENCOUNTER — Encounter: Payer: Self-pay | Admitting: Cardiology

## 2023-11-27 ENCOUNTER — Ambulatory Visit: Payer: No Typology Code available for payment source | Attending: Cardiology | Admitting: Cardiology

## 2023-11-27 VITALS — BP 162/90 | HR 77 | Ht 67.0 in | Wt 249.0 lb

## 2023-11-27 DIAGNOSIS — I428 Other cardiomyopathies: Secondary | ICD-10-CM | POA: Diagnosis not present

## 2023-11-27 DIAGNOSIS — E785 Hyperlipidemia, unspecified: Secondary | ICD-10-CM

## 2023-11-27 DIAGNOSIS — I10 Essential (primary) hypertension: Secondary | ICD-10-CM | POA: Diagnosis not present

## 2023-11-27 DIAGNOSIS — I5022 Chronic systolic (congestive) heart failure: Secondary | ICD-10-CM | POA: Diagnosis not present

## 2023-11-27 NOTE — Patient Instructions (Signed)
Medication Instructions:  When you get home please check what medications you are taking and call our office to tell us *If you need a refill on your cardiac medications before your next appointment, please call your pharmacy*  Lab Work: Today we are going to draw CBC and CMP If you have labs (blood work) drawn today and your tests are completely normal, you will receive your results only by: MyChart Message (if you have MyChart) OR A paper copy in the mail If you have any lab test that is abnormal or we need to change your treatment, we will call you to review the results.  Testing/Procedures: No testing  Follow-Up: At [facility], you and your health needs are our priority.  As part of our continuing mission to provide you with exceptional heart care, we have created designated Provider Care Teams.  These Care Teams include your primary Cardiologist (physician) and Advanced Practice Providers (APPs -  Physician Assistants and Nurse Practitioners) who all work together to provide you with the care you need, when you need it.  We recommend signing up for the patient portal called "MyChart".  Sign up information is provided on this After Visit Summary.  MyChart is used to connect with patients for Virtual Visits (Telemedicine).  Patients are able to view lab/test results, encounter notes, upcoming appointments, etc.  Non-urgent messages can be sent to your provider as well.   To learn more about what you can do with MyChart, go to [email or web address].    Your next appointment:   1 month(s)  Provider:   Reather Littler, NP  Other Instructions Please take your blood pressure daily for 2 weeks and bring to next appointment   HOW TO TAKE YOUR BLOOD PRESSURE: Rest 5 minutes before taking your blood pressure. Don't smoke or drink caffeinated beverages for at least 30 minutes before. Take your blood pressure before (not after) you eat. Sit comfortably with your back supported and  both feet on the floor (don't cross your legs). Elevate your arm to heart level on a table or a desk. Use the proper sized cuff. It should fit smoothly and snugly around your bare upper arm. There should be enough room to slip a fingertip under the cuff. The bottom edge of the cuff should be 1 inch above the crease of the elbow. Ideally, take 3 measurements at one sitting and record the average.

## 2023-11-28 ENCOUNTER — Telehealth: Payer: Self-pay | Admitting: Cardiovascular Disease

## 2023-11-28 ENCOUNTER — Telehealth: Payer: Self-pay

## 2023-11-28 LAB — CBC
Hematocrit: 47.3 % (ref 37.5–51.0)
Hemoglobin: 15.4 g/dL (ref 13.0–17.7)
MCH: 27.7 pg (ref 26.6–33.0)
MCHC: 32.6 g/dL (ref 31.5–35.7)
MCV: 85 fL (ref 79–97)
Platelets: 242 10*3/uL (ref 150–450)
RBC: 5.56 x10E6/uL (ref 4.14–5.80)
RDW: 13.6 % (ref 11.6–15.4)
WBC: 7.6 10*3/uL (ref 3.4–10.8)

## 2023-11-28 LAB — COMPREHENSIVE METABOLIC PANEL
ALT: 29 [IU]/L (ref 0–44)
AST: 32 [IU]/L (ref 0–40)
Albumin: 4.2 g/dL (ref 3.8–4.9)
Alkaline Phosphatase: 75 [IU]/L (ref 44–121)
BUN/Creatinine Ratio: 10 (ref 9–20)
BUN: 12 mg/dL (ref 6–24)
Bilirubin Total: 0.6 mg/dL (ref 0.0–1.2)
CO2: 25 mmol/L (ref 20–29)
Calcium: 9.4 mg/dL (ref 8.7–10.2)
Chloride: 104 mmol/L (ref 96–106)
Creatinine, Ser: 1.15 mg/dL (ref 0.76–1.27)
Globulin, Total: 3.2 g/dL (ref 1.5–4.5)
Glucose: 82 mg/dL (ref 70–99)
Potassium: 3.8 mmol/L (ref 3.5–5.2)
Sodium: 140 mmol/L (ref 134–144)
Total Protein: 7.4 g/dL (ref 6.0–8.5)
eGFR: 74 mL/min/{1.73_m2} (ref >59)

## 2023-11-28 NOTE — Telephone Encounter (Signed)
-----   Message from [name] sent at 11/28/2023  1:32 PM EST ----- Please let Andres Roman know that his CBC shows no evidence of anemia nor infection. His kidney function is normal and electrolytes are normal. Follow up as planned.

## 2023-11-28 NOTE — Telephone Encounter (Signed)
Patient was supposed to call back to give 3 medications that he is taking to Dr. Flora Lipps. They are   Andres Roman

## 2023-11-28 NOTE — Telephone Encounter (Signed)
Called patient advised of below they verbalized understanding.

## 2023-12-02 MED ORDER — DAPAGLIFLOZIN PROPANEDIOL 10 MG PO TABS: 10.0000 mg | ORAL_TABLET | Freq: Every day | ORAL | 3 refills | Status: AC

## 2023-12-02 MED ORDER — CARVEDILOL 6.25 MG PO TABS: 6.2500 mg | ORAL_TABLET | Freq: Two times a day (BID) | ORAL | 3 refills | Status: AC

## 2023-12-02 MED ORDER — SACUBITRIL-VALSARTAN 97-103 MG PO TABS: 1.0000 | ORAL_TABLET | Freq: Two times a day (BID) | ORAL | 3 refills | Status: AC

## 2023-12-02 NOTE — Telephone Encounter (Signed)
Please provide refills of his [place] C[place] and rosuvastatin for one year. He needs to start carvedilol 6.25 mg twice daily. Please remind him to monitor his blood pressure at home and to bring a blood pressure log to follow up. This will allow [place] to adjust his medications. Thank you!   Per Reather Littler NP

## 2023-12-02 NOTE — Telephone Encounter (Signed)
Called patient advised of below they verbalized understanding Sent in the Farxiga, Carvedilol and Entresto Patient stated they have enough of the Rosuvastatin.

## 2023-12-26 LAB — LAB REPORT - SCANNED
Calcium: 9
EGFR: 68

## 2024-01-04 NOTE — Progress Notes (Unsigned)
 Cardiology Office Note    Date:  01/05/2024  ID:  Andres Roman, DOB Sep 30, 1966, MRN [id number] PCP:  Andres Rigg, NP  Cardiologist:  Reatha Harps, MD  Electrophysiologist:  None   Chief Complaint: Follow up for HTN   History of Present Illness: [name]    Andres Roman is a 58 y.o. male with visit-pertinent history of nonobstructive CAD, hypertension, hyperlipidemia, PE ,chronic systolic heart failure with most recent echocardiogram on 04/29/2022 revealing normal LVEF of 50 to 55%, mild to moderate aortic valve regurgitation, hypertension, hyperlipidemia, and PE.   In May 2022 he was diagnosed with congestive heart failure, left heart catheterization showed nonobstructive CAD.  It was felt that this heart failure was attributed to hypertension.  He was diagnosed in June 2022 with pulmonary embolism after his heart failure hospitalization, he had completed his Eliquis.  He was last seen by Dr. Flora Lipps on 7/3 13/2023.  His carvedilol and Entresto was increased.  His last office visit on 01/17/2023 he was given refills on Farxiga and spironolactone as he had not been taking it.  He had otherwise remained stable from a cardiac standpoint.  Patient was last seen in clinic on 11/27/2023.  He reports that he is doing very well overall.  He was requesting refills however was unsure of what medications he had been taking.  On chart review on 2/14 patient presented to [facility] with hematuria, found to be hypertensive with blood pressure of 201/124.   Today he presents for follow-up.  He reports that he is doing well. He denies chest pain, shortness of breath, lower extremity edema, orthopnea or PND.  He reports that his home blood pressure readings have been ranging from 150-180's systolic, notes that he is not taking his blood pressure at regular intervals and staggers when he takes his blood pressure medication.  He denies any headaches, blurry vision or double vision.  He reports that he has no longer  experienced any hematuria, plans to follow-up with his PCP regarding this.  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  Labwork independently reviewed: 12/25/2022: Hemoglobin 14.7, hematocrit 44.3, potassium 3.6, creatinine 1.23, AST 26, ALT 25 ROS: .   Today he denies chest pain, shortness of breath, lower extremity edema, fatigue, palpitations, melena, hematuria, hemoptysis, diaphoresis, weakness, presyncope, syncope, orthopnea, and PND.  All other systems are reviewed and otherwise negative. Studies Reviewed: [name]    EKG:  EKG is not ordered today.  CV Studies:  Cardiac Studies & Procedures   ______________________________________________________________________________________________ CARDIAC CATHETERIZATION  CARDIAC CATHETERIZATION 03/28/2021  Narrative  Prox RCA lesion is 20% stenosed.  RPAV lesion is 20% stenosed.  Dist RCA lesion is 20% stenosed.  Ost Cx to Mid Cx lesion is 20% stenosed.  Ramus lesion is 30% stenosed.  1st Mrg lesion is 50% stenosed.  1.  Mild nonobstructive coronary artery disease. 2.  Right heart catheterization showed low filling pressures, normal pulmonary pressure and normal cardiac output.  No evidence of significant mitral regurgitation based on waveforms.  Recommendations: The patient has nonischemic cardiomyopathy.  Recommend medical therapy. He seems to be mildly volume depleted and mildly hypotensive.  He was given 250 mill bolus of normal saline.  Hold furosemide today.  I also held hydralazine and Imdur for the same reason and elected to add small dose carvedilol 3.125 mg twice daily.  He is already on [place] and spironolactone.  Findings Coronary Findings Diagnostic  Dominance: Right  Left Main The vessel exhibits minimal luminal irregularities.  Left Anterior Descending There is mild diffuse disease throughout the vessel.  Ramus Intermedius Ramus lesion is 30%  stenosed.  Left Circumflex Ost Cx to Mid Cx lesion is 20% stenosed.  First Obtuse Marginal Branch 1st Mrg lesion is 50% stenosed.  Right Coronary Artery Prox RCA lesion is 20% stenosed. Dist RCA lesion is 20% stenosed.  Right Posterior Atrioventricular Artery RPAV lesion is 20% stenosed.  Intervention  No interventions have been documented.     ECHOCARDIOGRAM  ECHOCARDIOGRAM COMPLETE 04/29/2022  Narrative ECHOCARDIOGRAM REPORT    Patient Name:   Andres Roman Date of Exam: 04/29/2022 Medical Rec #:  [id number]     Height:       67.0 in Accession #:    [id number]    Weight:       252.0 lb Date of Birth:  21-Nov-1965     BSA:          2.231 m Patient Age:    56 years      BP:           160/90 mmHg Patient Gender: M             HR:           69 bpm. Exam Location:  [address]  Procedure: 2D Echo, 3D Echo, Cardiac Doppler, Color Doppler and Strain Analysis  Indications:    I50.22 CHF  History:        Patient has prior history of Echocardiogram examinations, most recent 04/09/2021. CHF and Cardiomyopathy; Risk Factors:Hypertension and Dyslipidemia. Pulmonary embolus. CKD stage 3. Pre-diabetes.  Sonographer:    Jorje Guild BS, RDCS Referring Phys: [id number] [name]  IMPRESSIONS   1. The aortic valve is tricuspid. There is mild thickening of the aortic valve. Aortic valve regurgitation is eccentric with prominent splay artifact. Aortic valve regurgitation appears mild to moderate, however with eccentric component may be moderate and underestimated. See Recommendations. No aortic stenosis is present. 2. Left ventricular ejection fraction, by estimation, is 50 to 55%. The left ventricle has low normal function. The left ventricle has no definite regional wall motion abnormalities. There is mild left ventricular hypertrophy. Left ventricular diastolic parameters were low normal for age. The average left ventricular global longitudinal strain is -21.9 %. The  global longitudinal strain is normal. 3. Right ventricular systolic function is normal. The right ventricular size is normal. Tricuspid regurgitation signal is inadequate for assessing PA pressure. 4. The mitral valve is normal in structure. Mild mitral valve regurgitation. No evidence of mitral stenosis. 5. The inferior vena cava is normal in size with greater than 50% respiratory variability, suggesting right atrial pressure of 3 mmHg.  Comparison(s): A prior study was performed on 04/09/21. Prior images reviewed side by side. LVEF has improved.  Conclusion(s)/Recommendation(s): Aortic valve regurgitation may have increased, with prominent eccentric component on this exam compared to prior. BP noted to be 160/90 mmHg. LV remains normal size and now with recovered LV function. Consider cardiac MRI vs TEE for quantitation of eccentric aortic valve regurgitation when BP optimized.  FINDINGS Left Ventricle: Left ventricular ejection fraction, by estimation, is 50 to 55%. The left ventricle has low normal function. The left ventricle has no regional wall motion abnormalities. The average left ventricular global longitudinal strain is -21.9 %. The global longitudinal strain is normal. 3D left ventricular ejection fraction analysis performed but not reported based on interpreter judgement due to suboptimal tracking. The  left ventricular internal cavity size was normal in size. There is mild left ventricular hypertrophy. Left ventricular diastolic parameters were normal.  Right Ventricle: The right ventricular size is normal. No increase in right ventricular wall thickness. Right ventricular systolic function is normal. Tricuspid regurgitation signal is inadequate for assessing PA pressure.  Left [facility]: Left atrial size was normal in size.  Right [facility]: Right atrial size was normal in size.  Pericardium: There is no evidence of pericardial effusion.  Mitral Valve: The mitral valve is normal in  structure. Mild mitral valve regurgitation. No evidence of mitral valve stenosis.  Tricuspid Valve: The tricuspid valve is normal in structure. Tricuspid valve regurgitation is not demonstrated. No evidence of tricuspid stenosis.  Aortic Valve: The aortic valve is tricuspid. There is mild thickening of the aortic valve. Aortic valve regurgitation is mild to moderate. Aortic regurgitation PHT measures 859 msec. No aortic stenosis is present.  Pulmonic Valve: The pulmonic valve was grossly normal. Pulmonic valve regurgitation is trivial. No evidence of pulmonic stenosis.  Aorta: Asc Ao upper limit of normal for age indexed to BSA. The aortic root is normal in size and structure.  Venous: The inferior vena cava is normal in size with greater than 50% respiratory variability, suggesting right atrial pressure of 3 mmHg.  IAS/Shunts: No atrial level shunt detected by color flow Doppler.   LEFT VENTRICLE PLAX 2D LVIDd:         4.70 cm   Diastology LVIDs:         3.40 cm   LV e' medial:    6.53 cm/s LV PW:         1.20 cm   LV E/e' medial:  7.3 LV IVS:        1.10 cm   LV e' lateral:   8.70 cm/s LVOT diam:     2.40 cm   LV E/e' lateral: 5.5 LV SV:         70 LV SV Index:   31        2D Longitudinal Strain LVOT Area:     4.52 cm  2D Strain GLS (A2C):   -20.6 % 2D Strain GLS (A3C):   -24.7 % 2D Strain GLS (A4C):   -20.4 % 2D Strain GLS Avg:     -21.9 %  3D Volume EF: 3D EF:        57 % LV EDV:       180 ml LV ESV:       78 ml LV SV:        102 ml  RIGHT VENTRICLE            IVC RV Basal diam:  3.90 cm    IVC diam: 1.09 cm RV S prime:     9.83 cm/s TAPSE (M-mode): 1.9 cm  LEFT ATRIUM             Index        RIGHT ATRIUM           Index LA diam:        4.50 cm 2.02 cm/m   RA Pressure: 3.00 mmHg LA Vol (A2C):   49.6 ml 22.23 ml/m  RA Area:     16.60 cm LA Vol (A4C):   46.7 ml 20.93 ml/m  RA Volume:   45.30 ml  20.31 ml/m LA Biplane Vol: 48.1 ml 21.56 ml/m AORTIC VALVE LVOT  Vmax:   74.50 cm/s LVOT Vmean:  47.900 cm/s LVOT VTI:  0.155 m AI PHT:      859 msec  AORTA Ao Root diam: 3.60 cm Ao Asc diam:  3.90 cm  MITRAL VALVE               TRICUSPID VALVE Estimated RAP:  3.00 mmHg MV Decel Time: 257 msec MV E velocity: 47.60 cm/s  SHUNTS MV A velocity: 44.70 cm/s  Systemic VTI:  0.16 m MV E/A ratio:  1.06        Systemic Diam: 2.40 cm  Weston Brass MD Electronically signed by Weston Brass MD Signature Date/Time: 04/29/2022/10:33:04 AM    Final          ______________________________________________________________________________________________       Current Reported Medications:.    Current Meds  Medication Sig   carvedilol (COREG) 6.25 MG tablet Take 1 tablet (6.25 mg total) by mouth 2 (two) times daily.   dapagliflozin propanediol (FARXIGA) 10 MG TABS tablet Take 1 tablet (10 mg total) by mouth daily before breakfast.   rosuvastatin (CRESTOR) 40 MG tablet Take 1 tablet (40 mg total) by mouth daily.   sacubitril-valsartan (ENTRESTO) 97-103 MG Take 1 tablet by mouth 2 (two) times daily.   [DISCONTINUED] dapagliflozin propanediol (FARXIGA) 10 MG TABS tablet Take 1 tablet (10 mg total) by mouth daily.   [DISCONTINUED] spironolactone (ALDACTONE) 25 MG tablet Take 1 tablet (25 mg total) by mouth daily.   Physical Exam:    VS:  BP (!) 148/90   Pulse 73   Ht 5\' 7"  (1.702 m)   Wt 243 lb 12.8 oz (110.6 kg)   SpO2 96%   BMI 38.18 kg/m    Wt Readings from Last 3 Encounters:  01/05/24 243 lb 12.8 oz (110.6 kg)  11/27/23 249 lb (112.9 kg)  01/17/23 247 lb 12.8 oz (112.4 kg)    GEN: Well nourished, well developed in no acute distress NECK: No JVD; No carotid bruits CARDIAC: RRR, no murmurs, rubs, gallops RESPIRATORY:  Clear to auscultation without rales, wheezing or rhonchi  ABDOMEN: Soft, non-tender, non-distended EXTREMITIES:  No edema; No acute deformity   Asessement and Plan:.    HTN: Blood pressure today 148/90.  Patient  reports that his blood pressure at home ranges from 150-180 systolic, he denies any symptoms with this.  Encourage patient to monitor his blood pressure at regular intervals 2 hours after taking his morning medications and to record on blood pressure log provided.  Requested patient bring his blood pressure cuff to his follow-up appointment for comparison. Patient confirms that he is currently taking carvedilol 6.25 mg twice daily, Farxiga 10 mg daily, Entresto 97-103 mg twice daily.  Will start on spironolactone 25 mg daily. Check CMET in two weeks.   Chronic systlic HF: Patient previously diagnosed with CHF in May 2022, cardiac catheterization at that time showed nonobstructive CAD.  Was felt that his heart failure was attributed to hypertension, follow-up echo in 04/2022 indicated LVEF of 50 to 55%.  Patient has had a questional adherence to medications, stressed with patient the importance of adherence to his medications.  Today he denies chest pain, shortness of breath, lower extremity edema, patient reports that he is feeling very well.  Continue carvedilol, [place] Custard.  Start spironolactone, patient notes that he was previously prescribed Lasix as needed, notes that he has not required this but would appreciate refill.   Nonobstructive CAD: Cardiac catheterization in May 2022 showed nonobstructive CAD.  Encouraged to continue secondary prevention with blood pressure control, statin therapy, weight loss and  purposeful exercise. Stable with no anginal symptoms. No indication for ischemic evaluation.  Reviewed ED precautions.  Continue medications as noted above.  Hyperlipidemia: Last lipid profile on 04/11/2022 indicated total cholesterol 240, HDL 35, triglycerides 137 and LDL 180.  Patient reports that he has been compliant with rosuvastatin 40 mg daily.  Check c-Met and fasting lipid profile in 2 weeks.    Disposition: F/u with Reather Littler, NP in 4-6 weeks   Signed, Rip Harbour, NP

## 2024-01-05 ENCOUNTER — Encounter: Payer: Self-pay | Admitting: Cardiology

## 2024-01-05 ENCOUNTER — Ambulatory Visit: Payer: No Typology Code available for payment source | Attending: Cardiology | Admitting: Cardiology

## 2024-01-05 VITALS — BP 148/90 | HR 73 | Ht 67.0 in | Wt 243.8 lb

## 2024-01-05 DIAGNOSIS — I428 Other cardiomyopathies: Secondary | ICD-10-CM

## 2024-01-05 DIAGNOSIS — E785 Hyperlipidemia, unspecified: Secondary | ICD-10-CM | POA: Diagnosis not present

## 2024-01-05 DIAGNOSIS — I10 Essential (primary) hypertension: Secondary | ICD-10-CM

## 2024-01-05 DIAGNOSIS — I5022 Chronic systolic (congestive) heart failure: Secondary | ICD-10-CM | POA: Diagnosis not present

## 2024-01-05 MED ORDER — FUROSEMIDE 20 MG PO TABS
20.0000 mg | ORAL_TABLET | Freq: Every day | ORAL | 1 refills | Status: AC | PRN
Start: 2024-01-05 — End: 2024-07-03

## 2024-01-05 MED ORDER — SPIRONOLACTONE 25 MG PO TABS: 25.0000 mg | ORAL_TABLET | Freq: Every day | ORAL | 1 refills | Status: AC

## 2024-01-05 NOTE — Patient Instructions (Signed)
 Medication Instructions:  Start Spironolactone 25 mg once a day  *If you need a refill on your cardiac medications before your next appointment, please call your pharmacy*  Lab Work: In 2 weeks we are going to need to draw a fasting lipid panel and Cmet If you have labs (blood work) drawn today and your tests are completely normal, you will receive your results only by: MyChart Message (if you have MyChart) OR A paper copy in the mail If you have any lab test that is abnormal or we need to change your treatment, we will call you to review the results.  Testing/Procedures: No testing  Follow-Up: At [facility], you and your health needs are our priority.  As part of our continuing mission to provide you with exceptional heart care, we have created designated Provider Care Teams.  These Care Teams include your primary Cardiologist (physician) and Advanced Practice Providers (APPs -  Physician Assistants and Nurse Practitioners) who all work together to provide you with the care you need, when you need it.  We recommend signing up for the patient portal called "MyChart".  Sign up information is provided on this After Visit Summary.  MyChart is used to connect with patients for Virtual Visits (Telemedicine).  Patients are able to view lab/test results, encounter notes, upcoming appointments, etc.  Non-urgent messages can be sent to your provider as well.   To learn more about what you can do with MyChart, go to [email or web address].    Your next appointment:   6 week(s)  Provider:   Reather Littler, NP  Other Instructions BRING BLOOD PRESSURE CUFF TO NEXT APPOINTMENT.  Please take your blood pressure daily for 2 weeks and send in a MyChart message. Please include heart rates. (One message at the end of the 2 weeks).   HOW TO TAKE YOUR BLOOD PRESSURE: Rest 5 minutes before taking your blood pressure. Don't smoke or drink caffeinated beverages for at least 30 minutes before. Take  your blood pressure before (not after) you eat. Sit comfortably with your back supported and both feet on the floor (don't cross your legs). Elevate your arm to heart level on a table or a desk. Use the proper sized cuff. It should fit smoothly and snugly around your bare upper arm. There should be enough room to slip a fingertip under the cuff. The bottom edge of the cuff should be 1 inch above the crease of the elbow. Ideally, take 3 measurements at one sitting and record the average.

## 2024-01-16 LAB — COMPREHENSIVE METABOLIC PANEL
ALT: 25 IU/L (ref 0–44)
AST: 24 IU/L (ref 0–40)
Albumin: 4.3 g/dL (ref 3.8–4.9)
Alkaline Phosphatase: 78 IU/L (ref 44–121)
BUN/Creatinine Ratio: 6 — ABNORMAL LOW (ref 9–20)
BUN: 7 mg/dL (ref 6–24)
Bilirubin Total: 0.8 mg/dL (ref 0.0–1.2)
CO2: 23 mmol/L (ref 20–29)
Calcium: 9.3 mg/dL (ref 8.7–10.2)
Chloride: 106 mmol/L (ref 96–106)
Creatinine, Ser: 1.24 mg/dL (ref 0.76–1.27)
Globulin, Total: 3.3 g/dL (ref 1.5–4.5)
Glucose: 78 mg/dL (ref 70–99)
Potassium: 4.3 mmol/L (ref 3.5–5.2)
Sodium: 144 mmol/L (ref 134–144)
Total Protein: 7.6 g/dL (ref 6.0–8.5)
eGFR: 67 mL/min/{1.73_m2} (ref >59)

## 2024-01-16 LAB — LIPID PANEL
Chol/HDL Ratio: 4.2 ratio (ref 0.0–5.0)
Cholesterol, Total: 158 mg/dL (ref 100–199)
HDL: 38 mg/dL — ABNORMAL LOW (ref >39)
LDL Chol Calc (NIH): 101 mg/dL — ABNORMAL HIGH (ref 0–99)
Triglycerides: 104 mg/dL (ref 0–149)
VLDL Cholesterol Cal: 19 mg/dL (ref 5–40)

## 2024-01-23 ENCOUNTER — Telehealth: Payer: Self-pay

## 2024-01-23 DIAGNOSIS — E7800 Pure hypercholesterolemia, unspecified: Secondary | ICD-10-CM

## 2024-01-23 DIAGNOSIS — E785 Hyperlipidemia, unspecified: Secondary | ICD-10-CM

## 2024-01-23 NOTE — Telephone Encounter (Signed)
-----   Message from [name] sent at 01/19/2024  5:45 PM EDT ----- Please let Andres Roman know his lab work indicates normal kidney function and electrolytes.  His liver function is normal.  His LDL or bad cholesterol has improved compared to a year ago however remains above goal.  Recommend referral to Pharm.D. lipid clinic for further recommendations.

## 2024-02-15 NOTE — Progress Notes (Deleted)
 Cardiology Office Note    Date:  02/15/2024  ID:  Andres Roman, DOB 11-27-1965, MRN [id number] PCP:  Andres Rigg, NP  Cardiologist:  Andres Harps, MD  Electrophysiologist:  None   Chief Complaint: ***  History of Present Illness: Andres Roman Kitchen    Andres Roman is a 58 y.o. male with visit-pertinent history of nonobstructive CAD, hypertension, hyperlipidemia, PE ,chronic systolic heart failure with most recent echocardiogram on 04/29/2022 revealing normal LVEF of 50 to 55%, mild to moderate aortic valve regurgitation, hypertension, hyperlipidemia, and PE.   In May 2022 he was diagnosed with congestive heart failure, left heart catheterization showed nonobstructive CAD.  It was felt that this heart failure was attributed to hypertension.  He was diagnosed in June 2022 with pulmonary embolism after his heart failure hospitalization, he had completed his Eliquis.  He was last seen by Dr. Flora Roman on 7/3 13/2023.  His carvedilol and Entresto was increased.  His last office visit on 01/17/2023 he was given refills on Farxiga and spironolactone as he had not been taking it.  He had otherwise remained stable from a cardiac standpoint.  Patient was last seen in clinic on 11/27/2023.  He reports that he is doing very well overall.  He was requesting refills however was unsure of what medications he had been taking.  On chart review on 2/14 patient presented to [facility] with hematuria, found to be hypertensive with blood pressure of 201/124.    Patient was last in clinic on 01/05/2024, he reported that he was doing well.  He denied any further blood in his urine.  Patient was started on spironolactone 25 mg daily.  Today he presents for follow-up.  He reports that he  HTN: Blood pressure today Patient reports that his home blood pressure log Continue carvedilol 6.25 mg twice daily, Farxiga 10 mg daily, Entresto 97-23 mg twice daily and spironolactone 25 mg daily.  Check c-Met. Chronic systolic heart failure:  Previously diagnosed with CHF in May 2022, cardiac catheterization at that time showed nonobstructive CAD.  It is felt that his heart failure was attributed to hypertension, follow-up echo in 04/2022 indicated LVEF of 50 to 55%.  Patient has had a questional adherence to medications, again stressed importance of adherence to medications with patient. Today he appears euvolemic and well compensated on exam. Nonobstructive CAD: Cardiac catheterization in May 2022 showed nonobstructive CAD.  Encouraged to continue secondary prevention with blood pressure control, statin therapy, weight loss and purposeful exercise. Stable with no anginal symptoms. No indication for ischemic evaluation.   Hyperlipidemia: Last lipid profile on 01/15/2024 indicated total cholesterol 158, HDL 38, triglycerides 104 and LDL 101.  Patient was referred to [facility] lipid clinic for better lipid control.  Labwork independently reviewed: 01/15/2024: Sodium 144, potassium 4.3, creatinine 1.24, AST 24, ALT 25  ROS: .   *** denies chest pain, shortness of breath, lower extremity edema, fatigue, palpitations, melena, hematuria, hemoptysis, diaphoresis, weakness, presyncope, syncope, orthopnea, and PND.  All other systems are reviewed and otherwise negative.  Studies Reviewed: Andres Roman Kitchen    EKG:  EKG is ordered today, personally reviewed, demonstrating ***     CV Studies: Cardiac studies reviewed are outlined and summarized above. Otherwise please see EMR for full report. Cardiac Studies & Procedures   ______________________________________________________________________________________________ CARDIAC CATHETERIZATION  CARDIAC CATHETERIZATION 03/28/2021  Narrative  Prox RCA lesion is 20% stenosed.  RPAV lesion is 20% stenosed.  Dist RCA lesion is 20% stenosed.  Ost Cx to Mid Cx lesion is 20%  stenosed.  Ramus lesion is 30% stenosed.  1st Mrg lesion is 50% stenosed.  1.  Mild nonobstructive coronary artery disease. 2.  Right  heart catheterization showed low filling pressures, normal pulmonary pressure and normal cardiac output.  No evidence of significant mitral regurgitation based on waveforms.  Recommendations: The patient has nonischemic cardiomyopathy.  Recommend medical therapy. He seems to be mildly volume depleted and mildly hypotensive.  He was given 250 mill bolus of normal saline.  Hold furosemide today.  I also held hydralazine and Imdur for the same reason and elected to add small dose carvedilol 3.125 mg twice daily.  He is already on [place] and spironolactone.  Findings Coronary Findings Diagnostic  Dominance: Right  Left Main The vessel exhibits minimal luminal irregularities.  Left Anterior Descending There is mild diffuse disease throughout the vessel.  Ramus Intermedius Ramus lesion is 30% stenosed.  Left Circumflex Ost Cx to Mid Cx lesion is 20% stenosed.  First Obtuse Marginal Branch 1st Mrg lesion is 50% stenosed.  Right Coronary Artery Prox RCA lesion is 20% stenosed. Dist RCA lesion is 20% stenosed.  Right Posterior Atrioventricular Artery RPAV lesion is 20% stenosed.  Intervention  No interventions have been documented.     ECHOCARDIOGRAM  ECHOCARDIOGRAM COMPLETE 04/29/2022  Narrative ECHOCARDIOGRAM REPORT    Patient Name:   Andres Roman Date of Exam: 04/29/2022 Medical Rec #:  [id number]     Height:       67.0 in Accession #:    [id number]    Weight:       252.0 lb Date of Birth:  04-Aug-1966     BSA:          2.231 m Patient Age:    56 years      BP:           160/90 mmHg Patient Gender: M             HR:           69 bpm. Exam Location:  [address]  Procedure: 2D Echo, 3D Echo, Cardiac Doppler, Color Doppler and Strain Analysis  Indications:    I50.22 CHF  History:        Patient has prior history of Echocardiogram examinations, most recent 04/09/2021. CHF and Cardiomyopathy; Risk Factors:Hypertension and Dyslipidemia. Pulmonary embolus.  CKD stage 3. Pre-diabetes.  Sonographer:    Andres Roman BS, RDCS Referring Phys: [id number] Andres Roman  IMPRESSIONS   1. The aortic valve is tricuspid. There is mild thickening of the aortic valve. Aortic valve regurgitation is eccentric with prominent splay artifact. Aortic valve regurgitation appears mild to moderate, however with eccentric component may be moderate and underestimated. See Recommendations. No aortic stenosis is present. 2. Left ventricular ejection fraction, by estimation, is 50 to 55%. The left ventricle has low normal function. The left ventricle has no definite regional wall motion abnormalities. There is mild left ventricular hypertrophy. Left ventricular diastolic parameters were low normal for age. The average left ventricular global longitudinal strain is -21.9 %. The global longitudinal strain is normal. 3. Right ventricular systolic function is normal. The right ventricular size is normal. Tricuspid regurgitation signal is inadequate for assessing PA pressure. 4. The mitral valve is normal in structure. Mild mitral valve regurgitation. No evidence of mitral stenosis. 5. The inferior vena cava is normal in size with greater than 50% respiratory variability, suggesting right atrial pressure of 3 mmHg.  Comparison(s): A prior study was performed on 04/09/21. Prior images reviewed  side by side. LVEF has improved.  Conclusion(s)/Recommendation(s): Aortic valve regurgitation may have increased, with prominent eccentric component on this exam compared to prior. BP noted to be 160/90 mmHg. LV remains normal size and now with recovered LV function. Consider cardiac MRI vs TEE for quantitation of eccentric aortic valve regurgitation when BP optimized.  FINDINGS Left Ventricle: Left ventricular ejection fraction, by estimation, is 50 to 55%. The left ventricle has low normal function. The left ventricle has no regional wall motion abnormalities. The average left  ventricular global longitudinal strain is -21.9 %. The global longitudinal strain is normal. 3D left ventricular ejection fraction analysis performed but not reported based on interpreter judgement due to suboptimal tracking. The left ventricular internal cavity size was normal in size. There is mild left ventricular hypertrophy. Left ventricular diastolic parameters were normal.  Right Ventricle: The right ventricular size is normal. No increase in right ventricular wall thickness. Right ventricular systolic function is normal. Tricuspid regurgitation signal is inadequate for assessing PA pressure.  Left [facility]: Left atrial size was normal in size.  Right [facility]: Right atrial size was normal in size.  Pericardium: There is no evidence of pericardial effusion.  Mitral Valve: The mitral valve is normal in structure. Mild mitral valve regurgitation. No evidence of mitral valve stenosis.  Tricuspid Valve: The tricuspid valve is normal in structure. Tricuspid valve regurgitation is not demonstrated. No evidence of tricuspid stenosis.  Aortic Valve: The aortic valve is tricuspid. There is mild thickening of the aortic valve. Aortic valve regurgitation is mild to moderate. Aortic regurgitation PHT measures 859 msec. No aortic stenosis is present.  Pulmonic Valve: The pulmonic valve was grossly normal. Pulmonic valve regurgitation is trivial. No evidence of pulmonic stenosis.  Aorta: Asc Ao upper limit of normal for age indexed to BSA. The aortic root is normal in size and structure.  Venous: The inferior vena cava is normal in size with greater than 50% respiratory variability, suggesting right atrial pressure of 3 mmHg.  IAS/Shunts: No atrial level shunt detected by color flow Doppler.   LEFT VENTRICLE PLAX 2D LVIDd:         4.70 cm   Diastology LVIDs:         3.40 cm   LV e' medial:    6.53 cm/s LV PW:         1.20 cm   LV E/e' medial:  7.3 LV IVS:        1.10 cm   LV e' lateral:   8.70  cm/s LVOT diam:     2.40 cm   LV E/e' lateral: 5.5 LV SV:         70 LV SV Index:   31        2D Longitudinal Strain LVOT Area:     4.52 cm  2D Strain GLS (A2C):   -20.6 % 2D Strain GLS (A3C):   -24.7 % 2D Strain GLS (A4C):   -20.4 % 2D Strain GLS Avg:     -21.9 %  3D Volume EF: 3D EF:        57 % LV EDV:       180 ml LV ESV:       78 ml LV SV:        102 ml  RIGHT VENTRICLE            IVC RV Basal diam:  3.90 cm    IVC diam: 1.09 cm RV S prime:     9.83 cm/s  TAPSE (M-mode): 1.9 cm  LEFT ATRIUM             Index        RIGHT ATRIUM           Index LA diam:        4.50 cm 2.02 cm/m   RA Pressure: 3.00 mmHg LA Vol (A2C):   49.6 ml 22.23 ml/m  RA Area:     16.60 cm LA Vol (A4C):   46.7 ml 20.93 ml/m  RA Volume:   45.30 ml  20.31 ml/m LA Biplane Vol: 48.1 ml 21.56 ml/m AORTIC VALVE LVOT Vmax:   74.50 cm/s LVOT Vmean:  47.900 cm/s LVOT VTI:    0.155 m AI PHT:      859 msec  AORTA Ao Root diam: 3.60 cm Ao Asc diam:  3.90 cm  MITRAL VALVE               TRICUSPID VALVE Estimated RAP:  3.00 mmHg MV Decel Time: 257 msec MV E velocity: 47.60 cm/s  SHUNTS MV A velocity: 44.70 cm/s  Systemic VTI:  0.16 m MV E/A ratio:  1.06        Systemic Diam: 2.40 cm  Weston Brass MD Electronically signed by Weston Brass MD Signature Date/Time: 04/29/2022/10:33:04 AM    Final          ______________________________________________________________________________________________       Current Reported Medications:.    No outpatient medications have been marked as taking for the 02/16/24 encounter (Appointment) with Rip Harbour, NP.    Physical Exam:    VS:  There were no vitals taken for this visit.   Wt Readings from Last 3 Encounters:  01/05/24 243 lb 12.8 oz (110.6 kg)  11/27/23 249 lb (112.9 kg)  01/17/23 247 lb 12.8 oz (112.4 kg)    GEN: Well nourished, well developed in no acute distress NECK: No JVD; No carotid bruits CARDIAC: ***RRR, no murmurs, rubs,  gallops RESPIRATORY:  Clear to auscultation without rales, wheezing or rhonchi  ABDOMEN: Soft, non-tender, non-distended EXTREMITIES:  No edema; No acute deformity     Asessement and Plan:.     ***     Disposition: F/u with ***  Signed, Rip Harbour, NP

## 2024-02-16 ENCOUNTER — Ambulatory Visit: Payer: No Typology Code available for payment source | Admitting: Cardiology

## 2024-02-16 DIAGNOSIS — I10 Essential (primary) hypertension: Secondary | ICD-10-CM

## 2024-02-16 DIAGNOSIS — E7800 Pure hypercholesterolemia, unspecified: Secondary | ICD-10-CM

## 2024-02-16 DIAGNOSIS — I5022 Chronic systolic (congestive) heart failure: Secondary | ICD-10-CM

## 2024-02-16 DIAGNOSIS — I428 Other cardiomyopathies: Secondary | ICD-10-CM

## 2024-04-01 ENCOUNTER — Ambulatory Visit: Admitting: Cardiology

## 2024-04-01 ENCOUNTER — Ambulatory Visit: Admitting: Pharmacist

## 2024-05-20 ENCOUNTER — Ambulatory Visit: Admitting: Pharmacist

## 2024-05-27 ENCOUNTER — Ambulatory Visit: Admitting: Cardiology

## 2024-06-29 ENCOUNTER — Ambulatory Visit: Admitting: Pharmacist

## 2024-06-29 NOTE — Progress Notes (Deleted)
 Patient ID: Andres Roman                 DOB: 1966/08/29                    MRN: [id number]     HPI: [name] is a 58 y.o. male patient referred to lipid clinic by ***. PMH is significant for   Current Medications:  Intolerances:  Risk Factors:  LDL goal:   Diet:   Exercise:   Family History:   Social History:   Labs:  Past Medical History:  Diagnosis Date   Acute systolic CHF (congestive heart failure) (HCC) 03/26/2021   CKD (chronic kidney disease) stage 3, GFR 30-59 ml/min (HCC)    Hyperlipidemia    Hypertension    Obese    Pulmonary embolism (HCC)     Current Outpatient Medications on File Prior to Visit  Medication Sig Dispense Refill   carvedilol  (COREG ) 6.25 MG tablet Take 1 tablet (6.25 mg total) by mouth 2 (two) times daily. 180 tablet 3   dapagliflozin  propanediol (FARXIGA ) 10 MG TABS tablet Take 1 tablet (10 mg total) by mouth daily before breakfast. 90 tablet 3   furosemide  (LASIX ) 20 MG tablet Take 1 tablet (20 mg total) by mouth daily as needed for fluid or edema. 90 tablet 1   pantoprazole  (PROTONIX ) 40 MG tablet Take 1 tablet (40 mg total) by mouth daily. (Patient not taking: Reported on 01/17/2023) 90 tablet 1   rosuvastatin  (CRESTOR ) 40 MG tablet Take 1 tablet (40 mg total) by mouth daily. 90 tablet 1   sacubitril -valsartan  (ENTRESTO ) 97-103 MG Take 1 tablet by mouth 2 (two) times daily. 180 tablet 3   spironolactone  (ALDACTONE ) 25 MG tablet Take 1 tablet (25 mg total) by mouth daily. 90 tablet 1   No current facility-administered medications on file prior to visit.    No Known Allergies  Assessment/Plan:  1. Hyperlipidemia -  Reviewed options for lowering LDL cholesterol, including statins, ezetimibe, PCSK9 inhibitors, Nexletol/Nexlizet, and Leqvio. Discussed efficacy, dosing, side effects, and copay information.

## 2024-07-07 ENCOUNTER — Ambulatory Visit: Admitting: Cardiology

## 2024-08-05 ENCOUNTER — Ambulatory Visit: Admitting: Pharmacist

## 2024-08-05 ENCOUNTER — Ambulatory Visit: Admitting: Cardiology

## 2024-09-13 ENCOUNTER — Ambulatory Visit: Admitting: Cardiology

## 2024-09-13 ENCOUNTER — Ambulatory Visit: Admitting: Pharmacist

## 2024-10-31 NOTE — Progress Notes (Deleted)
 "  Cardiology Office Note    Date:  10/31/2024  ID:  Andres Roman, DOB 05-05-1966, MRN [id number] PCP:  Theotis Haze ORN, NP  Cardiologist:  Darryle ONEIDA Decent, MD  Electrophysiologist:  None   Chief Complaint: ***  History of Present Illness: Andres Roman   Andres Roman is a 58 y.o. male with visit-pertinent history of nonobstructive CAD, hypertension, hyperlipidemia, PE ,chronic systolic heart failure with most recent echocardiogram on 04/29/2022 revealing normal LVEF of 50 to 55%, mild to moderate aortic valve regurgitation, hypertension, hyperlipidemia, and PE.   In May 2022 he was diagnosed with congestive heart failure, left heart catheterization showed nonobstructive CAD.  It was felt that this heart failure was attributed to hypertension.  He was diagnosed in June 2022 with pulmonary embolism after his heart failure hospitalization, he had completed his Eliquis .  He was last seen by Dr. Decent on 7/3 13/2023.  His carvedilol  and Entresto  was increased.  His last office visit on 01/17/2023 he was given refills on Farxiga  and spironolactone  as he had not been taking it.  He had otherwise remained stable from a cardiac standpoint.  Patient was seen in clinic on 11/27/2023.  He reports that he is doing very well overall.  He was requesting refills however was unsure of what medications he had been taking.  On chart review on 2/14 patient presented to [facility] with hematuria, found to be hypertensive with blood pressure of 201/124.   Patient was last in clinic on 01/05/2024, he reported that he was doing well.  He denied any further blood in his urine.  Patient was started on spironolactone  25 mg daily.  Today he presents for follow-up.  He reports that he  HTN: Blood pressure today Patient reports that his home blood pressure log Continue carvedilol  6.25 mg twice daily, Farxiga  10 mg daily, Entresto  97-23 mg twice daily and spironolactone  25 mg daily.  Check c-Met.  Chronic systolic heart failure:  Previously diagnosed with CHF in May 2022, cardiac catheterization at that time showed nonobstructive CAD.  It is felt that his heart failure was attributed to hypertension, follow-up echo in 04/2022 indicated LVEF of 50 to 55%.  Patient has had a questional adherence to medications, again stressed importance of adherence to medications with patient. Today he appears euvolemic and well compensated on exam.  Nonobstructive CAD: Cardiac catheterization in May 2022 showed nonobstructive CAD.  Encouraged to continue secondary prevention with blood pressure control, statin therapy, weight loss and purposeful exercise. Stable with no anginal symptoms. No indication for ischemic evaluation.    Hyperlipidemia: Last lipid profile on 01/15/2024 indicated total cholesterol 158, HDL 38, triglycerides 104 and LDL 101.  Patient was referred to Pharm.[facility]. lipid clinic for better lipid control.  Labwork independently reviewed:   ROS: .   *** denies chest pain, shortness of breath, lower extremity edema, fatigue, palpitations, melena, hematuria, hemoptysis, diaphoresis, weakness, presyncope, syncope, orthopnea, and PND.  All other systems are reviewed and otherwise negative.  Studies Reviewed: Andres Roman    EKG:  EKG is ordered today, personally reviewed, demonstrating ***     CV Studies: Cardiac studies reviewed are outlined and summarized above. Otherwise please see EMR for full report. Cardiac Studies & Procedures   ______________________________________________________________________________________________ CARDIAC CATHETERIZATION  CARDIAC CATHETERIZATION 03/28/2021  Conclusion  Prox RCA lesion is 20% stenosed.  RPAV lesion is 20% stenosed.  Dist RCA lesion is 20% stenosed.  Ost Cx to Mid Cx lesion is 20% stenosed.  Ramus lesion is 30% stenosed.  1st  Mrg lesion is 50% stenosed.  1.  Mild nonobstructive coronary artery disease. 2.  Right heart catheterization showed low filling pressures, normal  pulmonary pressure and normal cardiac output.  No evidence of significant mitral regurgitation based on waveforms.  Recommendations: The patient has nonischemic cardiomyopathy.  Recommend medical therapy. He seems to be mildly volume depleted and mildly hypotensive.  He was given 250 mill bolus of normal saline.  Hold furosemide  today.  I also held hydralazine  and Imdur  for the same reason and elected to add small dose carvedilol  3.125 mg twice daily.  He is already on Entresto , Farxiga  and spironolactone .  Findings Coronary Findings Diagnostic  Dominance: Right  Left Main The vessel exhibits minimal luminal irregularities.  Left Anterior Descending There is mild diffuse disease throughout the vessel.  Ramus Intermedius Ramus lesion is 30% stenosed.  Left Circumflex Ost Cx to Mid Cx lesion is 20% stenosed.  First Obtuse Marginal Branch 1st Mrg lesion is 50% stenosed.  Right Coronary Artery Prox RCA lesion is 20% stenosed. Dist RCA lesion is 20% stenosed.  Right Posterior Atrioventricular Artery RPAV lesion is 20% stenosed.  Intervention  No interventions have been documented.     ECHOCARDIOGRAM  ECHOCARDIOGRAM COMPLETE 04/29/2022  Narrative ECHOCARDIOGRAM REPORT    Patient Name:   Andres Roman Date of Exam: 04/29/2022 Medical Rec #:  [id number]     Height:       67.0 in Accession #:    [id number]    Weight:       252.0 lb Date of Birth:  Apr 01, 1966     BSA:          2.231 m Patient Age:    56 years      BP:           160/90 mmHg Patient Gender: M             HR:           69 bpm. Exam Location:  [address]  Procedure: 2D Echo, 3D Echo, Cardiac Doppler, Color Doppler and Strain Analysis  Indications:    I50.22 CHF  History:        Patient has prior history of Echocardiogram examinations, most recent 04/09/2021. CHF and Cardiomyopathy; Risk Factors:Hypertension and Dyslipidemia. Pulmonary embolus. CKD stage 3. Pre-diabetes.  Sonographer:    Marshia Lawyer  BS, RDCS Referring Phys: [id number] DARRYLE NED O'NEAL  IMPRESSIONS   1. The aortic valve is tricuspid. There is mild thickening of the aortic valve. Aortic valve regurgitation is eccentric with prominent splay artifact. Aortic valve regurgitation appears mild to moderate, however with eccentric component may be moderate and underestimated. See Recommendations. No aortic stenosis is present. 2. Left ventricular ejection fraction, by estimation, is 50 to 55%. The left ventricle has low normal function. The left ventricle has no definite regional wall motion abnormalities. There is mild left ventricular hypertrophy. Left ventricular diastolic parameters were low normal for age. The average left ventricular global longitudinal strain is -21.9 %. The global longitudinal strain is normal. 3. Right ventricular systolic function is normal. The right ventricular size is normal. Tricuspid regurgitation signal is inadequate for assessing PA pressure. 4. The mitral valve is normal in structure. Mild mitral valve regurgitation. No evidence of mitral stenosis. 5. The inferior vena cava is normal in size with greater than 50% respiratory variability, suggesting right atrial pressure of 3 mmHg.  Comparison(s): A prior study was performed on 04/09/21. Prior images reviewed side by side. LVEF has improved.  Conclusion(s)/Recommendation(s): Aortic  valve regurgitation may have increased, with prominent eccentric component on this exam compared to prior. BP noted to be 160/90 mmHg. LV remains normal size and now with recovered LV function. Consider cardiac MRI vs TEE for quantitation of eccentric aortic valve regurgitation when BP optimized.  FINDINGS Left Ventricle: Left ventricular ejection fraction, by estimation, is 50 to 55%. The left ventricle has low normal function. The left ventricle has no regional wall motion abnormalities. The average left ventricular global longitudinal strain is -21.9 %. The global  longitudinal strain is normal. 3D left ventricular ejection fraction analysis performed but not reported based on interpreter judgement due to suboptimal tracking. The left ventricular internal cavity size was normal in size. There is mild left ventricular hypertrophy. Left ventricular diastolic parameters were normal.  Right Ventricle: The right ventricular size is normal. No increase in right ventricular wall thickness. Right ventricular systolic function is normal. Tricuspid regurgitation signal is inadequate for assessing PA pressure.  Left [facility]: Left atrial size was normal in size.  Right [facility]: Right atrial size was normal in size.  Pericardium: There is no evidence of pericardial effusion.  Mitral Valve: The mitral valve is normal in structure. Mild mitral valve regurgitation. No evidence of mitral valve stenosis.  Tricuspid Valve: The tricuspid valve is normal in structure. Tricuspid valve regurgitation is not demonstrated. No evidence of tricuspid stenosis.  Aortic Valve: The aortic valve is tricuspid. There is mild thickening of the aortic valve. Aortic valve regurgitation is mild to moderate. Aortic regurgitation PHT measures 859 msec. No aortic stenosis is present.  Pulmonic Valve: The pulmonic valve was grossly normal. Pulmonic valve regurgitation is trivial. No evidence of pulmonic stenosis.  Aorta: Asc Ao upper limit of normal for age indexed to BSA. The aortic root is normal in size and structure.  Venous: The inferior vena cava is normal in size with greater than 50% respiratory variability, suggesting right atrial pressure of 3 mmHg.  IAS/Shunts: No atrial level shunt detected by color flow Doppler.   LEFT VENTRICLE PLAX 2D LVIDd:         4.70 cm   Diastology LVIDs:         3.40 cm   LV e' medial:    6.53 cm/s LV PW:         1.20 cm   LV E/e' medial:  7.3 LV IVS:        1.10 cm   LV e' lateral:   8.70 cm/s LVOT diam:     2.40 cm   LV E/e' lateral: 5.5 LV SV:          70 LV SV Index:   31        2D Longitudinal Strain LVOT Area:     4.52 cm  2D Strain GLS (A2C):   -20.6 % 2D Strain GLS (A3C):   -24.7 % 2D Strain GLS (A4C):   -20.4 % 2D Strain GLS Avg:     -21.9 %  3D Volume EF: 3D EF:        57 % LV EDV:       180 ml LV ESV:       78 ml LV SV:        102 ml  RIGHT VENTRICLE            IVC RV Basal diam:  3.90 cm    IVC diam: 1.09 cm RV S prime:     9.83 cm/s TAPSE (M-mode): 1.9 cm  LEFT ATRIUM  Index        RIGHT ATRIUM           Index LA diam:        4.50 cm 2.02 cm/m   RA Pressure: 3.00 mmHg LA Vol (A2C):   49.6 ml 22.23 ml/m  RA Area:     16.60 cm LA Vol (A4C):   46.7 ml 20.93 ml/m  RA Volume:   45.30 ml  20.31 ml/m LA Biplane Vol: 48.1 ml 21.56 ml/m AORTIC VALVE LVOT Vmax:   74.50 cm/s LVOT Vmean:  47.900 cm/s LVOT VTI:    0.155 m AI PHT:      859 msec  AORTA Ao Root diam: 3.60 cm Ao Asc diam:  3.90 cm  MITRAL VALVE               TRICUSPID VALVE Estimated RAP:  3.00 mmHg MV Decel Time: 257 msec MV E velocity: 47.60 cm/s  SHUNTS MV A velocity: 44.70 cm/s  Systemic VTI:  0.16 m MV E/A ratio:  1.06        Systemic Diam: 2.40 cm  Soyla Merck MD Electronically signed by Soyla Merck MD Signature Date/Time: 04/29/2022/10:33:04 AM    Final          ______________________________________________________________________________________________       Current Reported Medications:.    Active Medications[1]  Physical Exam:    VS:  There were no vitals taken for this visit.   Wt Readings from Last 3 Encounters:  01/05/24 243 lb 12.8 oz (110.6 kg)  11/27/23 249 lb (112.9 kg)  01/17/23 247 lb 12.8 oz (112.4 kg)    GEN: Well nourished, well developed in no acute distress NECK: No JVD; No carotid bruits CARDIAC: ***RRR, no murmurs, rubs, gallops RESPIRATORY:  Clear to auscultation without rales, wheezing or rhonchi  ABDOMEN: Soft, non-tender, non-distended EXTREMITIES:  No edema; No acute  deformity     Asessement and Plan:.     ***     Disposition: F/u with ***  Signed, Linda Biehn D Cynde Menard, NP      [1]  No outpatient medications have been marked as taking for the 11/01/24 encounter (Appointment) with Loreley Schwall D, NP.   "

## 2024-11-01 ENCOUNTER — Ambulatory Visit: Admitting: Pharmacist

## 2024-11-01 ENCOUNTER — Ambulatory Visit: Admitting: Cardiology

## 2024-11-01 DIAGNOSIS — E7800 Pure hypercholesterolemia, unspecified: Secondary | ICD-10-CM

## 2024-11-01 DIAGNOSIS — I5022 Chronic systolic (congestive) heart failure: Secondary | ICD-10-CM

## 2024-11-01 DIAGNOSIS — I2510 Atherosclerotic heart disease of native coronary artery without angina pectoris: Secondary | ICD-10-CM

## 2024-11-01 DIAGNOSIS — I428 Other cardiomyopathies: Secondary | ICD-10-CM

## 2024-11-01 DIAGNOSIS — I10 Essential (primary) hypertension: Secondary | ICD-10-CM

## 2024-11-01 NOTE — Progress Notes (Deleted)
 Patient ID: Andres Roman                 DOB: 1966/09/21                    MRN: [id number]      HPI: Andres Roman is a 58 y.o. male patient of Dr. Barbaraann referred to lipid clinic by Andres West, NP. PMH is significant for nonobstructive CAD, hypertension, hyperlipidemia, PE ,chronic systolic heart failure with most recent echocardiogram on 04/29/2022 revealing normal LVEF of 50 to 55%, mild to moderate aortic valve regurgitation.    2 years ago patient LDL-C was 180. In March, LDL-C was down to 101 on rosuvastatin  40mg  daily. Compliance? BP?    Reviewed options for lowering LDL cholesterol, including ezetimibe, PCSK-9 inhibitors, bempedoic acid and inclisiran.  Discussed mechanisms of action, dosing, side effects and potential decreases in LDL cholesterol.  Also reviewed cost information and potential options for patient assistance.   Current Medications:  Intolerances:  Risk Factors:  LDL-C goal:  ApoB goal:   Diet:   Exercise:   Family History:   Social History:   Labs: Lipid Panel     Component Value Date/Time   CHOL 158 01/15/2024 1100   TRIG 104 01/15/2024 1100   HDL 38 (L) 01/15/2024 1100   CHOLHDL 4.2 01/15/2024 1100   CHOLHDL 6.0 03/26/2021 0919   VLDL 19 03/26/2021 0919   LDLCALC 101 (H) 01/15/2024 1100   LABVLDL 19 01/15/2024 1100    Past Medical History:  Diagnosis Date   Acute systolic CHF (congestive heart failure) (HCC) 03/26/2021   CKD (chronic kidney disease) stage 3, GFR 30-59 ml/min (HCC)    Hyperlipidemia    Hypertension    Obese    Pulmonary embolism (HCC)     Medications Ordered Prior to Encounter[1]  Allergies[2]  Assessment/Plan:  1. Hyperlipidemia -  No problem-specific Assessment & Plan notes found for this encounter.    Thank you,  Andres Roman D Andres Roman, Pharm.Andres Roman, CPP [facility] of [facility] [address] [place], [place] [address]  Phone: [phone]; Fax: [phone]            [1]  Current Outpatient Medications on File Prior to Visit  Medication Sig Dispense Refill   carvedilol  (COREG ) 6.25 MG tablet Take 1 tablet (6.25 mg total) by mouth 2 (two) times daily. 180 tablet 3   dapagliflozin  propanediol (FARXIGA ) 10 MG TABS tablet Take 1 tablet (10 mg total) by mouth daily before breakfast. 90 tablet 3   furosemide  (LASIX ) 20 MG tablet Take 1 tablet (20 mg total) by mouth daily as needed for fluid or edema. 90 tablet 1   pantoprazole  (PROTONIX ) 40 MG tablet Take 1 tablet (40 mg total) by mouth daily. (Patient not taking: Reported on 01/17/2023) 90 tablet 1   rosuvastatin  (CRESTOR ) 40 MG tablet Take 1 tablet (40 mg total) by mouth daily. 90 tablet 1   sacubitril -valsartan  (ENTRESTO ) 97-103 MG Take 1 tablet by mouth 2 (two) times daily. 180 tablet 3   spironolactone  (ALDACTONE ) 25 MG tablet Take 1 tablet (25 mg total) by mouth daily. 90 tablet 1   No current facility-administered medications on file prior to visit.  [2] No Known Allergies

## 2024-12-21 ENCOUNTER — Ambulatory Visit: Admitting: Pharmacist

## 2024-12-21 ENCOUNTER — Ambulatory Visit: Admitting: Cardiology

## 2025-01-28 ENCOUNTER — Ambulatory Visit: Admitting: Pharmacist

## 2025-02-22 ENCOUNTER — Ambulatory Visit: Admitting: Cardiology
